# Patient Record
Sex: Male | Born: 1950 | Race: White | Hispanic: No | Marital: Married | Smoking: Never smoker
Health system: Southern US, Community
[De-identification: ages and names within clinical notes are randomized; demographics above are authoritative.]

## PROBLEM LIST (undated history)

## (undated) DIAGNOSIS — H269 Unspecified cataract: Secondary | ICD-10-CM

## (undated) DIAGNOSIS — J302 Other seasonal allergic rhinitis: Secondary | ICD-10-CM

## (undated) DIAGNOSIS — T7840XA Allergy, unspecified, initial encounter: Secondary | ICD-10-CM

## (undated) DIAGNOSIS — Z85528 Personal history of other malignant neoplasm of kidney: Secondary | ICD-10-CM

## (undated) DIAGNOSIS — G4730 Sleep apnea, unspecified: Secondary | ICD-10-CM

## (undated) DIAGNOSIS — M1990 Unspecified osteoarthritis, unspecified site: Secondary | ICD-10-CM

## (undated) DIAGNOSIS — C801 Malignant (primary) neoplasm, unspecified: Secondary | ICD-10-CM

## (undated) DIAGNOSIS — N400 Enlarged prostate without lower urinary tract symptoms: Secondary | ICD-10-CM

## (undated) DIAGNOSIS — N189 Chronic kidney disease, unspecified: Secondary | ICD-10-CM

## (undated) DIAGNOSIS — I10 Essential (primary) hypertension: Secondary | ICD-10-CM

## (undated) DIAGNOSIS — F32A Depression, unspecified: Secondary | ICD-10-CM

## (undated) DIAGNOSIS — K219 Gastro-esophageal reflux disease without esophagitis: Secondary | ICD-10-CM

## (undated) DIAGNOSIS — F419 Anxiety disorder, unspecified: Secondary | ICD-10-CM

## (undated) HISTORY — DX: Anxiety disorder, unspecified: F41.9

## (undated) HISTORY — DX: Personal history of other malignant neoplasm of kidney: Z85.528

## (undated) HISTORY — PX: RENAL CRYOABLATION: SHX2322

## (undated) HISTORY — DX: Malignant (primary) neoplasm, unspecified: C80.1

## (undated) HISTORY — DX: Essential (primary) hypertension: I10

## (undated) HISTORY — DX: Gastro-esophageal reflux disease without esophagitis: K21.9

## (undated) HISTORY — DX: Unspecified osteoarthritis, unspecified site: M19.90

## (undated) HISTORY — PX: TONSILLECTOMY: SUR1361

## (undated) HISTORY — DX: Chronic kidney disease, unspecified: N18.9

## (undated) HISTORY — DX: Other seasonal allergic rhinitis: J30.2

## (undated) HISTORY — DX: Unspecified cataract: H26.9

## (undated) HISTORY — DX: Sleep apnea, unspecified: G47.30

## (undated) HISTORY — DX: Benign prostatic hyperplasia without lower urinary tract symptoms: N40.0

## (undated) HISTORY — DX: Depression, unspecified: F32.A

## (undated) HISTORY — DX: Allergy, unspecified, initial encounter: T78.40XA

---
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
## 2011-04-12 DIAGNOSIS — J4540 Moderate persistent asthma, uncomplicated: Secondary | ICD-10-CM | POA: Insufficient documentation

## 2011-04-12 DIAGNOSIS — R002 Palpitations: Secondary | ICD-10-CM | POA: Insufficient documentation

## 2011-04-12 DIAGNOSIS — I10 Essential (primary) hypertension: Secondary | ICD-10-CM | POA: Insufficient documentation

## 2011-04-27 ENCOUNTER — Encounter: Payer: Self-pay | Admitting: Critical Care Medicine

## 2011-04-28 ENCOUNTER — Ambulatory Visit (INDEPENDENT_AMBULATORY_CARE_PROVIDER_SITE_OTHER)
Admission: RE | Admit: 2011-04-28 | Discharge: 2011-04-28 | Disposition: A | Discharge status: Home / Self Care | Payer: BC Managed Care – PPO | Source: Ambulatory Visit | Attending: Critical Care Medicine | Admitting: Critical Care Medicine

## 2011-04-28 ENCOUNTER — Telehealth: Payer: Self-pay | Admitting: *Deleted

## 2011-04-28 ENCOUNTER — Ambulatory Visit (INDEPENDENT_AMBULATORY_CARE_PROVIDER_SITE_OTHER): Payer: BC Managed Care – PPO | Admitting: Critical Care Medicine

## 2011-04-28 ENCOUNTER — Encounter: Payer: Self-pay | Admitting: Critical Care Medicine

## 2011-04-28 DIAGNOSIS — I10 Essential (primary) hypertension: Secondary | ICD-10-CM

## 2011-04-28 DIAGNOSIS — N400 Enlarged prostate without lower urinary tract symptoms: Secondary | ICD-10-CM

## 2011-04-28 DIAGNOSIS — R911 Solitary pulmonary nodule: Secondary | ICD-10-CM

## 2011-04-28 DIAGNOSIS — J45909 Unspecified asthma, uncomplicated: Secondary | ICD-10-CM

## 2011-04-28 DIAGNOSIS — J302 Other seasonal allergic rhinitis: Secondary | ICD-10-CM | POA: Insufficient documentation

## 2011-04-28 DIAGNOSIS — J309 Allergic rhinitis, unspecified: Secondary | ICD-10-CM

## 2011-04-28 MED ORDER — BUDESONIDE-FORMOTEROL FUMARATE 160-4.5 MCG/ACT IN AERO: 2.0000 | INHALATION_SPRAY | Freq: Two times a day (BID) | RESPIRATORY_TRACT | Status: DC

## 2011-04-28 MED ORDER — LOSARTAN POTASSIUM 50 MG PO TABS: 50.0000 mg | ORAL_TABLET | Freq: Every day | ORAL | Status: DC

## 2011-04-28 NOTE — Telephone Encounter (Signed)
Call report: CXR: Vague nodular opacity in lung bases on lateral image likely left lower lob. Enlarged central pulmonary arteries particularly involving upper lobes bc of these findings; CT of chest w/ contrast is recommended. Please advise Dr. Delford Field, thanks

## 2011-04-28 NOTE — Patient Instructions (Signed)
Chest xray today Pulmonary function studies will be obtained Stop Qvar Start symbicort Stop lisinopril Start losartan Stop fish oil Get your outside records from [place] in [place] Return 6 weeks

## 2011-04-28 NOTE — Progress Notes (Signed)
Subjective:    Patient ID: Jay Fox, male    DOB: 07-Feb-1951, 61 y.o.   MRN: [id number]  HPI Comments: Dx asthma.  First time in early 1990s.  HAd sawdust exposure.  Left job after two years and did better. 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  Then in 2009 moved to [place] asthma was worse. Went to [facility] MD.  Starting 2011: giving albuterol nebs in office.  Then went to pulm in [place]. Cxr neg.  No desat on .  Cleda Daub was abn.  Dx with small airways dz.   Also prior chemical company acid exposure in 1980s.  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 Moved  May 2012 and is better since moved. Has had allergy testing and shots given:  Grasses/ weeds. Now referred d/t in 11/12: started with hard to get air into the chest area.  No relief with inhaler.   Asthma He complains of cough, difficulty breathing, frequent throat clearing, shortness of breath, sputum production and wheezing. There is no chest tightness, hemoptysis or hoarse voice. Primary symptoms comments: Mucus is white. This is a chronic problem. The current episode started more than 1 year ago. The problem occurs every several days. The problem has been gradually worsening. The cough is productive of sputum, dry and hoarse. Associated symptoms include dyspnea on exertion, ear pain, heartburn, postnasal drip and rhinorrhea. Pertinent negatives include no appetite change, chest pain, ear congestion, fever, headaches, myalgias, orthopnea, PND, sneezing, sore throat or trouble swallowing. His symptoms are aggravated by exposure to fumes, exposure to smoke, occupational exposure, pollen, exercise, any activity and change in weather. His symptoms are alleviated by beta-agonist and steroid inhaler. He reports moderate improvement on treatment. His past medical history is significant for asthma. There is no history of bronchiectasis, bronchitis, COPD, emphysema or pneumonia.   Past Medical History  Diagnosis Date  . Asthma   . Hypertension   . BPH (benign  prostatic hypertrophy)   . Seasonal allergies      Family History  Problem Relation Age of Onset  . Emphysema Mother   . Liver cancer Mother   . Pancreatic cancer Mother   . Multiple myeloma Father      History   Social History  . Marital Status: Married    Spouse Name: N/A    Number of Children: 3  . Years of Education: N/A   Occupational History  . Retired    Social History Main Topics  . Smoking status: Never Smoker   . Smokeless tobacco: Never Used  . Alcohol Use: Yes     2 drinks 4-5 times weekly  . Drug Use: No  . Sexually Active: Not on file   Other Topics Concern  . Not on file   Social History Narrative  . No narrative on file     No Known Allergies   Outpatient Prescriptions Prior to Visit  Medication Sig Dispense Refill  . albuterol (PROVENTIL HFA) 108 (90 BASE) MCG/ACT inhaler Inhale 2 puffs into the lungs every 6 (six) hours as needed.      Marland Kitchen amLODipine (NORVASC) 5 MG tablet Take 5 mg by mouth daily.      . clonazePAM (KLONOPIN) 0.5 MG tablet Take 0.5 mg by mouth at bedtime as needed.      . beclomethasone (QVAR) 80 MCG/ACT inhaler Inhale 1 puff into the lungs 2 (two) times daily.       Marland Kitchen lisinopril (PRINIVIL,ZESTRIL) 10 MG tablet Take 10 mg by mouth daily.  Review of Systems  Constitutional: Positive for diaphoresis and fatigue. Negative for fever, chills, activity change, appetite change and unexpected weight change.  HENT: Positive for ear pain, congestion, rhinorrhea, neck pain, neck stiffness, postnasal drip, sinus pressure and tinnitus. Negative for hearing loss, nosebleeds, sore throat, hoarse voice, facial swelling, sneezing, mouth sores, trouble swallowing, dental problem, voice change and ear discharge.   Eyes: Positive for itching. Negative for photophobia, discharge and visual disturbance.  Respiratory: Positive for cough, sputum production, chest tightness, shortness of breath and wheezing. Negative for apnea, hemoptysis,  choking and stridor.   Cardiovascular: Positive for dyspnea on exertion and palpitations. Negative for chest pain, leg swelling and PND.  Gastrointestinal: Positive for heartburn. Negative for nausea, vomiting, abdominal pain, constipation, blood in stool and abdominal distention.  Genitourinary: Positive for urgency, frequency and decreased urine volume. Negative for dysuria, hematuria, flank pain and difficulty urinating.  Musculoskeletal: Positive for back pain and arthralgias. Negative for myalgias and gait problem.  Skin: Negative for color change, pallor and rash.  Neurological: Positive for dizziness and light-headedness. Negative for tremors, seizures, syncope, speech difficulty, weakness, numbness and headaches.  Hematological: Negative for adenopathy. Does not bruise/bleed easily.  Psychiatric/Behavioral: Positive for sleep disturbance and agitation. Negative for confusion. The patient is nervous/anxious.        Objective:   Physical Exam  Filed Vitals:   04/28/11 1148  BP: 144/88  Pulse: 66  Temp: 98.1 F (36.7 C)  TempSrc: Oral  Height: 6\' 10"  (2.083 m)  Weight: 168 lb 12.8 oz (76.567 kg)  SpO2: 99%    Gen: Pleasant, well-nourished, in no distress,  normal affect  ENT: No lesions,  mouth clear,  oropharynx clear, no postnasal drip  Neck: No JVD, no TMG, no carotid bruits  Lungs: No use of accessory muscles, no dullness to percussion, distant BS  Cardiovascular: RRR, heart sounds normal, no murmur or gallops, no peripheral edema  Abdomen: soft and NT, no HSM,  BS normal  Musculoskeletal: No deformities, no cyanosis or clubbing  Neuro: alert, non focal  Skin: Warm, no lesions or rashes  Dg Chest 2 View  04/28/2011  *RADIOLOGY REPORT*  Clinical Data: History of asthma and hypertension, presenting with cough.  CHEST - 2 VIEW 04/28/2011:  Comparison: None.  Findings: Cardiac silhouette normal in size.  Prominent central pulmonary arteries bilaterally, particularly  in the upper lobes. Linear scar or atelectasis in the left lower lobe.  Vague nodular opacity visualized at the base on the lateral image, probably within the left lower lobe on the frontal image.  Lungs otherwise clear.  No pleural effusions.  Degenerative changes involving the mid thoracic spine.  IMPRESSION:  1.  Vague nodular opacity in one of the lung bases on the lateral image, likely the left lower lobe. 2.  Enlarged central pulmonary arteries, particularly involving the upper lobes.  Because of the above findings, CT of the chest with contrast is suggested in further evaluation.  These results will be called to the ordering clinician or representative by the Radiologist Assistant, and communication documented in the PACS Dashboard.  Original Report Authenticated By: Arnell Sieving, M.D.         Assessment & Plan:   Asthma Severe persistent asthma, incomplete database d/t former pulmonary MD in [place] Cyclic cough component d/t to upper airway instability ppt by ACE inhibitor use , fish oil, GERD Plan Pulmonary function studies will be obtained Stop Qvar Start symbicort Stop lisinopril Start losartan Stop fish oil Get your  outside records from [place] in [place] Return 6 weeks    Note Abn CXR , prob confluence vascular markings  ? Why pulm artery dilated  ? pulm HTN  Due to prior living at altitude? Obtain CT chest  May need echo.  Updated Medication List Outpatient Encounter Prescriptions as of 04/28/2011  Medication Sig Dispense Refill  . albuterol (PROVENTIL HFA) 108 (90 BASE) MCG/ACT inhaler Inhale 2 puffs into the lungs every 6 (six) hours as needed.      Marland Kitchen amLODipine (NORVASC) 5 MG tablet Take 5 mg by mouth daily.      . Ascorbic Acid (VITAMIN C PO) Take 1-2 tablets by mouth 2 (two) times daily.      . budesonide-formoterol (SYMBICORT) 160-4.5 MCG/ACT inhaler Inhale 2 puffs into the lungs 2 (two) times daily.  1 Inhaler  12  . Cholecalciferol (VITAMIN D PO) Take 1  tablet by mouth daily.      . clonazePAM (KLONOPIN) 0.5 MG tablet Take 0.5 mg by mouth at bedtime as needed.      . Cyanocobalamin (VITAMIN B-12 PO) Take 1 tablet by mouth daily.      Marland Kitchen losartan (COZAAR) 50 MG tablet Take 1 tablet (50 mg total) by mouth daily.  30 tablet  6  . Melatonin 5 MG TABS Take 1 tablet by mouth at bedtime.      . Multiple Vitamin (MULTIVITAMIN) tablet Take 1 tablet by mouth daily.      . Saw Palmetto 450 MG CAPS Take 3 capsules by mouth 2 (two) times daily.      Marland Kitchen Spacer/Aero-Holding Chambers (AEROCHAMBER MV) inhaler by Other route. Use as instructed      . VALERIAN ROOT PO Take 2 capsules by mouth at bedtime.      Marland Kitchen DISCONTD: beclomethasone (QVAR) 80 MCG/ACT inhaler Inhale 1 puff into the lungs 2 (two) times daily.       Marland Kitchen DISCONTD: lisinopril (PRINIVIL,ZESTRIL) 10 MG tablet Take 10 mg by mouth daily.      Marland Kitchen DISCONTD: Omega-3 Fatty Acids (FISH OIL) 1000 MG CAPS Take 1 capsule by mouth 2 (two) times daily.

## 2011-04-29 ENCOUNTER — Other Ambulatory Visit (INDEPENDENT_AMBULATORY_CARE_PROVIDER_SITE_OTHER): Payer: BC Managed Care – PPO

## 2011-04-29 DIAGNOSIS — J45909 Unspecified asthma, uncomplicated: Secondary | ICD-10-CM

## 2011-04-29 LAB — BASIC METABOLIC PANEL
BUN: 17 mg/dL (ref 6–23)
Chloride: 106 mEq/L (ref 96–112)
Creatinine, Ser: 1.1 mg/dL (ref 0.4–1.5)
Glucose, Bld: 83 mg/dL (ref 70–99)

## 2011-04-29 NOTE — Telephone Encounter (Signed)
Called, spoke with pt.  I informed him of CXR results and recs per PW. He verbalized understanding of this and is aware he will receive another call regarding the date/time of the CT Chest.    He asked I speak with wife regarding this as they have recently changed insurance companies and would like further information so they could call the insurance co to see how much they are going to have to pay for this. She would like the diagnosis code we are going to use - I showed the cxr report to Dr. Maple Hudson to see which code we should use as asthma is the only pulmonary dx in chart  Per CDY, use lung nodule.  I informed wife we will order this under the 793.11 dx code.    BMET and CT Chest orders have been placed.

## 2011-04-29 NOTE — Telephone Encounter (Signed)
Call pt and tell him there is a density in his Left lung, may just be blood vessels and we need to do a CT Chest Needs CT Chest with contrast  Check BMET

## 2011-04-30 NOTE — Assessment & Plan Note (Addendum)
Severe persistent asthma, incomplete database d/t former pulmonary MD in [place] Cyclic cough component d/t to upper airway instability ppt by ACE inhibitor use , fish oil, GERD Plan Pulmonary function studies will be obtained Stop Qvar Start symbicort Stop lisinopril Start losartan Stop fish oil Get your outside records from [place] in [place] Return 6 weeks

## 2011-05-04 ENCOUNTER — Ambulatory Visit (INDEPENDENT_AMBULATORY_CARE_PROVIDER_SITE_OTHER)
Admission: RE | Admit: 2011-05-04 | Discharge: 2011-05-04 | Disposition: A | Discharge status: Home / Self Care | Payer: BC Managed Care – PPO | Source: Ambulatory Visit | Attending: Critical Care Medicine | Admitting: Critical Care Medicine

## 2011-05-04 DIAGNOSIS — R911 Solitary pulmonary nodule: Secondary | ICD-10-CM

## 2011-05-04 MED ORDER — IOHEXOL 300 MG/ML  SOLN
80.0000 mL | Freq: Once | INTRAMUSCULAR | Status: AC | PRN
  Administered 2011-05-04: 80 mL via INTRAVENOUS

## 2011-05-06 ENCOUNTER — Telehealth: Payer: Self-pay | Admitting: *Deleted

## 2011-05-06 NOTE — Telephone Encounter (Signed)
Called, spoke with pt.  PFT was rescheduled for May 11, 2011 at 1 pm -- pt aware and will call back if this does not work.  Advised to please make sure, if he does have to reschedule it, that it will be within the next few weeks.  He verbalized understanding of this.

## 2011-05-06 NOTE — Telephone Encounter (Signed)
Message copied by Valentino Hue on Thu May 06, 2011 11:14 AM ------      Message from: Storm Frisk      Created: Tue May 04, 2011  2:41 PM       Leisel Pinette            This pts pfts not until April            This needs to be moved up sooner .   In next few weeks            pw

## 2011-05-11 ENCOUNTER — Ambulatory Visit (INDEPENDENT_AMBULATORY_CARE_PROVIDER_SITE_OTHER): Payer: BC Managed Care – PPO | Admitting: Critical Care Medicine

## 2011-05-11 ENCOUNTER — Telehealth: Payer: Self-pay | Admitting: Critical Care Medicine

## 2011-05-11 DIAGNOSIS — J45909 Unspecified asthma, uncomplicated: Secondary | ICD-10-CM

## 2011-05-11 LAB — PULMONARY FUNCTION TEST

## 2011-05-11 NOTE — Telephone Encounter (Signed)
I will have to call him sometime tomorrow I have not yet reviewed the studies

## 2011-05-11 NOTE — Telephone Encounter (Signed)
Spoke with pt and notified that results not yet reviewed yet, and that PW will call him sometime 05/12/11.

## 2011-05-11 NOTE — Telephone Encounter (Signed)
PFT results discussed with the patient

## 2011-05-11 NOTE — Telephone Encounter (Signed)
I spoke with pt and he is requesting his PFT results from today. I advised him will forward to Dr. Delford Field advising him of this. Please advise Dr. Delford Field, thanks

## 2011-05-11 NOTE — Progress Notes (Signed)
PFT done today. 

## 2011-05-12 ENCOUNTER — Encounter: Payer: Self-pay | Admitting: Critical Care Medicine

## 2011-05-21 ENCOUNTER — Encounter: Payer: Self-pay | Admitting: Critical Care Medicine

## 2011-06-15 ENCOUNTER — Ambulatory Visit: Payer: BC Managed Care – PPO | Admitting: Critical Care Medicine

## 2011-09-23 ENCOUNTER — Other Ambulatory Visit: Payer: Self-pay | Admitting: Critical Care Medicine

## 2011-09-24 NOTE — Telephone Encounter (Signed)
At last OV with Dr. Delford Field on 04/28/11, lisinopril was d/c'd.

## 2012-05-15 DIAGNOSIS — R32 Unspecified urinary incontinence: Secondary | ICD-10-CM | POA: Insufficient documentation

## 2012-05-15 DIAGNOSIS — L989 Disorder of the skin and subcutaneous tissue, unspecified: Secondary | ICD-10-CM | POA: Insufficient documentation

## 2012-09-08 DIAGNOSIS — G4700 Insomnia, unspecified: Secondary | ICD-10-CM | POA: Insufficient documentation

## 2017-03-15 HISTORY — PX: NASAL SINUS SURGERY: SHX719

## 2017-11-21 DIAGNOSIS — C61 Malignant neoplasm of prostate: Secondary | ICD-10-CM | POA: Insufficient documentation

## 2019-02-22 DIAGNOSIS — Z85528 Personal history of other malignant neoplasm of kidney: Secondary | ICD-10-CM | POA: Insufficient documentation

## 2019-12-17 ENCOUNTER — Other Ambulatory Visit: Payer: Self-pay

## 2019-12-17 ENCOUNTER — Ambulatory Visit
Admission: RE | Admit: 2019-12-17 | Discharge: 2019-12-17 | Disposition: A | Discharge status: Home / Self Care | Payer: Medicare Other | Source: Ambulatory Visit | Attending: Nurse Practitioner | Admitting: Nurse Practitioner

## 2019-12-17 ENCOUNTER — Other Ambulatory Visit: Payer: Self-pay | Admitting: Nurse Practitioner

## 2019-12-17 DIAGNOSIS — M25561 Pain in right knee: Secondary | ICD-10-CM

## 2020-04-12 DIAGNOSIS — G2581 Restless legs syndrome: Secondary | ICD-10-CM | POA: Diagnosis not present

## 2020-04-12 DIAGNOSIS — I10 Essential (primary) hypertension: Secondary | ICD-10-CM | POA: Diagnosis not present

## 2020-04-29 DIAGNOSIS — H2513 Age-related nuclear cataract, bilateral: Secondary | ICD-10-CM | POA: Diagnosis not present

## 2020-04-29 DIAGNOSIS — H33002 Unspecified retinal detachment with retinal break, left eye: Secondary | ICD-10-CM | POA: Diagnosis not present

## 2020-04-30 DIAGNOSIS — N5231 Erectile dysfunction following radical prostatectomy: Secondary | ICD-10-CM | POA: Diagnosis not present

## 2020-04-30 DIAGNOSIS — C61 Malignant neoplasm of prostate: Secondary | ICD-10-CM | POA: Diagnosis not present

## 2020-04-30 DIAGNOSIS — C642 Malignant neoplasm of left kidney, except renal pelvis: Secondary | ICD-10-CM | POA: Diagnosis not present

## 2020-04-30 DIAGNOSIS — N393 Stress incontinence (female) (male): Secondary | ICD-10-CM | POA: Diagnosis not present

## 2020-05-14 DIAGNOSIS — Z01812 Encounter for preprocedural laboratory examination: Secondary | ICD-10-CM | POA: Diagnosis not present

## 2020-05-15 DIAGNOSIS — K5730 Diverticulosis of large intestine without perforation or abscess without bleeding: Secondary | ICD-10-CM | POA: Diagnosis not present

## 2020-05-15 DIAGNOSIS — C642 Malignant neoplasm of left kidney, except renal pelvis: Secondary | ICD-10-CM | POA: Diagnosis not present

## 2020-05-19 DIAGNOSIS — K625 Hemorrhage of anus and rectum: Secondary | ICD-10-CM | POA: Diagnosis not present

## 2020-05-19 DIAGNOSIS — K635 Polyp of colon: Secondary | ICD-10-CM | POA: Diagnosis not present

## 2020-05-19 DIAGNOSIS — K5730 Diverticulosis of large intestine without perforation or abscess without bleeding: Secondary | ICD-10-CM | POA: Diagnosis not present

## 2020-05-19 DIAGNOSIS — K640 First degree hemorrhoids: Secondary | ICD-10-CM | POA: Diagnosis not present

## 2020-05-22 DIAGNOSIS — C61 Malignant neoplasm of prostate: Secondary | ICD-10-CM | POA: Diagnosis not present

## 2020-05-22 DIAGNOSIS — G2581 Restless legs syndrome: Secondary | ICD-10-CM | POA: Diagnosis not present

## 2020-05-22 DIAGNOSIS — N281 Cyst of kidney, acquired: Secondary | ICD-10-CM | POA: Diagnosis not present

## 2020-05-22 DIAGNOSIS — I10 Essential (primary) hypertension: Secondary | ICD-10-CM | POA: Diagnosis not present

## 2020-05-22 DIAGNOSIS — F411 Generalized anxiety disorder: Secondary | ICD-10-CM | POA: Diagnosis not present

## 2020-05-23 DIAGNOSIS — K635 Polyp of colon: Secondary | ICD-10-CM | POA: Diagnosis not present

## 2020-06-02 DIAGNOSIS — M25561 Pain in right knee: Secondary | ICD-10-CM | POA: Diagnosis not present

## 2020-06-12 DIAGNOSIS — G2581 Restless legs syndrome: Secondary | ICD-10-CM | POA: Diagnosis not present

## 2020-06-12 DIAGNOSIS — I10 Essential (primary) hypertension: Secondary | ICD-10-CM | POA: Diagnosis not present

## 2020-06-24 DIAGNOSIS — M6281 Muscle weakness (generalized): Secondary | ICD-10-CM | POA: Diagnosis not present

## 2020-06-24 DIAGNOSIS — R262 Difficulty in walking, not elsewhere classified: Secondary | ICD-10-CM | POA: Diagnosis not present

## 2020-06-24 DIAGNOSIS — M1711 Unilateral primary osteoarthritis, right knee: Secondary | ICD-10-CM | POA: Diagnosis not present

## 2020-06-24 DIAGNOSIS — M25561 Pain in right knee: Secondary | ICD-10-CM | POA: Diagnosis not present

## 2020-07-01 DIAGNOSIS — M1711 Unilateral primary osteoarthritis, right knee: Secondary | ICD-10-CM | POA: Diagnosis not present

## 2020-07-01 DIAGNOSIS — M25561 Pain in right knee: Secondary | ICD-10-CM | POA: Diagnosis not present

## 2020-07-01 DIAGNOSIS — R262 Difficulty in walking, not elsewhere classified: Secondary | ICD-10-CM | POA: Diagnosis not present

## 2020-07-01 DIAGNOSIS — M6281 Muscle weakness (generalized): Secondary | ICD-10-CM | POA: Diagnosis not present

## 2020-07-02 DIAGNOSIS — G4730 Sleep apnea, unspecified: Secondary | ICD-10-CM | POA: Diagnosis not present

## 2020-07-03 DIAGNOSIS — G4730 Sleep apnea, unspecified: Secondary | ICD-10-CM | POA: Diagnosis not present

## 2020-07-08 DIAGNOSIS — R262 Difficulty in walking, not elsewhere classified: Secondary | ICD-10-CM | POA: Diagnosis not present

## 2020-07-08 DIAGNOSIS — M1711 Unilateral primary osteoarthritis, right knee: Secondary | ICD-10-CM | POA: Diagnosis not present

## 2020-07-08 DIAGNOSIS — M6281 Muscle weakness (generalized): Secondary | ICD-10-CM | POA: Diagnosis not present

## 2020-07-16 DIAGNOSIS — M1711 Unilateral primary osteoarthritis, right knee: Secondary | ICD-10-CM | POA: Diagnosis not present

## 2020-07-16 DIAGNOSIS — R262 Difficulty in walking, not elsewhere classified: Secondary | ICD-10-CM | POA: Diagnosis not present

## 2020-07-16 DIAGNOSIS — M25561 Pain in right knee: Secondary | ICD-10-CM | POA: Diagnosis not present

## 2020-07-16 DIAGNOSIS — M6281 Muscle weakness (generalized): Secondary | ICD-10-CM | POA: Diagnosis not present

## 2020-09-11 DIAGNOSIS — G2581 Restless legs syndrome: Secondary | ICD-10-CM | POA: Diagnosis not present

## 2020-09-11 DIAGNOSIS — I10 Essential (primary) hypertension: Secondary | ICD-10-CM | POA: Diagnosis not present

## 2020-09-22 DIAGNOSIS — F518 Other sleep disorders not due to a substance or known physiological condition: Secondary | ICD-10-CM | POA: Diagnosis not present

## 2020-09-22 DIAGNOSIS — F411 Generalized anxiety disorder: Secondary | ICD-10-CM | POA: Diagnosis not present

## 2020-09-22 DIAGNOSIS — Z13220 Encounter for screening for lipoid disorders: Secondary | ICD-10-CM | POA: Diagnosis not present

## 2020-09-22 DIAGNOSIS — G2581 Restless legs syndrome: Secondary | ICD-10-CM | POA: Diagnosis not present

## 2020-09-22 DIAGNOSIS — I10 Essential (primary) hypertension: Secondary | ICD-10-CM | POA: Diagnosis not present

## 2020-10-12 DIAGNOSIS — G2581 Restless legs syndrome: Secondary | ICD-10-CM | POA: Diagnosis not present

## 2020-10-12 DIAGNOSIS — I10 Essential (primary) hypertension: Secondary | ICD-10-CM | POA: Diagnosis not present

## 2020-10-13 DIAGNOSIS — F518 Other sleep disorders not due to a substance or known physiological condition: Secondary | ICD-10-CM | POA: Diagnosis not present

## 2020-10-13 DIAGNOSIS — I10 Essential (primary) hypertension: Secondary | ICD-10-CM | POA: Diagnosis not present

## 2020-10-13 DIAGNOSIS — F411 Generalized anxiety disorder: Secondary | ICD-10-CM | POA: Diagnosis not present

## 2020-10-13 DIAGNOSIS — G2581 Restless legs syndrome: Secondary | ICD-10-CM | POA: Diagnosis not present

## 2020-10-20 DIAGNOSIS — D1801 Hemangioma of skin and subcutaneous tissue: Secondary | ICD-10-CM | POA: Diagnosis not present

## 2020-10-20 DIAGNOSIS — L905 Scar conditions and fibrosis of skin: Secondary | ICD-10-CM | POA: Diagnosis not present

## 2020-10-20 DIAGNOSIS — L578 Other skin changes due to chronic exposure to nonionizing radiation: Secondary | ICD-10-CM | POA: Diagnosis not present

## 2020-10-20 DIAGNOSIS — L821 Other seborrheic keratosis: Secondary | ICD-10-CM | POA: Diagnosis not present

## 2020-10-28 DIAGNOSIS — C61 Malignant neoplasm of prostate: Secondary | ICD-10-CM | POA: Diagnosis not present

## 2020-11-03 DIAGNOSIS — N281 Cyst of kidney, acquired: Secondary | ICD-10-CM | POA: Diagnosis not present

## 2020-11-03 DIAGNOSIS — N200 Calculus of kidney: Secondary | ICD-10-CM | POA: Diagnosis not present

## 2020-11-03 DIAGNOSIS — C642 Malignant neoplasm of left kidney, except renal pelvis: Secondary | ICD-10-CM | POA: Diagnosis not present

## 2020-11-03 DIAGNOSIS — K5730 Diverticulosis of large intestine without perforation or abscess without bleeding: Secondary | ICD-10-CM | POA: Diagnosis not present

## 2020-11-12 DIAGNOSIS — C642 Malignant neoplasm of left kidney, except renal pelvis: Secondary | ICD-10-CM | POA: Diagnosis not present

## 2020-11-12 DIAGNOSIS — N393 Stress incontinence (female) (male): Secondary | ICD-10-CM | POA: Diagnosis not present

## 2020-11-12 DIAGNOSIS — C61 Malignant neoplasm of prostate: Secondary | ICD-10-CM | POA: Diagnosis not present

## 2020-11-14 DIAGNOSIS — I10 Essential (primary) hypertension: Secondary | ICD-10-CM | POA: Diagnosis not present

## 2020-11-14 DIAGNOSIS — F411 Generalized anxiety disorder: Secondary | ICD-10-CM | POA: Diagnosis not present

## 2020-11-14 DIAGNOSIS — G2581 Restless legs syndrome: Secondary | ICD-10-CM | POA: Diagnosis not present

## 2020-11-14 DIAGNOSIS — G4733 Obstructive sleep apnea (adult) (pediatric): Secondary | ICD-10-CM | POA: Diagnosis not present

## 2020-11-21 ENCOUNTER — Telehealth (HOSPITAL_COMMUNITY): Payer: Self-pay

## 2020-11-21 NOTE — Telephone Encounter (Signed)
Called patient to see if he is interested in the Pulmonary Rehab Program. Patient expressed interest. Explained scheduling process, patient verbalized understanding. Also adv pt where we are with scheduling for PR and that we have a backlog 1-3 months.

## 2020-11-24 ENCOUNTER — Encounter (HOSPITAL_COMMUNITY): Payer: Self-pay | Admitting: *Deleted

## 2020-11-24 NOTE — Progress Notes (Signed)
Received referral from Dr. Criss Rosales for this pt to participate in pulmonary rehab with the the diagnosis of Sleep Apnea unspecified. Pt completed sleep study outside of [facility].  Asked  for copy of this sleep study. Clinical review of pt follow up appt on 9/2. PCP office note. Pt with Covid Risk Score - 4. Pt appropriate for scheduling for Pulmonary rehab.  Will forward to support staff for scheduling and verification of insurance eligibility/benefits with pt consent. Cherre Huger, BSN Cardiac and Training and development officer

## 2020-12-01 ENCOUNTER — Telehealth (HOSPITAL_COMMUNITY): Payer: Self-pay | Admitting: Family Medicine

## 2020-12-02 ENCOUNTER — Telehealth (HOSPITAL_COMMUNITY): Payer: Self-pay

## 2020-12-02 NOTE — Telephone Encounter (Signed)
Returned pt phone call in regards to pulmonary rehab. I advised pt that right now we have a backlog with pulmonary rehab between 1-3 months and that we would give him a call at a later time to schedule.

## 2020-12-19 NOTE — Telephone Encounter (Signed)
Pt insurance is active and benefits verified through [facility]. Co-pay $30.00, DED $0.00/$0.00 met, out of pocket $4,200.00/$696.46 met, co-insurance 0%. No pre-authorization required. Mark/BCBS Medicare, 12/18/20 @ 332PM, REF#[id number]   Will contact patient to see if he is interested in the Pulmonary Rehab Program.

## 2020-12-19 NOTE — Telephone Encounter (Signed)
Called patient to see if he is interested in the Pulmonary Rehab Program. Patient expressed interest. Explained scheduling process and went over insurance, patient verbalized understanding. Also adv pt where we are with scheduling for PR and that we have a back log. (1-4 months) 

## 2020-12-31 DIAGNOSIS — C642 Malignant neoplasm of left kidney, except renal pelvis: Secondary | ICD-10-CM | POA: Diagnosis not present

## 2020-12-31 DIAGNOSIS — M545 Low back pain, unspecified: Secondary | ICD-10-CM | POA: Diagnosis not present

## 2020-12-31 DIAGNOSIS — F411 Generalized anxiety disorder: Secondary | ICD-10-CM | POA: Diagnosis not present

## 2020-12-31 DIAGNOSIS — I10 Essential (primary) hypertension: Secondary | ICD-10-CM | POA: Diagnosis not present

## 2021-01-01 ENCOUNTER — Other Ambulatory Visit: Payer: Self-pay | Admitting: Family Medicine

## 2021-01-01 DIAGNOSIS — N289 Disorder of kidney and ureter, unspecified: Secondary | ICD-10-CM

## 2021-01-01 DIAGNOSIS — M545 Low back pain, unspecified: Secondary | ICD-10-CM

## 2021-01-01 DIAGNOSIS — C642 Malignant neoplasm of left kidney, except renal pelvis: Secondary | ICD-10-CM

## 2021-01-12 DIAGNOSIS — G2581 Restless legs syndrome: Secondary | ICD-10-CM | POA: Diagnosis not present

## 2021-01-12 DIAGNOSIS — I10 Essential (primary) hypertension: Secondary | ICD-10-CM | POA: Diagnosis not present

## 2021-01-14 ENCOUNTER — Ambulatory Visit
Admission: RE | Admit: 2021-01-14 | Discharge: 2021-01-14 | Disposition: A | Discharge status: Home / Self Care | Payer: Medicare Other | Source: Ambulatory Visit | Attending: Family Medicine | Admitting: Family Medicine

## 2021-01-14 DIAGNOSIS — M545 Low back pain, unspecified: Secondary | ICD-10-CM

## 2021-01-14 DIAGNOSIS — N289 Disorder of kidney and ureter, unspecified: Secondary | ICD-10-CM

## 2021-01-14 DIAGNOSIS — K7689 Other specified diseases of liver: Secondary | ICD-10-CM | POA: Diagnosis not present

## 2021-01-14 DIAGNOSIS — C642 Malignant neoplasm of left kidney, except renal pelvis: Secondary | ICD-10-CM

## 2021-02-13 DIAGNOSIS — I10 Essential (primary) hypertension: Secondary | ICD-10-CM | POA: Diagnosis not present

## 2021-02-13 DIAGNOSIS — F411 Generalized anxiety disorder: Secondary | ICD-10-CM | POA: Diagnosis not present

## 2021-02-13 DIAGNOSIS — E518 Other manifestations of thiamine deficiency: Secondary | ICD-10-CM | POA: Diagnosis not present

## 2021-04-02 ENCOUNTER — Telehealth (HOSPITAL_COMMUNITY): Payer: Self-pay

## 2021-04-02 NOTE — Telephone Encounter (Signed)
Reached out to Dr. Fransico Setters office and spoke to a receptionist to see about getting pt sleep study results faxed over before pt is scheduled for pulmonary rehab. She gathered information and stated that she will pass the information over to a nurse and the nurse will get back with me in regards to faxing the sleep study results over

## 2021-05-06 DIAGNOSIS — C61 Malignant neoplasm of prostate: Secondary | ICD-10-CM | POA: Diagnosis not present

## 2021-05-13 DIAGNOSIS — C642 Malignant neoplasm of left kidney, except renal pelvis: Secondary | ICD-10-CM | POA: Diagnosis not present

## 2021-05-13 DIAGNOSIS — C61 Malignant neoplasm of prostate: Secondary | ICD-10-CM | POA: Diagnosis not present

## 2021-05-14 ENCOUNTER — Ambulatory Visit (INDEPENDENT_AMBULATORY_CARE_PROVIDER_SITE_OTHER): Payer: Medicare Other | Admitting: Registered Nurse

## 2021-05-14 ENCOUNTER — Other Ambulatory Visit: Payer: Self-pay

## 2021-05-14 ENCOUNTER — Encounter: Payer: Self-pay | Admitting: Registered Nurse

## 2021-05-14 VITALS — BP 134/68 | HR 62 | Temp 97.9°F | Resp 18 | Ht 70.0 in | Wt 182.8 lb

## 2021-05-14 DIAGNOSIS — Z1159 Encounter for screening for other viral diseases: Secondary | ICD-10-CM | POA: Diagnosis not present

## 2021-05-14 DIAGNOSIS — Z13220 Encounter for screening for lipoid disorders: Secondary | ICD-10-CM

## 2021-05-14 DIAGNOSIS — Z130 Encounter for screening for diseases of the blood and blood-forming organs and certain disorders involving the immune mechanism: Secondary | ICD-10-CM

## 2021-05-14 DIAGNOSIS — J4540 Moderate persistent asthma, uncomplicated: Secondary | ICD-10-CM | POA: Diagnosis not present

## 2021-05-14 DIAGNOSIS — G8929 Other chronic pain: Secondary | ICD-10-CM | POA: Diagnosis not present

## 2021-05-14 DIAGNOSIS — Z1329 Encounter for screening for other suspected endocrine disorder: Secondary | ICD-10-CM

## 2021-05-14 DIAGNOSIS — J45909 Unspecified asthma, uncomplicated: Secondary | ICD-10-CM

## 2021-05-14 DIAGNOSIS — M25561 Pain in right knee: Secondary | ICD-10-CM | POA: Diagnosis not present

## 2021-05-14 DIAGNOSIS — J4541 Moderate persistent asthma with (acute) exacerbation: Secondary | ICD-10-CM

## 2021-05-14 DIAGNOSIS — J302 Other seasonal allergic rhinitis: Secondary | ICD-10-CM | POA: Diagnosis not present

## 2021-05-14 DIAGNOSIS — Z13228 Encounter for screening for other metabolic disorders: Secondary | ICD-10-CM

## 2021-05-14 LAB — COMPREHENSIVE METABOLIC PANEL
ALT: 23 U/L (ref 0–53)
AST: 18 U/L (ref 0–37)
Albumin: 4.6 g/dL (ref 3.5–5.2)
Alkaline Phosphatase: 73 U/L (ref 39–117)
BUN: 21 mg/dL (ref 6–23)
CO2: 25 mEq/L (ref 19–32)
Calcium: 9.6 mg/dL (ref 8.4–10.5)
Chloride: 103 mEq/L (ref 96–112)
Creatinine, Ser: 1.03 mg/dL (ref 0.40–1.50)
GFR: 73.94 mL/min (ref >60.00)
Glucose, Bld: 103 mg/dL — ABNORMAL HIGH (ref 70–99)
Potassium: 4.4 mEq/L (ref 3.5–5.1)
Sodium: 137 mEq/L (ref 135–145)
Total Bilirubin: 0.6 mg/dL (ref 0.2–1.2)
Total Protein: 6.9 g/dL (ref 6.0–8.3)

## 2021-05-14 LAB — CBC WITH DIFFERENTIAL/PLATELET
Basophils Absolute: 0.1 10*3/uL (ref 0.0–0.1)
Basophils Relative: 1.3 % (ref 0.0–3.0)
Eosinophils Absolute: 0.3 10*3/uL (ref 0.0–0.7)
Eosinophils Relative: 6.2 % — ABNORMAL HIGH (ref 0.0–5.0)
HCT: 41.7 % (ref 39.0–52.0)
Hemoglobin: 14.2 g/dL (ref 13.0–17.0)
Lymphocytes Relative: 23.5 % (ref 12.0–46.0)
Lymphs Abs: 1.2 10*3/uL (ref 0.7–4.0)
MCHC: 34 g/dL (ref 30.0–36.0)
MCV: 88.3 fl (ref 78.0–100.0)
Monocytes Absolute: 0.5 10*3/uL (ref 0.1–1.0)
Monocytes Relative: 10.2 % (ref 3.0–12.0)
Neutro Abs: 3.1 10*3/uL (ref 1.4–7.7)
Neutrophils Relative %: 58.8 % (ref 43.0–77.0)
Platelets: 227 10*3/uL (ref 150.0–400.0)
RBC: 4.72 Mil/uL (ref 4.22–5.81)
RDW: 13.9 % (ref 11.5–15.5)
WBC: 5.3 10*3/uL (ref 4.0–10.5)

## 2021-05-14 LAB — LIPID PANEL
Cholesterol: 169 mg/dL (ref 0–200)
HDL: 59.4 mg/dL (ref >39.00)
LDL Cholesterol: 73 mg/dL (ref 0–99)
NonHDL: 109.23
Total CHOL/HDL Ratio: 3
Triglycerides: 182 mg/dL — ABNORMAL HIGH (ref 0.0–149.0)
VLDL: 36.4 mg/dL (ref 0.0–40.0)

## 2021-05-14 LAB — HEMOGLOBIN A1C: Hgb A1c MFr Bld: 5.9 % (ref 4.6–6.5)

## 2021-05-14 MED ORDER — PREDNISONE 10 MG (21) PO TBPK: ORAL_TABLET | ORAL | 0 refills | Status: DC

## 2021-05-14 MED ORDER — ALBUTEROL SULFATE HFA 108 (90 BASE) MCG/ACT IN AERS: 1.0000 | INHALATION_SPRAY | Freq: Four times a day (QID) | RESPIRATORY_TRACT | 11 refills | Status: DC | PRN

## 2021-05-14 MED ORDER — DOXYCYCLINE HYCLATE 100 MG PO TABS: 100.0000 mg | ORAL_TABLET | Freq: Two times a day (BID) | ORAL | 0 refills | Status: DC

## 2021-05-14 MED ORDER — AEROCHAMBER MV MISC: 0 refills | Status: AC

## 2021-05-14 MED ORDER — ALBUTEROL SULFATE (2.5 MG/3ML) 0.083% IN NEBU: 2.5000 mg | INHALATION_SOLUTION | Freq: Four times a day (QID) | RESPIRATORY_TRACT | 1 refills | Status: DC | PRN

## 2021-05-14 NOTE — Patient Instructions (Addendum)
Mr. Foiles -  ? ?Great to meet you ? ?Doxycycline and prednisone for breathing. Finish entire course even if feeling better. ?Have refilled albuterol nebulizer and inhalers  ? ?I'll let you know how today's labs look ? ?Call if you need anything ? ?Thanks, ? ?Rich  ? ? ? ? ?If you have lab work done today you will be contacted with your lab results within the next 2 weeks.  If you have not heard from [place] then please contact us. The fastest way to get your results is to register for My Chart. ? ? ?IF you received an x-ray today, you will receive an invoice from [facility]. Please contact [facility] at [phone] with questions or concerns regarding your invoice.  ? ?IF you received labwork today, you will receive an invoice from [facility]. Please contact [facility] at [phone] with questions or concerns regarding your invoice.  ? ?Our billing staff will not be able to assist you with questions regarding bills from these companies. ? ?You will be contacted with the lab results as soon as they are available. The fastest way to get your results is to activate your My Chart account. Instructions are located on the last page of this paperwork. If you have not heard from [place] regarding the results in 2 weeks, please contact this office. ?  ? ? ?

## 2021-05-14 NOTE — Progress Notes (Signed)
? ?New Patient Office Visit ? ?Subjective:  ?Patient ID: Jay Fox, male    DOB: 1950/04/16  Age: 71 y.o. MRN: [id number] ? ?CC:  ?Chief Complaint  ?Patient presents with  ? New Patient (Initial Visit)  ?  Patient states he is here to establish care. Patient is also having some coughing for his asthma  ? ? ?HPI ?Jay Fox presents for visit to est care ? ?Histories reviewed and updated with patient.  ? ?Notes concern for asthma ?Longstanding hx. Has used symbicort with good effect.  ?Needs refill on albuterol inhaler and nebulizer. Uses this intermittently.  ?Did have apparent viral infection in January, mostly resolved, but still coughing with productive green mucus. ?Notes symbicort does not seem as effective since ?No other symptoms ? ?Knee pain ?Right knee ?Aching ?Has rec'd cortisone shot about 1 year ago from Dr. Percell Miller ?Has done PT ? ?Past Medical History:  ?Diagnosis Date  ? Allergy 1954  ? Anxiety   ? Arthritis   ? Asthma   ? BPH (benign prostatic hypertrophy)   ? Cancer [facility])   ? Cataract   ? Chronic kidney disease   ? Depression   ? GERD (gastroesophageal reflux disease)   ? History of kidney cancer   ? Hypertension   ? Seasonal allergies   ? Sleep apnea   ? ? ?Past Surgical History:  ?Procedure Laterality Date  ? NASAL SINUS SURGERY  2019  ? RENAL CRYOABLATION    ? TONSILLECTOMY    ? ? ?Family History  ?Problem Relation Age of Onset  ? Emphysema Mother   ? Liver cancer Mother   ? Pancreatic cancer Mother   ? Anxiety disorder Mother   ? Cancer Mother   ? Hypertension Mother   ? Multiple myeloma Father   ? Cancer Father   ? Hypertension Father   ? Colon cancer Paternal Grandmother   ? Colon cancer Paternal Grandfather   ? ? ?Social History  ? ?Socioeconomic History  ? Marital status: Married  ?  Spouse name: Not on file  ? Number of children: 3  ? Years of education: Not on file  ? Highest education level: Not on file  ?Occupational History  ? Occupation: Retired  ?Tobacco Use  ? Smoking status:  Never  ? Smokeless tobacco: Never  ?Vaping Use  ? Vaping Use: Never used  ?Substance and Sexual Activity  ? Alcohol use: Not Currently  ?  Alcohol/week: 10.0 standard drinks  ?  Types: 10 Standard drinks or equivalent per week  ?  Comment: Varies sometimes not at all  ? Drug use: No  ? Sexual activity: Not Currently  ?Other Topics Concern  ? Not on file  ?Social History Narrative  ? Not on file  ? ?Social Determinants of Health  ? ?Financial Resource Strain: Not on file  ?Food Insecurity: Not on file  ?Transportation Needs: Not on file  ?Physical Activity: Not on file  ?Stress: Not on file  ?Social Connections: Not on file  ?Intimate Partner Violence: Not on file  ? ? ?ROS ?Review of Systems  ?Constitutional: Negative.   ?HENT: Negative.    ?Eyes: Negative.   ?Respiratory:  Positive for cough and shortness of breath.   ?Cardiovascular: Negative.   ?Gastrointestinal: Negative.   ?Genitourinary: Negative.   ?Musculoskeletal:  Positive for arthralgias.  ?Skin: Negative.   ?Neurological: Negative.   ?Psychiatric/Behavioral: Negative.    ? ?Objective:  ? ?Today's Vitals: BP 134/68   Pulse 62   Temp 97.9 ?  F (36.6 ?C) (Temporal)   Resp 18   Ht _0  (1.778 m)   Wt 182 lb 12.8 oz (82.9 kg)   SpO2 96%   BMI 26.23 kg/m?  ? ?Physical Exam ?Vitals and nursing note reviewed.  ?Constitutional:   ?   Appearance: Normal appearance.  ?Cardiovascular:  ?   Rate and Rhythm: Normal rate and regular rhythm.  ?   Pulses: Normal pulses.  ?   Heart sounds: Normal heart sounds. No murmur heard. ?  No friction rub. No gallop.  ?Pulmonary:  ?   Effort: Pulmonary effort is normal. No respiratory distress.  ?   Breath sounds: No stridor. Wheezing present. No rhonchi or rales.  ?Neurological:  ?   General: No focal deficit present.  ?   Mental Status: He is alert. Mental status is at baseline.  ?Psychiatric:     ?   Mood and Affect: Mood normal.     ?   Behavior: Behavior normal.     ?   Thought Content: Thought content normal.     ?    Judgment: Judgment normal.  ? ? ?Assessment & Plan:  ? ?Problem List Items Addressed This Visit   ? ?  ? Other  ? Seasonal allergies  ? Relevant Medications  ? albuterol (PROVENTIL) (2.5 MG/3ML) 0.083% nebulizer solution  ? doxycycline (VIBRA-TABS) 100 MG tablet  ? ?Other Visit Diagnoses   ? ? Moderate persistent asthma without complication    -  Primary  ? Relevant Medications  ? albuterol (VENTOLIN HFA) 108 (90 Base) MCG/ACT inhaler  ? Spacer/Aero-Holding Chambers (AEROCHAMBER MV) inhaler  ? albuterol (PROVENTIL) (2.5 MG/3ML) 0.083% nebulizer solution  ? predniSONE (STERAPRED UNI-PAK 21 TAB) 10 MG (21) TBPK tablet  ? doxycycline (VIBRA-TABS) 100 MG tablet  ? Acute asthma      ? Relevant Medications  ? albuterol (VENTOLIN HFA) 108 (90 Base) MCG/ACT inhaler  ? albuterol (PROVENTIL) (2.5 MG/3ML) 0.083% nebulizer solution  ? predniSONE (STERAPRED UNI-PAK 21 TAB) 10 MG (21) TBPK tablet  ? doxycycline (VIBRA-TABS) 100 MG tablet  ? Screening for endocrine, metabolic and immunity disorder      ? Relevant Orders  ? CBC with Differential/Platelet  ? Comprehensive metabolic panel  ? Hemoglobin A1c  ? Lipid screening      ? Relevant Orders  ? Lipid panel  ? Encounter for screening for other viral diseases      ? Relevant Orders  ? Hepatitis C antibody  ? Chronic pain of right knee      ? ?  ? ? ?Outpatient Encounter Medications as of 05/14/2021  ?Medication Sig  ? albuterol (PROVENTIL) (2.5 MG/3ML) 0.083% nebulizer solution Take 3 mLs (2.5 mg total) by nebulization every 6 (six) hours as needed for wheezing or shortness of breath.  ? amLODipine (NORVASC) 5 MG tablet Take 5 mg by mouth daily.  ? Ascorbic Acid (VITAMIN C PO) Take 1-2 tablets by mouth 2 (two) times daily.  ? Cholecalciferol (VITAMIN D PO) Take 1 tablet by mouth daily.  ? Cyanocobalamin (VITAMIN B-12 PO) Take 1 tablet by mouth daily.  ? doxycycline (VIBRA-TABS) 100 MG tablet Take 1 tablet (100 mg total) by mouth 2 (two) times daily.  ? Multiple Vitamin  (MULTIVITAMIN) tablet Take 1 tablet by mouth daily.  ? predniSONE (STERAPRED UNI-PAK 21 TAB) 10 MG (21) TBPK tablet Take per package instructions. Do not skip doses. Finish entire supply.  ? VALERIAN ROOT PO Take 2 capsules by mouth at bedtime.  ? [  DISCONTINUED] albuterol (VENTOLIN HFA) 108 (90 Base) MCG/ACT inhaler Inhale 2 puffs into the lungs every 6 (six) hours as needed.  ? [DISCONTINUED] Spacer/Aero-Holding Chambers (AEROCHAMBER MV) inhaler by Other route. Use as instructed  ? albuterol (VENTOLIN HFA) 108 (90 Base) MCG/ACT inhaler Inhale 1-2 puffs into the lungs every 6 (six) hours as needed.  ? budesonide-formoterol (SYMBICORT) 160-4.5 MCG/ACT inhaler Inhale 2 puffs into the lungs 2 (two) times daily.  ? losartan (COZAAR) 50 MG tablet Take 1 tablet (50 mg total) by mouth daily.  ? Spacer/Aero-Holding Chambers (AEROCHAMBER MV) inhaler Use as directed with albuterol inhaler.  ? [DISCONTINUED] clonazePAM (KLONOPIN) 0.5 MG tablet Take 0.5 mg by mouth at bedtime as needed.  ? [DISCONTINUED] Melatonin 5 MG TABS Take 1 tablet by mouth at bedtime.  ? [DISCONTINUED] Saw Palmetto 450 MG CAPS Take 3 capsules by mouth 2 (two) times daily.  ? ?No facility-administered encounter medications on file as of 05/14/2021.  ? ? ?Follow-up: Return in about 6 months (around 11/14/2021) for AWV w LPN.  ? ?PLAN ?Refill albuterol and albuterol neb. Will give doxycycline and prednisone for concern for lower respiratory bacterial infection.  ?Suggest follow up with Dr. Percell Miller for knee pain. Encouraged continued home PT exercises ?Labs collected. Will follow up with the patient as warranted. ?Return for AWV with LPN ?Patient encouraged to call clinic with any questions, comments, or concerns. ? ?Maximiano Coss, NP ? ?

## 2021-05-15 LAB — HEPATITIS C ANTIBODY
Hepatitis C Ab: NONREACTIVE
SIGNAL TO CUT-OFF: <0.02 (ref <1.00)

## 2021-05-21 ENCOUNTER — Telehealth (HOSPITAL_COMMUNITY): Payer: Self-pay

## 2021-05-21 NOTE — Telephone Encounter (Signed)
Pt insurance is active and benefits verified through [facility] $20, DED 0/0 met, out of pocket $3,950/$29.75 met, co-insurance 0%. no pre-authorization required. Passport, Michelle/BCBS 05/21/2021_0 :10pm, REF# [id number] ?

## 2021-05-29 ENCOUNTER — Telehealth: Payer: Self-pay | Admitting: Registered Nurse

## 2021-05-29 ENCOUNTER — Other Ambulatory Visit: Payer: Self-pay | Admitting: Registered Nurse

## 2021-05-29 DIAGNOSIS — G4700 Insomnia, unspecified: Secondary | ICD-10-CM

## 2021-05-29 MED ORDER — ZALEPLON 10 MG PO CAPS: 10.0000 mg | ORAL_CAPSULE | Freq: Every evening | ORAL | 0 refills | Status: DC | PRN

## 2021-05-29 NOTE — Telephone Encounter (Signed)
Pt was informed expressed understanding  ?

## 2021-05-29 NOTE — Progress Notes (Signed)
Pt out of zaleplon early. Next fill due 06/11/21. Will queue up.  ?Have discussed risks, benefits, and alternatives of medication with patient ? ?Kathrin Ruddy, NP ?

## 2021-05-29 NOTE — Telephone Encounter (Signed)
We had discussed this at last visit. Will refill when due - 06/11/21 - he had 90 day supply filled on 03/13/21 ? ?Thanks, ? ?Rich

## 2021-05-29 NOTE — Telephone Encounter (Signed)
Pt called in stating that he is going to run out of the Zqaleplon 10 mg tablets while he is traveling. He wanted to know if these could be called into the [facility] on [address].  ? ?Pt just established care with Delfino Lovett last month  ?

## 2021-05-29 NOTE — Telephone Encounter (Signed)
Pt is asking for a medication not listed on medication list, he has newly established care, please advise ?

## 2021-06-25 ENCOUNTER — Telehealth (HOSPITAL_COMMUNITY): Payer: Self-pay | Admitting: *Deleted

## 2021-06-29 ENCOUNTER — Encounter (HOSPITAL_COMMUNITY)
Admission: RE | Admit: 2021-06-29 | Discharge: 2021-06-29 | Disposition: A | Discharge status: Home / Self Care | Payer: Medicare Other | Source: Ambulatory Visit | Attending: Cardiology | Admitting: Cardiology

## 2021-06-29 ENCOUNTER — Encounter (HOSPITAL_COMMUNITY): Payer: Self-pay

## 2021-06-29 VITALS — BP 116/58 | HR 68 | Ht 69.5 in | Wt 185.6 lb

## 2021-06-29 DIAGNOSIS — G4730 Sleep apnea, unspecified: Secondary | ICD-10-CM | POA: Diagnosis present

## 2021-06-29 NOTE — Progress Notes (Signed)
Jay Fox 71 y.o. male ?Pulmonary Rehab Orientation Note ?This patient who was referred to Pulmonary Rehab by Dr. Criss Rosales ([facility] coverage) with the diagnosis of Sleep Apnea arrived today in Cardiac and Pulmonary Rehab. He arrived with ambulatory normal gait. He does not carry portable oxygen. Per pt, Jay Fox uses oxygen never. Color good, skin warm and dry. Patient is oriented to time and place. Patient's medical history, psychosocial health, and medications reviewed. Psychosocial assessment reveals pt lives with spouse. Jay Fox is currently retired. Pt hobbies include biking and building model trains. Pt reports his stress level is moderate. Areas of stress/anxiety include health. Pt does not exhibit signs of depression. PHQ2/9 score 0/2. Jay Fox shows good  coping skills with positive outlook on life. Offered emotional support and reassurance. Will continue to monitor. Physical assessment performed by Jay Small, RN. Please see their orientation physical assessment note. Jay Fox reports he  does take medications as prescribed. Patient states he  follows a regular  diet. The patient reports no specific efforts to gain or lose weight.. Pt's weight will be monitored closely. Demonstration and practice of PLB using pulse oximeter. Jay Fox able to return demonstration satisfactorily. Safety and hand hygiene in the exercise area reviewed with patient. Jay Fox voices understanding of the information reviewed. Department expectations discussed with patient and achievable goals were set. The patient shows enthusiasm about attending the program and we look forward to working with Jay Fox. Jay Fox completed a 6 min walk test today and is scheduled to begin exercise on 07/07/21 at 10:15 am.  ? ?1030-1200 ?Sheppard Plumber, MS, ACSM-CEP ?  ?

## 2021-06-29 NOTE — Progress Notes (Signed)
Pulmonary Individual Treatment Plan ? ?Patient Details  ?Name: Jay Fox ?MRN: [id number] ?Date of Birth: 05/03/1950 ?Referring Provider:   ?Flowsheet Row Pulmonary Rehab Walk Test from 06/29/2021 in [facility]  ?Referring Provider Daneil Dan coverage]  ? ?  ? ? ?Initial Encounter Date:  ?Flowsheet Row Pulmonary Rehab Walk Test from 06/29/2021 in [facility]  ?Date 06/29/21  ? ?  ? ? ?Visit Diagnosis: Sleep apnea, unspecified type ? ?Patient's Home Medications on Admission:  ? ?Current Outpatient Medications:  ?  albuterol (PROVENTIL) (2.5 MG/3ML) 0.083% nebulizer solution, Take 3 mLs (2.5 mg total) by nebulization every 6 (six) hours as needed for wheezing or shortness of breath., Disp: 150 mL, Rfl: 1 ?  albuterol (VENTOLIN HFA) 108 (90 Base) MCG/ACT inhaler, Inhale 1-2 puffs into the lungs every 6 (six) hours as needed., Disp: 18 g, Rfl: 11 ?  amLODipine (NORVASC) 5 MG tablet, Take 10 mg by mouth daily., Disp: , Rfl:  ?  Ascorbic Acid (VITAMIN C PO), Take 1-2 tablets by mouth 2 (two) times daily., Disp: , Rfl:  ?  Cholecalciferol (VITAMIN D PO), Take 1 tablet by mouth daily., Disp: , Rfl:  ?  citalopram (CELEXA) 10 MG tablet, Take 10 mg by mouth daily., Disp: , Rfl:  ?  Cyanocobalamin (VITAMIN B-12 PO), Take 1 tablet by mouth daily., Disp: , Rfl:  ?  irbesartan (AVAPRO) 300 MG tablet, Take 300 mg by mouth daily., Disp: , Rfl:  ?  Multiple Vitamin (MULTIVITAMIN) tablet, Take 1 tablet by mouth daily., Disp: , Rfl:  ?  ropinirole (REQUIP) 5 MG tablet, Take 5 mg by mouth at bedtime., Disp: , Rfl:  ?  Spacer/Aero-Holding Chambers (AEROCHAMBER MV) inhaler, Use as directed with albuterol inhaler., Disp: 1 each, Rfl: 0 ?  VALERIAN ROOT PO, Take 2 capsules by mouth at bedtime., Disp: , Rfl:  ?  zaleplon (SONATA) 10 MG capsule, Take 1 capsule (10 mg total) by mouth at bedtime as needed for sleep., Disp: 90 capsule, Rfl: 0 ?  budesonide-formoterol  (SYMBICORT) 160-4.5 MCG/ACT inhaler, Inhale 2 puffs into the lungs 2 (two) times daily., Disp: 1 Inhaler, Rfl: 12 ?  doxycycline (VIBRA-TABS) 100 MG tablet, Take 1 tablet (100 mg total) by mouth 2 (two) times daily. (Patient not taking: Reported on 06/29/2021), Disp: 20 tablet, Rfl: 0 ?  losartan (COZAAR) 50 MG tablet, Take 1 tablet (50 mg total) by mouth daily., Disp: 30 tablet, Rfl: 6 ?  predniSONE (STERAPRED UNI-PAK 21 TAB) 10 MG (21) TBPK tablet, Take per package instructions. Do not skip doses. Finish entire supply. (Patient not taking: Reported on 06/29/2021), Disp: 1 each, Rfl: 0 ? ?Past Medical History: ?Past Medical History:  ?Diagnosis Date  ? Allergy 1954  ? Anxiety   ? Arthritis   ? Asthma   ? BPH (benign prostatic hypertrophy)   ? Cancer [facility])   ? Cataract   ? Chronic kidney disease   ? Depression   ? GERD (gastroesophageal reflux disease)   ? History of kidney cancer   ? Hypertension   ? Seasonal allergies   ? Sleep apnea   ? ? ?Tobacco Use: ?Social History  ? ?Tobacco Use  ?Smoking Status Never  ?Smokeless Tobacco Never  ? ? ?Labs: ?Review Flowsheet   ? ?  ?  Latest Ref Rng & Units 05/14/2021  ?Labs for ITP Cardiac and Pulmonary Rehab  ?Cholestrol 0 - 200 mg/dL 169    ?LDL (calc) 0 -  99 mg/dL 73    ?HDL-C >39.00 mg/dL 59.40    ?Trlycerides 0.0 - 149.0 mg/dL 182.0    ?Hemoglobin A1c 4.6 - 6.5 % 5.9    ?  ? ? Multiple values from one day are sorted in reverse-chronological order  ?  ?  ? ? ?Capillary Blood Glucose: ?No results found for: GLUCAP ? ? ?Pulmonary Assessment Scores: ? Pulmonary Assessment Scores   ? ? [place] Name 06/29/21 1129  ?  ?  ?  ? ADL UCSD  ? ADL Phase Entry    ? SOB Score total 74    ?  ? CAT Score  ? CAT Score 28    ?  ? mMRC Score  ? mMRC Score 1    ? ?  ?  ? ?  ? ?UCSD: ?Self-administered rating of dyspnea associated with activities of daily living (ADLs) ?6-point scale (0 = "not at all" to 5 = "maximal or unable to do because of breathlessness")  ?Scoring Scores range from 0 to 120.   Minimally important difference is 5 units ? ?CAT: ?CAT can identify the health impairment of COPD patients and is better correlated with disease progression.  ?CAT has a scoring range of zero to 40. The CAT score is classified into four groups of low (less than 10), medium (10 - 20), high (21-30) and very high (31-40) based on the impact level of disease on health status. A CAT score over 10 suggests significant symptoms.  A worsening CAT score could be explained by an exacerbation, poor medication adherence, poor inhaler technique, or progression of COPD or comorbid conditions.  ?CAT MCID is 2 points ? ?mMRC: ?mMRC (Modified Medical Research Council) Dyspnea Scale is used to assess the degree of baseline functional disability in patients of respiratory disease due to dyspnea. ?No minimal important difference is established. A decrease in score of 1 point or greater is considered a positive change.  ? ?Pulmonary Function Assessment: ? Pulmonary Function Assessment - 06/29/21 1139   ? ?  ? Breath  ? Bilateral Breath Sounds Clear   ? Shortness of Breath Yes;Limiting activity   ? ?  ?  ? ?  ? ? ?Exercise Target Goals: ?Exercise Program Goal: ?Individual exercise prescription set using results from initial 6 min walk test and THRR while considering  patient?s activity barriers and safety.  ? ?Exercise Prescription Goal: ?Initial exercise prescription builds to 30-45 minutes a day of aerobic activity, 2-3 days per week.  Home exercise guidelines will be given to patient during program as part of exercise prescription that the participant will acknowledge. ? ?Activity Barriers & Risk Stratification: ? Activity Barriers & Cardiac Risk Stratification - 06/29/21 1117   ? ?  ? Activity Barriers & Cardiac Risk Stratification  ? Activity Barriers Arthritis;Joint Problems;Deconditioning;Muscular Weakness;Shortness of Breath;History of Falls   ? Cardiac Risk Stratification Low   ? ?  ?  ? ?  ? ? ?6 Minute Walk: ? 6 Minute Walk    ? ? [place] Name 06/29/21 1243  ?  ?  ?  ? 6 Minute Walk  ? Phase Initial    ? Distance 1176 feet    ? Walk Time 6 minutes    ? # of Rest Breaks 0    ? MPH 2.23    ? METS 2.54    ? RPE 12    ? Perceived Dyspnea  0    ? VO2 Peak 8.89    ? Symptoms No    ?  Resting HR 68 bpm    ? Resting BP 116/58    ? Resting Oxygen Saturation  97 %    ? Exercise Oxygen Saturation  during 6 min walk 95 %    ? Max Ex. HR 80 bpm    ? Max Ex. BP 118/60    ? 2 Minute Post BP 118/60  4 min: 120/62    ?  ? Interval HR  ? 1 Minute HR 68    ? 2 Minute HR 76    ? 3 Minute HR 72    ? 4 Minute HR 76    ? 5 Minute HR 80    ? 6 Minute HR 79    ? 2 Minute Post HR 62    ? Interval Heart Rate? Yes    ?  ? Interval Oxygen  ? Interval Oxygen? Yes    ? Baseline Oxygen Saturation % 97 %    ? 1 Minute Oxygen Saturation % 97 %    ? 1 Minute Liters of Oxygen 0 L    ? 2 Minute Oxygen Saturation % 95 %    ? 2 Minute Liters of Oxygen 0 L    ? 3 Minute Oxygen Saturation % 97 %    ? 3 Minute Liters of Oxygen 0 L    ? 4 Minute Oxygen Saturation % 98 %    ? 4 Minute Liters of Oxygen 0 L    ? 5 Minute Oxygen Saturation % 96 %    ? 5 Minute Liters of Oxygen 0 L    ? 6 Minute Oxygen Saturation % 97 %    ? 6 Minute Liters of Oxygen 0 L    ? 2 Minute Post Oxygen Saturation % 97 %    ? 2 Minute Post Liters of Oxygen 0 L    ? ?  ?  ? ?  ? ? ?Oxygen Initial Assessment: ? Oxygen Initial Assessment - 06/29/21 1108   ? ?  ? Home Oxygen  ? Home Oxygen Device None   ? Sleep Oxygen Prescription CPAP   ? Home Exercise Oxygen Prescription None   ? Home Resting Oxygen Prescription None   ? Compliance with Home Oxygen Use Yes   ?  ? Initial 6 min Walk  ? Oxygen Used None   ?  ? Program Oxygen Prescription  ? Program Oxygen Prescription None   ?  ? Intervention  ? Short Term Goals To learn and exhibit compliance with exercise, home and travel O2 prescription;To learn and understand importance of maintaining oxygen saturations>88%;To learn and understand importance of monitoring SPO2  with pulse oximeter and demonstrate accurate use of the pulse oximeter.;To learn and demonstrate proper pursed lip breathing techniques or other breathing techniques. ;To learn and demonstrate proper use of respira

## 2021-06-29 NOTE — Progress Notes (Signed)
Pulmonary Rehab Orientation Physical Assessment Note ? ?Physical assessment reveals  Pt is alert and oriented x 3.  Heart rate is normal, breath sounds clear to auscultation, no wheezes, rales, or rhonchi. Reports non-productive cough along with wheezing.  Pt used his rescue inhaler prior to arriving for his orientation appt.. Bowel sounds present.  Pt denies abdominal discomfort, nausea, vomiting or diarrhea. Grip strength equal, strong. Distal pulses palpable with no swelling to lower extremities however he does report swelling to the right knee.  Pt has "bone on bone" sees Dr. Percell Miller.  Last injection was last year.  Still has some difficulty with his knee which limits his mobility.  Hopeful that Pulmonary rehab will help. Cherre Huger, BSN ?Cardiac and Pulmonary Rehab Nurse Navigator  ? ? ?

## 2021-07-07 ENCOUNTER — Encounter (HOSPITAL_COMMUNITY)
Admission: RE | Admit: 2021-07-07 | Discharge: 2021-07-07 | Disposition: A | Discharge status: Home / Self Care | Payer: Medicare Other | Source: Ambulatory Visit | Attending: Cardiology | Admitting: Cardiology

## 2021-07-07 DIAGNOSIS — G4730 Sleep apnea, unspecified: Secondary | ICD-10-CM

## 2021-07-07 NOTE — Progress Notes (Signed)
Daily Session Note ? ?Patient Details  ?Name: Jay Fox ?MRN: [id number] ?Date of Birth: 28-Dec-1950 ?Referring Provider:   ?Flowsheet Row Pulmonary Rehab Walk Test from 06/29/2021 in [facility]  ?Referring Provider Daneil Dan coverage]  ? ?  ? ? ?Encounter Date: 07/07/2021 ? ?Check In: ? Session Check In - 07/07/21 1205   ? ?  ? Check-In  ? Supervising physician immediately available to respond to emergencies [facility] immediately available   ? Physician(s) [place]   ? Location [facility]   ? Staff Present Maurice Small, RN, Quentin Ore, MS, ACSM-CEP, Exercise Physiologist;Joan Leonia Reeves, RN, Roque Cash, RN   ? Virtual Visit No   ? Medication changes reported     No   ? Fall or balance concerns reported    No   ? Tobacco Cessation No Change   ? Warm-up and Cool-down Performed as group-led instruction   ? Resistance Training Performed Yes   ? VAD Patient? No   ? PAD/SET Patient? No   ?  ? Pain Assessment  ? Currently in Pain? No/denies   ? Pain Score 0-No pain   ? Multiple Pain Sites No   ? ?  ?  ? ?  ? ? ?Capillary Blood Glucose: ?No results found for this or any previous visit (from the past 24 hour(s)). ? ? ? ?Social History  ? ?Tobacco Use  ?Smoking Status Never  ?Smokeless Tobacco Never  ? ? ?Goals Met:  ?Proper associated with RPD/PD & O2 Sat ?Exercise tolerated well ?No report of concerns or symptoms today ?Strength training completed today ? ?Goals Unmet:  ?Not Applicable ? ?Comments: Service time is from 1026 to 1142.  ? ? ?Dr. Rodman Pickle is Medical Director for Pulmonary Rehab at [facility].  ?

## 2021-07-08 NOTE — Progress Notes (Signed)
Pulmonary Individual Treatment Plan ? ?Patient Details  ?Name: Jay Fox ?MRN: [id number] ?Date of Birth: 08/05/1950 ?Referring Provider:   ?Flowsheet Row Pulmonary Rehab Walk Test from 06/29/2021 in [facility]  ?Referring Provider Daneil Dan coverage]  ? ?  ? ? ?Initial Encounter Date:  ?Flowsheet Row Pulmonary Rehab Walk Test from 06/29/2021 in [facility]  ?Date 06/29/21  ? ?  ? ? ?Visit Diagnosis: Sleep apnea, unspecified type ? ?Patient's Home Medications on Admission:  ? ?Current Outpatient Medications:  ?  albuterol (PROVENTIL) (2.5 MG/3ML) 0.083% nebulizer solution, Take 3 mLs (2.5 mg total) by nebulization every 6 (six) hours as needed for wheezing or shortness of breath., Disp: 150 mL, Rfl: 1 ?  albuterol (VENTOLIN HFA) 108 (90 Base) MCG/ACT inhaler, Inhale 1-2 puffs into the lungs every 6 (six) hours as needed., Disp: 18 g, Rfl: 11 ?  amLODipine (NORVASC) 5 MG tablet, Take 10 mg by mouth daily., Disp: , Rfl:  ?  Ascorbic Acid (VITAMIN C PO), Take 1-2 tablets by mouth 2 (two) times daily., Disp: , Rfl:  ?  budesonide-formoterol (SYMBICORT) 160-4.5 MCG/ACT inhaler, Inhale 2 puffs into the lungs 2 (two) times daily., Disp: 1 Inhaler, Rfl: 12 ?  Cholecalciferol (VITAMIN D PO), Take 1 tablet by mouth daily., Disp: , Rfl:  ?  citalopram (CELEXA) 10 MG tablet, Take 10 mg by mouth daily., Disp: , Rfl:  ?  Cyanocobalamin (VITAMIN B-12 PO), Take 1 tablet by mouth daily., Disp: , Rfl:  ?  doxycycline (VIBRA-TABS) 100 MG tablet, Take 1 tablet (100 mg total) by mouth 2 (two) times daily. (Patient not taking: Reported on 06/29/2021), Disp: 20 tablet, Rfl: 0 ?  irbesartan (AVAPRO) 300 MG tablet, Take 300 mg by mouth daily., Disp: , Rfl:  ?  losartan (COZAAR) 50 MG tablet, Take 1 tablet (50 mg total) by mouth daily., Disp: 30 tablet, Rfl: 6 ?  Multiple Vitamin (MULTIVITAMIN) tablet, Take 1 tablet by mouth daily., Disp: , Rfl:  ?  predniSONE (STERAPRED  UNI-PAK 21 TAB) 10 MG (21) TBPK tablet, Take per package instructions. Do not skip doses. Finish entire supply. (Patient not taking: Reported on 06/29/2021), Disp: 1 each, Rfl: 0 ?  ropinirole (REQUIP) 5 MG tablet, Take 5 mg by mouth at bedtime., Disp: , Rfl:  ?  Spacer/Aero-Holding Chambers (AEROCHAMBER MV) inhaler, Use as directed with albuterol inhaler., Disp: 1 each, Rfl: 0 ?  VALERIAN ROOT PO, Take 2 capsules by mouth at bedtime., Disp: , Rfl:  ?  zaleplon (SONATA) 10 MG capsule, Take 1 capsule (10 mg total) by mouth at bedtime as needed for sleep., Disp: 90 capsule, Rfl: 0 ? ?Past Medical History: ?Past Medical History:  ?Diagnosis Date  ? Allergy 1954  ? Anxiety   ? Arthritis   ? Asthma   ? BPH (benign prostatic hypertrophy)   ? Cancer [facility])   ? Cataract   ? Chronic kidney disease   ? Depression   ? GERD (gastroesophageal reflux disease)   ? History of kidney cancer   ? Hypertension   ? Seasonal allergies   ? Sleep apnea   ? ? ?Tobacco Use: ?Social History  ? ?Tobacco Use  ?Smoking Status Never  ?Smokeless Tobacco Never  ? ? ?Labs: ?Review Flowsheet   ? ?  ?  Latest Ref Rng & Units 05/14/2021  ?Labs for ITP Cardiac and Pulmonary Rehab  ?Cholestrol 0 - 200 mg/dL 169    ?LDL (calc) 0 -  99 mg/dL 73    ?HDL-C >39.00 mg/dL 59.40    ?Trlycerides 0.0 - 149.0 mg/dL 182.0    ?Hemoglobin A1c 4.6 - 6.5 % 5.9    ?  ? ? Multiple values from one day are sorted in reverse-chronological order  ?  ?  ? ? ?Capillary Blood Glucose: ?No results found for: GLUCAP ? ? ?Pulmonary Assessment Scores: ? Pulmonary Assessment Scores   ? ? [place] Name 06/29/21 1129  ?  ?  ?  ? ADL UCSD  ? ADL Phase Entry    ? SOB Score total 74    ?  ? CAT Score  ? CAT Score 28    ?  ? mMRC Score  ? mMRC Score 1    ? ?  ?  ? ?  ? ?UCSD: ?Self-administered rating of dyspnea associated with activities of daily living (ADLs) ?6-point scale (0 = "not at all" to 5 = "maximal or unable to do because of breathlessness")  ?Scoring Scores range from 0 to 120.   Minimally important difference is 5 units ? ?CAT: ?CAT can identify the health impairment of COPD patients and is better correlated with disease progression.  ?CAT has a scoring range of zero to 40. The CAT score is classified into four groups of low (less than 10), medium (10 - 20), high (21-30) and very high (31-40) based on the impact level of disease on health status. A CAT score over 10 suggests significant symptoms.  A worsening CAT score could be explained by an exacerbation, poor medication adherence, poor inhaler technique, or progression of COPD or comorbid conditions.  ?CAT MCID is 2 points ? ?mMRC: ?mMRC (Modified Medical Research Council) Dyspnea Scale is used to assess the degree of baseline functional disability in patients of respiratory disease due to dyspnea. ?No minimal important difference is established. A decrease in score of 1 point or greater is considered a positive change.  ? ?Pulmonary Function Assessment: ? Pulmonary Function Assessment - 06/29/21 1139   ? ?  ? Breath  ? Bilateral Breath Sounds Clear   ? Shortness of Breath Yes;Limiting activity   ? ?  ?  ? ?  ? ? ?Exercise Target Goals: ?Exercise Program Goal: ?Individual exercise prescription set using results from initial 6 min walk test and THRR while considering  patient?s activity barriers and safety.  ? ?Exercise Prescription Goal: ?Initial exercise prescription builds to 30-45 minutes a day of aerobic activity, 2-3 days per week.  Home exercise guidelines will be given to patient during program as part of exercise prescription that the participant will acknowledge. ? ?Activity Barriers & Risk Stratification: ? Activity Barriers & Cardiac Risk Stratification - 06/29/21 1117   ? ?  ? Activity Barriers & Cardiac Risk Stratification  ? Activity Barriers Arthritis;Joint Problems;Deconditioning;Muscular Weakness;Shortness of Breath;History of Falls   ? Cardiac Risk Stratification Low   ? ?  ?  ? ?  ? ? ?6 Minute Walk: ? 6 Minute Walk    ? ? [place] Name 06/29/21 1243  ?  ?  ?  ? 6 Minute Walk  ? Phase Initial    ? Distance 1176 feet    ? Walk Time 6 minutes    ? # of Rest Breaks 0    ? MPH 2.23    ? METS 2.54    ? RPE 12    ? Perceived Dyspnea  0    ? VO2 Peak 8.89    ? Symptoms No    ?  Resting HR 68 bpm    ? Resting BP 116/58    ? Resting Oxygen Saturation  97 %    ? Exercise Oxygen Saturation  during 6 min walk 95 %    ? Max Ex. HR 80 bpm    ? Max Ex. BP 118/60    ? 2 Minute Post BP 118/60  4 min: 120/62    ?  ? Interval HR  ? 1 Minute HR 68    ? 2 Minute HR 76    ? 3 Minute HR 72    ? 4 Minute HR 76    ? 5 Minute HR 80    ? 6 Minute HR 79    ? 2 Minute Post HR 62    ? Interval Heart Rate? Yes    ?  ? Interval Oxygen  ? Interval Oxygen? Yes    ? Baseline Oxygen Saturation % 97 %    ? 1 Minute Oxygen Saturation % 97 %    ? 1 Minute Liters of Oxygen 0 L    ? 2 Minute Oxygen Saturation % 95 %    ? 2 Minute Liters of Oxygen 0 L    ? 3 Minute Oxygen Saturation % 97 %    ? 3 Minute Liters of Oxygen 0 L    ? 4 Minute Oxygen Saturation % 98 %    ? 4 Minute Liters of Oxygen 0 L    ? 5 Minute Oxygen Saturation % 96 %    ? 5 Minute Liters of Oxygen 0 L    ? 6 Minute Oxygen Saturation % 97 %    ? 6 Minute Liters of Oxygen 0 L    ? 2 Minute Post Oxygen Saturation % 97 %    ? 2 Minute Post Liters of Oxygen 0 L    ? ?  ?  ? ?  ? ? ?Oxygen Initial Assessment: ? Oxygen Initial Assessment - 06/29/21 1108   ? ?  ? Home Oxygen  ? Home Oxygen Device None   ? Sleep Oxygen Prescription CPAP   ? Home Exercise Oxygen Prescription None   ? Home Resting Oxygen Prescription None   ? Compliance with Home Oxygen Use Yes   ?  ? Initial 6 min Walk  ? Oxygen Used None   ?  ? Program Oxygen Prescription  ? Program Oxygen Prescription None   ?  ? Intervention  ? Short Term Goals To learn and exhibit compliance with exercise, home and travel O2 prescription;To learn and understand importance of maintaining oxygen saturations>88%;To learn and understand importance of monitoring SPO2  with pulse oximeter and demonstrate accurate use of the pulse oximeter.;To learn and demonstrate proper pursed lip breathing techniques or other breathing techniques. ;To learn and demonstrate proper use of respira

## 2021-07-09 ENCOUNTER — Encounter (HOSPITAL_COMMUNITY)
Admission: RE | Admit: 2021-07-09 | Discharge: 2021-07-09 | Disposition: A | Discharge status: Home / Self Care | Payer: Medicare Other | Source: Ambulatory Visit | Attending: Cardiology | Admitting: Cardiology

## 2021-07-09 ENCOUNTER — Other Ambulatory Visit: Payer: Self-pay

## 2021-07-09 DIAGNOSIS — G4730 Sleep apnea, unspecified: Secondary | ICD-10-CM | POA: Diagnosis not present

## 2021-07-09 NOTE — Telephone Encounter (Signed)
MEDICATION:irbesartan (AVAPRO) 300 MG tablet // amLODipine (NORVASC) 5 MG tablet // ropinirole (REQUIP) 5 MG tablet // citalopram (CELEXA) 10 MG tablet ? ?PHARMACY: [facility] [address] [place] - [address] ? ?Comments: Patient states [facility] has faxed [place] several times with no response. Also wanted to know if Delfino Lovett wanted him to continue Celexa  ? ?**Let patient know to contact pharmacy at the end of the day to make sure medication is ready. ** ? ?** Please notify patient to allow 48-72 hours to process** ? ?**Encourage patient to contact the pharmacy for refills or they can request refills through [facility]** ?  ?

## 2021-07-09 NOTE — Progress Notes (Signed)
Daily Session Note ? ?Patient Details  ?Name: Jay Fox ?MRN: [id number] ?Date of Birth: [birth year] ?Referring Provider:   ?Flowsheet Row Pulmonary Rehab Walk Test from 06/29/2021 in [place]  ?Referring Provider Daneil Dan coverage]  ? ?  ? ? ?Encounter Date: 07/09/2021 ? ?Check In: ? Session Check In - 07/09/21 1138   ? ?  ? Check-In  ? Supervising physician immediately available to respond to emergencies [facility] immediately available   ? Physician(s) Dr. Bonner Puna   ? Location [facility]   ? Staff Present Elmon Else, MS, ACSM-CEP, Exercise Physiologist;Jetta Gilford Rile BS, ACSM EP-C, Exercise Physiologist;Lisa Ysidro Evert, RN   ? Virtual Visit No   ? Medication changes reported     No   ? Fall or balance concerns reported    No   ? Tobacco Cessation No Change   ? Warm-up and Cool-down Performed as group-led instruction   ? Resistance Training Performed Yes   ? VAD Patient? No   ? PAD/SET Patient? No   ?  ? Pain Assessment  ? Currently in Pain? No/denies   ? Multiple Pain Sites No   ? ?  ?  ? ?  ? ? ?Capillary Blood Glucose: ?No results found for this or any previous visit (from the past 24 hour(s)). ? ? ? ?Social History  ? ?Tobacco Use  ?Smoking Status Never  ?Smokeless Tobacco Never  ? ? ?Goals Met:  ?Proper associated with RPD/PD & O2 Sat ?Exercise tolerated well ?No report of concerns or symptoms today ?Strength training completed today ? ?Goals Unmet:  ?Not Applicable ? ?Comments: Service time is from 1019 to 1144.  ? ? ?Dr. Rodman Pickle is Medical Director for Pulmonary Rehab at [facility].  ?

## 2021-07-10 MED ORDER — CITALOPRAM HYDROBROMIDE 10 MG PO TABS: 10.0000 mg | ORAL_TABLET | Freq: Every day | ORAL | 1 refills | Status: DC

## 2021-07-10 MED ORDER — ROPINIROLE HCL 5 MG PO TABS: 5.0000 mg | ORAL_TABLET | Freq: Every day | ORAL | 1 refills | Status: DC

## 2021-07-10 MED ORDER — IRBESARTAN 300 MG PO TABS: 300.0000 mg | ORAL_TABLET | Freq: Every day | ORAL | 1 refills | Status: DC

## 2021-07-10 MED ORDER — AMLODIPINE BESYLATE 5 MG PO TABS: 10.0000 mg | ORAL_TABLET | Freq: Every day | ORAL | 1 refills | Status: DC

## 2021-07-10 NOTE — Telephone Encounter (Signed)
Patient is requesting a refill of the following medications: ?Requested Prescriptions  ? ?Pending Prescriptions Disp Refills  ? amLODipine (NORVASC) 5 MG tablet 90 tablet 1  ?  Sig: Take 2 tablets (10 mg total) by mouth daily.  ? irbesartan (AVAPRO) 300 MG tablet 90 tablet 1  ?  Sig: Take 1 tablet (300 mg total) by mouth daily.  ? ropinirole (REQUIP) 5 MG tablet 90 tablet 1  ?  Sig: Take 1 tablet (5 mg total) by mouth at bedtime.  ? citalopram (CELEXA) 10 MG tablet 90 tablet 1  ?  Sig: Take 1 tablet (10 mg total) by mouth daily.  ? ? ?Date of patient request: 07/10/21 ?Last office visit: 05/14/21 ?Date of last refill: unknown ?Last refill amount: unknown  ? Pt established with you and is asking for refills  ? ?

## 2021-07-14 ENCOUNTER — Encounter (HOSPITAL_COMMUNITY)
Admission: RE | Admit: 2021-07-14 | Discharge: 2021-07-14 | Disposition: A | Discharge status: Home / Self Care | Payer: Medicare Other | Source: Ambulatory Visit | Attending: Cardiology | Admitting: Cardiology

## 2021-07-14 VITALS — Wt 183.9 lb

## 2021-07-14 DIAGNOSIS — G4730 Sleep apnea, unspecified: Secondary | ICD-10-CM | POA: Insufficient documentation

## 2021-07-14 NOTE — Progress Notes (Signed)
Daily Session Note ? ?Patient Details  ?Name: Jay Fox ?MRN: [id number] ?Date of Birth: 06-19-1950 ?Referring Provider:   ?Flowsheet Row Pulmonary Rehab Walk Test from 06/29/2021 in [place]  ?Referring Provider Daneil Dan coverage]  ? ?  ? ? ?Encounter Date: 07/14/2021 ? ?Check In: ? Session Check In - 07/14/21 1151   ? ?  ? Check-In  ? Supervising physician immediately available to respond to emergencies [facility] immediately available   ? Physician(s) Dr. Bonner Puna   ? Location [facility]   ? Staff Present Elmon Else, MS, ACSM-CEP, Exercise Physiologist;Joan Leonia Reeves, RN, BSN;Carlette Wilber Oliphant, RN, Deland Pretty, MS, ACSM CEP, Exercise Physiologist;Annedrea Rosezella [place], RN, MHA   ? Virtual Visit No   ? Medication changes reported     No   ? Fall or balance concerns reported    No   ? Tobacco Cessation No Change   ? Warm-up and Cool-down Performed as group-led instruction   ? Resistance Training Performed Yes   ? VAD Patient? No   ? PAD/SET Patient? No   ?  ? Pain Assessment  ? Currently in Pain? No/denies   ? Pain Score 0-No pain   ? Multiple Pain Sites No   ? ?  ?  ? ?  ? ? ?Capillary Blood Glucose: ?No results found for this or any previous visit (from the past 24 hour(s)). ? ? Exercise Prescription Changes - 07/14/21 1200   ? ?  ? Response to Exercise  ? Blood Pressure (Admit) 108/60   ? Blood Pressure (Exercise) 128/62   ? Blood Pressure (Exit) 130/62   ? Heart Rate (Admit) 64 bpm   ? Heart Rate (Exercise) 74 bpm   ? Heart Rate (Exit) 70 bpm   ? Oxygen Saturation (Admit) 96 %   ? Oxygen Saturation (Exercise) 96 %   ? Oxygen Saturation (Exit) 97 %   ? Rating of Perceived Exertion (Exercise) 12   ? Perceived Dyspnea (Exercise) 3   ? Duration Continue with 30 min of aerobic exercise without signs/symptoms of physical distress.   ? Intensity THRR unchanged   ?  ? Progression  ? Progression Continue to progress workloads to maintain intensity  without signs/symptoms of physical distress.   ?  ? Resistance Training  ? Training Prescription Yes   ? Weight blue bands   ? Reps 10-15   ? Time 10 Minutes   ?  ? Bike  ? Level 3   ? Minutes 15   ? METs 4   ?  ? NuStep  ? Level 5   ? SPM 80   ? Minutes 15   ? METs 2.7   ? ?  ?  ? ?  ? ? ?Social History  ? ?Tobacco Use  ?Smoking Status Never  ?Smokeless Tobacco Never  ? ? ?Goals Met:  ?Proper associated with RPD/PD & O2 Sat ?Exercise tolerated well ?No report of concerns or symptoms today ?Strength training completed today ? ?Goals Unmet:  ?Not Applicable ? ?Comments: Service time is from 1014 to 1140.  ? ? ?Dr. Rodman Pickle is Medical Director for Pulmonary Rehab at [facility].  ?

## 2021-07-16 ENCOUNTER — Encounter (HOSPITAL_COMMUNITY)
Admission: RE | Admit: 2021-07-16 | Discharge: 2021-07-16 | Disposition: A | Discharge status: Home / Self Care | Payer: Medicare Other | Source: Ambulatory Visit | Attending: Cardiology | Admitting: Cardiology

## 2021-07-16 DIAGNOSIS — G4730 Sleep apnea, unspecified: Secondary | ICD-10-CM | POA: Diagnosis not present

## 2021-07-16 NOTE — Progress Notes (Signed)
Daily Session Note ? ?Patient Details  ?Name: Jay Fox ?MRN: [id number] ?Date of Birth: [birth year] ?Referring Provider:   ?Flowsheet Row Pulmonary Rehab Walk Test from 06/29/2021 in [place]  ?Referring Provider Daneil Dan coverage]  ? ?  ? ? ?Encounter Date: 07/16/2021 ? ?Check In: ? Session Check In - 07/16/21 1132   ? ?  ? Check-In  ? Supervising physician immediately available to respond to emergencies [facility] immediately available   ? Physician(s) Dr. Karleen Hampshire   ? Location [facility]   ? Staff Present Rosebud Poles, RN, BSN;Carlette Wilber Oliphant, RN, Quentin Ore, MS, ACSM-CEP, Exercise Physiologist;David Makemson, MS, ACSM-CEP, CCRP, Exercise Physiologist   ? Virtual Visit No   ? Medication changes reported     No   ? Fall or balance concerns reported    No   ? Tobacco Cessation No Change   ? Warm-up and Cool-down Performed as group-led instruction   ? Resistance Training Performed Yes   ? VAD Patient? No   ? PAD/SET Patient? No   ?  ? Pain Assessment  ? Currently in Pain? No/denies   ? ?  ?  ? ?  ? ? ?Capillary Blood Glucose: ?No results found for this or any previous visit (from the past 24 hour(s)). ? ? ? ?Social History  ? ?Tobacco Use  ?Smoking Status Never  ?Smokeless Tobacco Never  ? ? ?Goals Met:  ?Proper associated with RPD/PD & O2 Sat ?Exercise tolerated well ?No report of concerns or symptoms today ?Strength training completed today ? ?Goals Unmet:  ?Not Applicable ? ?Comments: Service time is from 1017 to 1138.  ? ? ?Dr. Rodman Pickle is Medical Director for Pulmonary Rehab at [facility].  ?

## 2021-07-21 ENCOUNTER — Encounter (HOSPITAL_COMMUNITY)
Admission: RE | Admit: 2021-07-21 | Discharge: 2021-07-21 | Disposition: A | Discharge status: Home / Self Care | Payer: Medicare Other | Source: Ambulatory Visit | Attending: Cardiology | Admitting: Cardiology

## 2021-07-21 DIAGNOSIS — G4730 Sleep apnea, unspecified: Secondary | ICD-10-CM

## 2021-07-21 NOTE — Progress Notes (Signed)
Daily Session Note ? ?Patient Details  ?Name: Nehemias Sauceda ?MRN: [id number] ?Date of Birth: August 17, 1950 ?Referring Provider:   ?Flowsheet Row Pulmonary Rehab Walk Test from 06/29/2021 in [place]  ?Referring Provider Daneil Dan coverage]  ? ?  ? ? ?Encounter Date: 07/21/2021 ? ?Check In: ? Session Check In - 07/21/21 1200   ? ?  ? Check-In  ? Supervising physician immediately available to respond to emergencies [facility] immediately available   ? Physician(s) Dr. Karleen Hampshire   ? Location [facility]   ? Staff Present Rosebud Poles, RN, BSN;Carlette Wilber Oliphant, RN, Quentin Ore, MS, ACSM-CEP, Exercise Physiologist;David Makemson, MS, ACSM-CEP, CCRP, Exercise Physiologist   ? Virtual Visit No   ? Medication changes reported     No   ? Fall or balance concerns reported    No   ? Tobacco Cessation No Change   ? Warm-up and Cool-down Performed as group-led instruction   ? Resistance Training Performed Yes   ? VAD Patient? No   ? PAD/SET Patient? No   ?  ? Pain Assessment  ? Currently in Pain? No/denies   ? Pain Score 0-No pain   ? Multiple Pain Sites No   ? ?  ?  ? ?  ? ? ?Capillary Blood Glucose: ?No results found for this or any previous visit (from the past 24 hour(s)). ? ? ? ?Social History  ? ?Tobacco Use  ?Smoking Status Never  ?Smokeless Tobacco Never  ? ? ?Goals Met:  ?Proper associated with RPD/PD & O2 Sat ?Independence with exercise equipment ?Exercise tolerated well ?No report of concerns or symptoms today ?Strength training completed today ? ?Goals Unmet:  ?Not Applicable ? ?Comments: Service time is from 1015 to 1128. ? ? ? ?Dr. Rodman Pickle is Medical Director for Pulmonary Rehab at [facility].  ?

## 2021-07-23 ENCOUNTER — Encounter (HOSPITAL_COMMUNITY)
Admission: RE | Admit: 2021-07-23 | Discharge: 2021-07-23 | Disposition: A | Discharge status: Home / Self Care | Payer: Medicare Other | Source: Ambulatory Visit | Attending: Cardiology | Admitting: Cardiology

## 2021-07-23 DIAGNOSIS — G4730 Sleep apnea, unspecified: Secondary | ICD-10-CM

## 2021-07-23 NOTE — Progress Notes (Signed)
[name] 71 y.o. male ?Nutrition Note ?Clair Gulling is motivated to make lifestyle changes to aid with pulmonary rehab. Patient has medical history of HTN, asthma, BPH, seasonal allergies. He report history of prostate and renal cancer. He lives at home with his wife; she does the majority of the grocery shopping and cooking. He reports that she is diabetic and has made many dietary changes for their household. He reports being very active and enjoys being outside prior to cancer treatment in 2020 and difficulty breathing. He does report recent changes in taste and loose stool/diarrhea multiple times per week.  ? ?Labs: triglycerides 182, A1c 5.9 ? ?24 hour recall:  ?Breakfast: raisin bran with soy milk, banana ?Lunch: [place] sandwich on whole wheat bread OR salad ?Dinner: shredded pork, cole slaw OR pasta with vegetables ?Snack: occasional light beer or liquor, ice cream <1x/week ? ?Nutrition Diagnosis ?Excessive carbohydrate intake related to consumption of convenience foods and sugary beverages as evidenced by A1C 5.9 and fasting CBG 103 mg/dl and triglycerides 182 ? ?Nutrition Intervention ?Pt?s individual nutrition plan reviewed with pt. ?Benefits of adopting Heart Healthy diet discussed.  ?Continue client-centered nutrition education by RD, as part of interdisciplinary care. ? ?Monitor/Evaluation: ?Patient reports motivation to make lifestyle changes for adherence to heart healthy diet recommendation, blood sugar control. We discussed the plate method as a guide for meal planning, high fiber food choices, lean protein sources. Encouraged follow-up with PCP or GI regarding new onset diarrhea/loose stool with regular occurrence. Patient amicable to RD suggestions and verbalizes understanding. Will follow-up as needed.  ? ?15 minutes spent in review of topics related to a heart healthy diet including sodium intake, blood sugar control, weight management, and fiber intake. ? ?Goal(s) ?Pt to identify and limit food  sources of saturated fat, trans fat, refined carbohydrates and sodium ?Pt able to name foods that affect blood glucose. Continue to limit simple sugars, refined carbohydrates, sugary beverages, etc.  ?Pt to describe the benefit of including lean protein/plant proteins, fruits, vegetables, whole grains, nuts/seeds, and low-fat dairy products in a heart healthy meal plan. ? ? ?Plan:  ?Pt to attend nutrition classes  ?Will provide client-centered nutrition education as part of interdisciplinary care ?Monitor and evaluate progress toward nutrition goal with team. ? ? ?Whitney Bingaman [place], MS, RDN, LDN  ?

## 2021-07-23 NOTE — Progress Notes (Signed)
Daily Session Note ? ?Patient Details  ?Name: Jay Fox ?MRN: [id number] ?Date of Birth: 07-26-1950 ?Referring Provider:   ?Flowsheet Row Pulmonary Rehab Walk Test from 06/29/2021 in [place]  ?Referring Provider Daneil Dan coverage]  ? ?  ? ? ?Encounter Date: 07/23/2021 ? ?Check In: ? Session Check In - 07/23/21 1132   ? ?  ? Check-In  ? Supervising physician immediately available to respond to emergencies [facility] immediately available   ? Physician(s) Dr. Sloan Leiter   ? Location [facility]   ? Staff Present Maurice Small, RN, Quentin Ore, MS, ACSM-CEP, Exercise Physiologist;Annedrea Rosezella [place], RN, [place]   ? Virtual Visit No   ? Medication changes reported     No   ? Fall or balance concerns reported    No   ? Tobacco Cessation No Change   ? Warm-up and Cool-down Performed as group-led instruction   ? Resistance Training Performed Yes   ? VAD Patient? No   ? PAD/SET Patient? No   ?  ? Pain Assessment  ? Currently in Pain? No/denies   ? ?  ?  ? ?  ? ? ?Capillary Blood Glucose: ?No results found for this or any previous visit (from the past 24 hour(s)). ? ? ? ?Social History  ? ?Tobacco Use  ?Smoking Status Never  ?Smokeless Tobacco Never  ? ? ?Goals Met:  ?Proper associated with RPD/PD & O2 Sat ?Exercise tolerated well ?No report of concerns or symptoms today ?Strength training completed today ? ?Goals Unmet:  ?Not Applicable ? ?Comments: Service time is from 1015 to 1137.  ? ? ?Dr. Rodman Pickle is Medical Director for Pulmonary Rehab at [facility].  ?

## 2021-07-28 ENCOUNTER — Encounter (HOSPITAL_COMMUNITY): Payer: Medicare Other

## 2021-07-30 ENCOUNTER — Encounter (HOSPITAL_COMMUNITY): Payer: Medicare Other

## 2021-08-03 DIAGNOSIS — H2513 Age-related nuclear cataract, bilateral: Secondary | ICD-10-CM | POA: Diagnosis not present

## 2021-08-03 DIAGNOSIS — H33002 Unspecified retinal detachment with retinal break, left eye: Secondary | ICD-10-CM | POA: Diagnosis not present

## 2021-08-03 DIAGNOSIS — H43813 Vitreous degeneration, bilateral: Secondary | ICD-10-CM | POA: Diagnosis not present

## 2021-08-04 ENCOUNTER — Telehealth (HOSPITAL_COMMUNITY): Payer: Self-pay | Admitting: *Deleted

## 2021-08-04 ENCOUNTER — Encounter (HOSPITAL_COMMUNITY): Payer: Medicare Other

## 2021-08-04 ENCOUNTER — Telehealth (HOSPITAL_COMMUNITY): Payer: Self-pay | Admitting: Registered Nurse

## 2021-08-04 NOTE — Telephone Encounter (Signed)
Called Clair Gulling to discuss why he was dropping from the PR program. He states that they had denied his claims for PR and something about a 100.00 co pay. Our scheduler was able to check on his account and found that he has some hospital bills but, none of those are related to the PR program. He has not had any bills filed due to his PR visits and his co pay is 20.00 per visit. I discussed this with him and he voices understanding.He states that he does feel that he has gotten information from the program and feels that he is ready to exercise on his own. I encouraged him to let us know if we can be of any help to him in the future.

## 2021-08-04 NOTE — Progress Notes (Signed)
Discharge Progress Report  Patient Details  Name: Jay Fox MRN: [id number] Date of Birth: 02/06/1951 Referring Provider:   April Manson [facility] Walk Test from 06/29/2021 in [place]  Referring Provider Daneil Dan coverage]        Number of Visits: 6  Reason for Discharge:  Early Exit:  Pt decided that he felt as though he could exercise on his own. We discussed this and I let him know that if he needs [place] for anything in the future to contact us.  Smoking History:  Social History   Tobacco Use  Smoking Status Never  Smokeless Tobacco Never    Diagnosis:  Sleep apnea, unspecified type  ADL UCSD:  Pulmonary Assessment Scores     Row Name 06/29/21 1129         ADL UCSD   ADL Phase Entry     SOB Score total 74       CAT Score   CAT Score 28       mMRC Score   mMRC Score 1              Initial Exercise Prescription:  Initial Exercise Prescription - 06/29/21 1200       Date of Initial Exercise RX and Referring Provider   Date 06/29/21    Referring Provider Daneil Dan coverage   Expected Discharge Date 09/03/21      Bike   Level 1    Watts 30    Minutes 15      NuStep   Level 2    SPM 70    Minutes 15      Prescription Details   Frequency (times per week) 2    Duration Progress to 30 minutes of continuous aerobic without signs/symptoms of physical distress      Intensity   THRR 40-80% of Max Heartrate 60-121    Ratings of Perceived Exertion 11-13    Perceived Dyspnea 0-4      Progression   Progression Continue to progress workloads to maintain intensity without signs/symptoms of physical distress.      Resistance Training   Training Prescription Yes    Weight blue bands    Reps 10-15             Discharge Exercise Prescription (Final Exercise Prescription Changes):  Exercise Prescription Changes - 07/28/21 1100       Response to Exercise   Blood Pressure (Admit) 110/58     Blood Pressure (Exit) 122/60    Heart Rate (Admit) 69 bpm    Heart Rate (Exercise) 82 bpm    Heart Rate (Exit) 64 bpm    Oxygen Saturation (Admit) 97 %    Oxygen Saturation (Exercise) 96 %    Oxygen Saturation (Exit) 98 %    Rating of Perceived Exertion (Exercise) 10    Perceived Dyspnea (Exercise) 1    Duration Continue with 30 min of aerobic exercise without signs/symptoms of physical distress.    Intensity THRR unchanged      Progression   Progression Continue to progress workloads to maintain intensity without signs/symptoms of physical distress.      Resistance Training   Training Prescription Yes    Weight blue bands    Reps 10-15    Time 10 Minutes      Bike   Level 3.5    Minutes 15    METs 4.8      NuStep   Level 6  SPM 90    Minutes 15    METs 2.8             Functional Capacity:  6 Minute Walk     Row Name 06/29/21 1243         6 Minute Walk   Phase Initial     Distance 1176 feet     Walk Time 6 minutes     # of Rest Breaks 0     MPH 2.23     METS 2.54     RPE 12     Perceived Dyspnea  0     VO2 Peak 8.89     Symptoms No     Resting HR 68 bpm     Resting BP 116/58     Resting Oxygen Saturation  97 %     Exercise Oxygen Saturation  during 6 min walk 95 %     Max Ex. HR 80 bpm     Max Ex. BP 118/60     2 Minute Post BP 118/60  4 min: 120/62       Interval HR   1 Minute HR 68     2 Minute HR 76     3 Minute HR 72     4 Minute HR 76     5 Minute HR 80     6 Minute HR 79     2 Minute Post HR 62     Interval Heart Rate? Yes       Interval Oxygen   Interval Oxygen? Yes     Baseline Oxygen Saturation % 97 %     1 Minute Oxygen Saturation % 97 %     1 Minute Liters of Oxygen 0 L     2 Minute Oxygen Saturation % 95 %     2 Minute Liters of Oxygen 0 L     3 Minute Oxygen Saturation % 97 %     3 Minute Liters of Oxygen 0 L     4 Minute Oxygen Saturation % 98 %     4 Minute Liters of Oxygen 0 L     5 Minute Oxygen Saturation % 96 %      5 Minute Liters of Oxygen 0 L     6 Minute Oxygen Saturation % 97 %     6 Minute Liters of Oxygen 0 L     2 Minute Post Oxygen Saturation % 97 %     2 Minute Post Liters of Oxygen 0 L              Psychological, QOL, Others - Outcomes: PHQ 2/9:    06/29/2021   11:09 AM 05/14/2021    9:52 AM  Depression screen PHQ 2/9  Decreased Interest 0 0  Down, Depressed, Hopeless 0 0  PHQ - 2 Score 0 0  Altered sleeping 1 0  Tired, decreased energy 1 0  Change in appetite 0 0  Feeling bad or failure about yourself  0 0  Trouble concentrating 0 0  Moving slowly or fidgety/restless 0 0  Suicidal thoughts 0 0  PHQ-9 Score 2 0  Difficult doing work/chores Very difficult Not difficult at all    Quality of Life:   Personal Goals: Goals established at orientation with interventions provided to work toward goal.  Personal Goals and Risk Factors at Admission - 06/29/21 1116       Core Components/Risk Factors/Patient Goals on Admission    Weight  Management Yes    Improve shortness of breath with ADL's Yes    Intervention Provide education, individualized exercise plan and daily activity instruction to help decrease symptoms of SOB with activities of daily living.    Expected Outcomes Short Term: Improve cardiorespiratory fitness to achieve a reduction of symptoms when performing ADLs;Long Term: Be able to perform more ADLs without symptoms or delay the onset of symptoms              Personal Goals Discharge:  Goals and Risk Factor Review     Row Name 07/07/21 1629             Core Components/Risk Factors/Patient Goals Review   Personal Goals Review Weight Management/Obesity;Improve shortness of breath with ADL's;Develop more efficient breathing techniques such as purse lipped breathing and diaphragmatic breathing and practicing self-pacing with activity.;Increase knowledge of respiratory medications and ability to use respiratory devices properly.       Review He only  attended one session will continue to monitor.       Expected Outcomes See admission goals.                Exercise Goals and Review:  Exercise Goals     Row Name 07/02/21 0757 07/27/21 0938           Exercise Goals   Increase Physical Activity Yes Yes      Intervention Provide advice, education, support and counseling about physical activity/exercise needs.;Develop an individualized exercise prescription for aerobic and resistive training based on initial evaluation findings, risk stratification, comorbidities and participant's personal goals. Provide advice, education, support and counseling about physical activity/exercise needs.;Develop an individualized exercise prescription for aerobic and resistive training based on initial evaluation findings, risk stratification, comorbidities and participant's personal goals.      Expected Outcomes Short Term: Attend rehab on a regular basis to increase amount of physical activity.;Long Term: Add in home exercise to make exercise part of routine and to increase amount of physical activity.;Long Term: Exercising regularly at least 3-5 days a week. Short Term: Attend rehab on a regular basis to increase amount of physical activity.;Long Term: Add in home exercise to make exercise part of routine and to increase amount of physical activity.;Long Term: Exercising regularly at least 3-5 days a week.      Increase Strength and Stamina Yes Yes      Intervention Provide advice, education, support and counseling about physical activity/exercise needs.;Develop an individualized exercise prescription for aerobic and resistive training based on initial evaluation findings, risk stratification, comorbidities and participant's personal goals. Provide advice, education, support and counseling about physical activity/exercise needs.;Develop an individualized exercise prescription for aerobic and resistive training based on initial evaluation findings, risk  stratification, comorbidities and participant's personal goals.      Expected Outcomes Short Term: Increase workloads from initial exercise prescription for resistance, speed, and METs.;Short Term: Perform resistance training exercises routinely during rehab and add in resistance training at home;Long Term: Improve cardiorespiratory fitness, muscular endurance and strength as measured by increased METs and functional capacity (6MWT) Short Term: Increase workloads from initial exercise prescription for resistance, speed, and METs.;Short Term: Perform resistance training exercises routinely during rehab and add in resistance training at home;Long Term: Improve cardiorespiratory fitness, muscular endurance and strength as measured by increased METs and functional capacity (6MWT)      Able to understand and use rate of perceived exertion (RPE) scale Yes Yes      Intervention Provide education and explanation on how  to use RPE scale Provide education and explanation on how to use RPE scale      Expected Outcomes Short Term: Able to use RPE daily in rehab to express subjective intensity level;Long Term:  Able to use RPE to guide intensity level when exercising independently Short Term: Able to use RPE daily in rehab to express subjective intensity level;Long Term:  Able to use RPE to guide intensity level when exercising independently      Able to understand and use Dyspnea scale Yes Yes      Intervention Provide education and explanation on how to use Dyspnea scale Provide education and explanation on how to use Dyspnea scale      Expected Outcomes Short Term: Able to use Dyspnea scale daily in rehab to express subjective sense of shortness of breath during exertion;Long Term: Able to use Dyspnea scale to guide intensity level when exercising independently Short Term: Able to use Dyspnea scale daily in rehab to express subjective sense of shortness of breath during exertion;Long Term: Able to use Dyspnea scale to  guide intensity level when exercising independently      Knowledge and understanding of Target Heart Rate Range (THRR) Yes Yes      Intervention Provide education and explanation of THRR including how the numbers were predicted and where they are located for reference Provide education and explanation of THRR including how the numbers were predicted and where they are located for reference      Expected Outcomes Short Term: Able to state/look up THRR;Long Term: Able to use THRR to govern intensity when exercising independently;Short Term: Able to use daily as guideline for intensity in rehab Short Term: Able to state/look up THRR;Long Term: Able to use THRR to govern intensity when exercising independently;Short Term: Able to use daily as guideline for intensity in rehab      Understanding of Exercise Prescription Yes Yes      Intervention Provide education, explanation, and written materials on patient's individual exercise prescription Provide education, explanation, and written materials on patient's individual exercise prescription      Expected Outcomes Short Term: Able to explain program exercise prescription;Long Term: Able to explain home exercise prescription to exercise independently Short Term: Able to explain program exercise prescription;Long Term: Able to explain home exercise prescription to exercise independently               Exercise Goals Re-Evaluation:  Exercise Goals Re-Evaluation     Row Name 07/02/21 0757 07/27/21 0938           Exercise Goal Re-Evaluation   Exercise Goals Review Increase Physical Activity;Increase Strength and Stamina;Able to understand and use rate of perceived exertion (RPE) scale;Able to understand and use Dyspnea scale;Knowledge and understanding of Target Heart Rate Range (THRR);Understanding of Exercise Prescription Increase Physical Activity;Increase Strength and Stamina;Able to understand and use rate of perceived exertion (RPE) scale;Able to  understand and use Dyspnea scale;Knowledge and understanding of Target Heart Rate Range (THRR);Understanding of Exercise Prescription      Comments Clair Gulling is scheduled to begin exercise next week. Will continue to monitor and progress as able. Clair Gulling has completed 6 exercise sessions. He has missed a few sessions due to vacation and illness. Jim exercises for 15 min on the [employer] bike and Nustep. He averages 4.8 METs at level 3.5 on the bike and 2.8 METs at level 6 on the Nustep. Clair Gulling has increased his workload for both exercise modes and has tolerated progressions well. He is motivated to exercise  and increase his functional capacity. Clair Gulling performs the warmup and cooldown standing without limitations. Will continue to monitor and progress as able.      Expected Outcomes Through exercise at rehab and home, the patient will decrease shortness of breath with daily activities and feel confident in carrying out an exercise regimen at home. Through exercise at rehab and home, the patient will decrease shortness of breath with daily activities and feel confident in carrying out an exercise regimen at home.               Nutrition & Weight - Outcomes:   Post Biometrics - 06/29/21 1043        Post  Biometrics   Grip Strength 28 kg             Nutrition:  Nutrition Therapy & Goals - 07/23/21 1618       Nutrition Therapy   Diet Heart Healthy Diet      Personal Nutrition Goals   Nutrition Goal Patient to describe the benefit of including fruits, vegetables, whole grains, low fat dairy, lean protein/plant protein as part of a heart healthy diet    Comments Reviewed diet intake, labs, and diet recommendations/changes. Encouraged follow-up with PCP regarding new ongoing diarrhea/loose stools.      Intervention Plan   Intervention Prescribe, educate and counsel regarding individualized specific dietary modifications aiming towards targeted core components such as weight, hypertension, lipid management,  diabetes, heart failure and other comorbidities.    Expected Outcomes Short Term Goal: Understand basic principles of dietary content, such as calories, fat, sodium, cholesterol and nutrients.;Long Term Goal: Adherence to prescribed nutrition plan.             Nutrition Discharge:  Nutrition Assessments - 07/09/21 1253       Rate Your Plate Scores   Pre Score 73             Education Questionnaire Score:  Knowledge Questionnaire Score - 06/29/21 1126       Knowledge Questionnaire Score   Pre Score 16/18             Goals reviewed with patient; copy given to patient.

## 2021-08-06 ENCOUNTER — Encounter (HOSPITAL_COMMUNITY): Payer: Medicare Other

## 2021-08-11 ENCOUNTER — Encounter (HOSPITAL_COMMUNITY): Payer: Medicare Other

## 2021-08-13 ENCOUNTER — Encounter (HOSPITAL_COMMUNITY): Payer: Medicare Other

## 2021-08-18 ENCOUNTER — Encounter (HOSPITAL_COMMUNITY): Payer: Medicare Other

## 2021-08-19 DIAGNOSIS — M1711 Unilateral primary osteoarthritis, right knee: Secondary | ICD-10-CM | POA: Diagnosis not present

## 2021-08-20 ENCOUNTER — Encounter (HOSPITAL_COMMUNITY): Payer: Medicare Other

## 2021-08-25 ENCOUNTER — Encounter (HOSPITAL_COMMUNITY): Payer: Medicare Other

## 2021-08-26 DIAGNOSIS — M1711 Unilateral primary osteoarthritis, right knee: Secondary | ICD-10-CM | POA: Diagnosis not present

## 2021-08-27 ENCOUNTER — Encounter (HOSPITAL_COMMUNITY): Payer: Medicare Other

## 2021-09-01 ENCOUNTER — Encounter (HOSPITAL_COMMUNITY): Payer: Medicare Other

## 2021-09-03 ENCOUNTER — Encounter (HOSPITAL_COMMUNITY): Payer: Medicare Other

## 2021-09-05 ENCOUNTER — Other Ambulatory Visit: Payer: Self-pay | Admitting: Registered Nurse

## 2021-09-05 DIAGNOSIS — G4700 Insomnia, unspecified: Secondary | ICD-10-CM

## 2021-09-07 DIAGNOSIS — M1711 Unilateral primary osteoarthritis, right knee: Secondary | ICD-10-CM | POA: Diagnosis not present

## 2021-10-13 ENCOUNTER — Other Ambulatory Visit: Payer: Self-pay | Admitting: Registered Nurse

## 2021-10-16 ENCOUNTER — Other Ambulatory Visit: Payer: Self-pay

## 2021-10-16 DIAGNOSIS — J45909 Unspecified asthma, uncomplicated: Secondary | ICD-10-CM

## 2021-10-16 DIAGNOSIS — J4540 Moderate persistent asthma, uncomplicated: Secondary | ICD-10-CM

## 2021-10-16 MED ORDER — BUDESONIDE-FORMOTEROL FUMARATE 160-4.5 MCG/ACT IN AERO: 2.0000 | INHALATION_SPRAY | Freq: Two times a day (BID) | RESPIRATORY_TRACT | 12 refills | Status: DC

## 2021-11-03 ENCOUNTER — Telehealth: Payer: Self-pay | Admitting: Registered Nurse

## 2021-11-03 NOTE — Telephone Encounter (Signed)
Caller name: Clair Gulling   On DPR? :yes/no: Yes  Call back number:  [phone]  Provider they see: Maximiano Coss  Reason for call: Pt states that still having problems getting getting C-PAP supplies. Please send into [facility] [phone].

## 2021-11-11 DIAGNOSIS — C61 Malignant neoplasm of prostate: Secondary | ICD-10-CM | POA: Diagnosis not present

## 2021-11-13 DIAGNOSIS — C642 Malignant neoplasm of left kidney, except renal pelvis: Secondary | ICD-10-CM | POA: Diagnosis not present

## 2021-11-13 DIAGNOSIS — C649 Malignant neoplasm of unspecified kidney, except renal pelvis: Secondary | ICD-10-CM | POA: Diagnosis not present

## 2021-11-13 DIAGNOSIS — N281 Cyst of kidney, acquired: Secondary | ICD-10-CM | POA: Diagnosis not present

## 2021-11-13 DIAGNOSIS — C61 Malignant neoplasm of prostate: Secondary | ICD-10-CM | POA: Diagnosis not present

## 2021-11-18 DIAGNOSIS — C61 Malignant neoplasm of prostate: Secondary | ICD-10-CM | POA: Diagnosis not present

## 2021-11-18 DIAGNOSIS — N393 Stress incontinence (female) (male): Secondary | ICD-10-CM | POA: Diagnosis not present

## 2021-11-18 DIAGNOSIS — C642 Malignant neoplasm of left kidney, except renal pelvis: Secondary | ICD-10-CM | POA: Diagnosis not present

## 2021-11-23 ENCOUNTER — Encounter: Payer: Medicare Other | Admitting: Internal Medicine

## 2021-11-30 ENCOUNTER — Ambulatory Visit (INDEPENDENT_AMBULATORY_CARE_PROVIDER_SITE_OTHER): Payer: Medicare Other | Admitting: Internal Medicine

## 2021-11-30 ENCOUNTER — Encounter: Payer: Self-pay | Admitting: Internal Medicine

## 2021-11-30 VITALS — BP 120/62 | HR 66 | Temp 98.0°F | Resp 14 | Ht 69.5 in | Wt 179.0 lb

## 2021-11-30 DIAGNOSIS — Z9079 Acquired absence of other genital organ(s): Secondary | ICD-10-CM

## 2021-11-30 DIAGNOSIS — Z8546 Personal history of malignant neoplasm of prostate: Secondary | ICD-10-CM | POA: Diagnosis not present

## 2021-11-30 DIAGNOSIS — N39498 Other specified urinary incontinence: Secondary | ICD-10-CM

## 2021-11-30 DIAGNOSIS — Z85528 Personal history of other malignant neoplasm of kidney: Secondary | ICD-10-CM | POA: Diagnosis not present

## 2021-11-30 DIAGNOSIS — G2581 Restless legs syndrome: Secondary | ICD-10-CM

## 2021-11-30 DIAGNOSIS — R7303 Prediabetes: Secondary | ICD-10-CM

## 2021-11-30 DIAGNOSIS — M7989 Other specified soft tissue disorders: Secondary | ICD-10-CM

## 2021-11-30 DIAGNOSIS — M545 Low back pain, unspecified: Secondary | ICD-10-CM | POA: Diagnosis not present

## 2021-11-30 DIAGNOSIS — G4733 Obstructive sleep apnea (adult) (pediatric): Secondary | ICD-10-CM

## 2021-11-30 DIAGNOSIS — G8929 Other chronic pain: Secondary | ICD-10-CM

## 2021-11-30 HISTORY — DX: Other specified soft tissue disorders: M79.89

## 2021-11-30 HISTORY — DX: Restless legs syndrome: G25.81

## 2021-11-30 HISTORY — DX: Personal history of malignant neoplasm of prostate: Z85.46

## 2021-11-30 HISTORY — DX: Obstructive sleep apnea (adult) (pediatric): G47.33

## 2021-11-30 MED ORDER — PRAMIPEXOLE DIHYDROCHLORIDE 0.5 MG PO TABS: 0.5000 mg | ORAL_TABLET | Freq: Three times a day (TID) | ORAL | 3 refills | Status: DC

## 2021-11-30 MED ORDER — ROTIGOTINE 3 MG/24HR TD PT24: 1.0000 | MEDICATED_PATCH | Freq: Every day | TRANSDERMAL | 5 refills | Status: DC

## 2021-11-30 MED ORDER — RESPIRATORY THERAPY SUPPLIES MISC: 0 refills | Status: AC

## 2021-11-30 NOTE — Progress Notes (Addendum)
Today's healthcare provider: Loralee Pacas, MD  Phone: [phone]  New patient visit  Visit Date: 11/30/2021 Patient: Jay Fox   DOB: [birth year]   71 y.o. Male  MRN: [id number]  Assessment and Plan:   Jay Fox was seen today for transfer of care, 6 month follow-up, restless legs and skin issue.  History of prostate cancer  History of radical prostatectomy Overview: Surgery Jan 2020 And radiation to prostate bed oct-dec 2020   History of renal cell carcinoma Overview: Cryo ablated April-may 2021 Ct surveillance neg Nov 12, 2021   Other urinary incontinence Overview: Ever since prostatectomy and had lupron for like 18 mo   Orders: -     Methylmalonic acid, serum  Leg swelling Overview: Noticed early 2023 with ferritin deposition.   Orders: -     Ambulatory referral to Vascular Surgery -     Methylmalonic acid, serum  Restless legs Overview: Worsening, requiring increased requip now at 10 mg daily and still not fully controlled.  Comprehensive Review of the Case: The patient is a 71 year old male with a history of restless legs syndrome (RLS) for 10-12 years, which has been progressively worsening despite increasing doses of ropinirole, now at 10 mg daily. He also has a history of insomnia for which he takes valerian root, sonata, and ambien. He has a past medical history of prostate cancer, renal cancer, and hypertension. His medication list includes albuterol (both nebulizer solution and inhaler), amitriptyline, amlodipine, ascorbic acid, budesonide-formoterol, cholecalciferol, citalopram, cyanocobalamin, diclofenac sodium, doxycycline, fluticasone, irbesartan, losartan, and a multivitamin. He reports that when his restless legs flare up and his feet won't stop moving, it sometimes makes his heart race.   Most Likely Dx:  Augmentation due to long-term use of ropinirole for Restless Legs Syndrome (RLS). Augmentation is a common side effect of long-term  dopaminergic treatment for RLS, characterized by an earlier onset of symptoms, increased intensity, and spread to other body parts. The presence of these symptoms despite increasing doses of ropinirole, along with the patient's long history of RLS and ropinirole use, could suggest this diagnosis.  Expanded DDx:  Iron Deficiency Anemia: Iron deficiency anemia could explain the patient's worsening RLS, as low iron levels are known to exacerbate RLS symptoms. The presence of symptoms such as fatigue, pallor, or shortness of breath, along with low serum ferritin or hemoglobin levels, could further suggest this diagnosis.  Peripheral Neuropathy: Peripheral neuropathy could explain the patient's restless legs and worsening symptoms, as it can cause uncomfortable sensations in the legs that are sometimes mistaken for RLS. The presence of other symptoms such as numbness, tingling, or pain in the feet or hands, along with abnormal nerve conduction studies or electromyography, could further suggest this diagnosis.  Alternative DDx:  Renal Failure: Renal failure could explain the patient's worsening RLS, as it is known to exacerbate RLS symptoms. The presence of symptoms such as fatigue, decreased urine output, or swelling in the legs, ankles, or feet, along with abnormal kidney function tests, could further suggest this diagnosis.  Diabetes Mellitus: Diabetes could explain the patient's restless legs and worsening symptoms, as peripheral neuropathy is a common complication of diabetes and can cause symptoms similar to RLS. The presence of symptoms such as increased thirst or urination, unexplained weight loss, or fatigue, along with elevated blood glucose levels, could further suggest this diagnosis.  Parkinson's Disease: Parkinson's disease could explain the patient's restless legs and worsening symptoms, as RLS is more common in people with Parkinson's disease. The presence of symptoms such  as tremors, slow  movement, or rigid muscles, along with abnormal findings on a neurological examination, could further suggest this diagnosis.  Spinal Cord Lesions: Spinal cord lesions could explain the patient's restless legs and worsening symptoms, as they can cause uncomfortable sensations in the legs that are sometimes mistaken for RLS. The presence of other neurological symptoms, along with abnormal findings on a spinal MRI, could further suggest this diagnosis.  Medication-Induced RLS: The patient's RLS could be exacerbated by certain medications he is taking, such as amitriptyline or citalopram. The presence of a temporal relationship between the initiation of these medications and the worsening of RLS symptoms could further suggest this diagnosis.  Sleep Apnea: Sleep apnea could explain the patient's restless legs and worsening symptoms, as it is known to exacerbate RLS. The presence of symptoms such as loud snoring, episodes of stopped breathing during sleep, or excessive daytime sleepiness, along with abnormal findings on a sleep study, could further suggest this diagnosis.  Assessment & Plan: Progressive Worsening of Restless Legs Syndrome Despite Increasing Doses of Ropinirole  The patient is a 71 year old male with a long-standing history of restless legs syndrome (RLS) that has been progressively worsening despite increasing doses of ropinirole. This could be indicative of augmentation, a common side effect of long-term dopaminergic treatment for RLS, characterized by an earlier onset of symptoms, increased intensity, and spread to other body parts. The patient's history of insomnia, prostate cancer, renal cancer, and hypertension, along with his extensive medication list, could also be contributing to his symptoms. The patient's report of his heart racing when his restless legs flare up is concerning and warrants further investigation. The differential diagnosis includes augmentation due to long-term use of  ropinirole, iron deficiency anemia, peripheral neuropathy, renal failure, diabetes mellitus, Parkinson's disease, spinal cord lesions, medication-induced RLS, and sleep apnea.  Dx:  Serum ferritin and hemoglobin levels to rule out iron deficiency anemia Nerve conduction studies and electromyography to evaluate for peripheral neuropathy (will hold off on for now) Kidney function tests to assess for renal failure Blood glucose levels and hemoglobin A1c to rule out diabetes mellitus Neurological examination to evaluate for Parkinson's disease and other sleep disorders Spinal MRI to assess for spinal cord lesions Review of medication list and timing of symptom onset to evaluate for medication-induced RLS (will stop citalopram and encouraged stopping valerian root) Sleep study to assess for sleep apnea Tx:  Will assume augmentation due to long-term use of ropinirole is occurring, and try switching to a different medication for RLS If iron deficiency anemia is confirmed, consider iron supplementation If peripheral neuropathy is confirmed, consider medications such as gabapentin or pregabalin for symptom management-but he already has gabapentin If renal failure is confirmed, consider dialysis or kidney transplant If diabetes mellitus is confirmed, consider insulin therapy or oral hypoglycemic agents If Parkinson's disease is confirmed, consider levodopa-carbidopa therapy If spinal cord lesions are confirmed, consider surgical intervention or radiation therapy If medication-induced RLS is confirmed, consider discontinuing the offending medication (citalopram or valerian are suspected- he is no longer taking amitriptyline) If sleep apnea is confirmed, consider continuous positive airway pressure (CPAP) therapy or lifestyle modifications such as weight loss and smoking cessation.  Orders: -     Ambulatory referral to Neurology -     Ferritin -     Methylmalonic acid, serum -     Pramipexole  Dihydrochloride; Take 1 tablet (0.5 mg total) by mouth 3 (three) times daily.  Dispense: 270 tablet; Refill: 3 -  Rotigotine; Place 1 patch (3 mg total) onto the skin daily.  Dispense: 30 patch; Refill: 5 - MRI L spine with and without for this plus back pain plus cancer history  Prediabetes -     Methylmalonic acid, serum -     CBC -     Comprehensive metabolic panel -     Hemoglobin A1c -     Lipid panel  OSA (obstructive sleep apnea) Assessment & Plan: Sent bipap supplies Sent to neuro  Orders: -     Respiratory Therapy Supplies; Filters Hoses tubing masks headgear cleaning supplies for [employer] and  [employer] bipap machine.  Dispense: 2 each; Refill: 0       Health Maintenance  Topic Date Due   Zoster Vaccines- Shingrix (1 of 2) 07/11/1970   COLONOSCOPY (Pts 45-59yr Insurance coverage will need to be confirmed)  Never done   Pneumonia Vaccine 70 Years old (2 - PCV) 07/05/2018   COVID-19 Vaccine (3 - Moderna risk series) 02/16/2021   INFLUENZA VACCINE  12/27/2021 (Originally 10/13/2021)   TETANUS/TDAP  05/13/2027   Hepatitis C Screening  Completed   HPV VACCINES  Aged Out     Recommended follow up: Return in about 6 months (around 05/31/2022).  Subjective:  Patient presents today to establish care.  Prior patient of Dr. MOrland Mustard  Chief Complaint  Patient presents with   Transfer of Care   6 month follow-up   Restless legs    For 10 years or more. Getting worse.   Skin issue    Ankles have areas that are discolored. For 4-6 months.    For history taking, I took a per problem history from the patient and chart review as follows: Problem  History of Prostate Cancer  History of Radical Prostatectomy   Surgery Jan 2020 And radiation to prostate bed oct-dec 2020   Leg Swelling   Noticed early 2023 with ferritin deposition.    Restless Legs   Worsening, requiring increased requip now at 10 mg daily and still not fully controlled.  Comprehensive  Review of the Case: The patient is a 71year old male with a history of restless legs syndrome (RLS) for 10-12 years, which has been progressively worsening despite increasing doses of ropinirole, now at 10 mg daily. He also has a history of insomnia for which he takes valerian root, sonata, and ambien. He has a past medical history of prostate cancer, renal cancer, and hypertension. His medication list includes albuterol (both nebulizer solution and inhaler), amitriptyline, amlodipine, ascorbic acid, budesonide-formoterol, cholecalciferol, citalopram, cyanocobalamin, diclofenac sodium, doxycycline, fluticasone, irbesartan, losartan, and a multivitamin. He reports that when his restless legs flare up and his feet won't stop moving, it sometimes makes his heart race. No laboratory or imaging data, physical examination findings, or details about the course of his illness are provided in the summary.  Most Likely Dx:  Augmentation due to long-term use of ropinirole for Restless Legs Syndrome (RLS). Augmentation is a common side effect of long-term dopaminergic treatment for RLS, characterized by an earlier onset of symptoms, increased intensity, and spread to other body parts. The presence of these symptoms despite increasing doses of ropinirole, along with the patient's long history of RLS and ropinirole use, could suggest this diagnosis.  Expanded DDx:  Iron Deficiency Anemia: Iron deficiency anemia could explain the patient's worsening RLS, as low iron levels are known to exacerbate RLS symptoms. The presence of symptoms such as fatigue, pallor, or shortness of breath, along with low serum ferritin  or hemoglobin levels, could further suggest this diagnosis.  Peripheral Neuropathy: Peripheral neuropathy could explain the patient's restless legs and worsening symptoms, as it can cause uncomfortable sensations in the legs that are sometimes mistaken for RLS. The presence of other symptoms such as numbness,  tingling, or pain in the feet or hands, along with abnormal nerve conduction studies or electromyography, could further suggest this diagnosis.  Alternative DDx:  Renal Failure: Renal failure could explain the patient's worsening RLS, as it is known to exacerbate RLS symptoms. The presence of symptoms such as fatigue, decreased urine output, or swelling in the legs, ankles, or feet, along with abnormal kidney function tests, could further suggest this diagnosis.  Diabetes Mellitus: Diabetes could explain the patient's restless legs and worsening symptoms, as peripheral neuropathy is a common complication of diabetes and can cause symptoms similar to RLS. The presence of symptoms such as increased thirst or urination, unexplained weight loss, or fatigue, along with elevated blood glucose levels, could further suggest this diagnosis.  Parkinson's Disease: Parkinson's disease could explain the patient's restless legs and worsening symptoms, as RLS is more common in people with Parkinson's disease. The presence of symptoms such as tremors, slow movement, or rigid muscles, along with abnormal findings on a neurological examination, could further suggest this diagnosis.  Spinal Cord Lesions: Spinal cord lesions could explain the patient's restless legs and worsening symptoms, as they can cause uncomfortable sensations in the legs that are sometimes mistaken for RLS. The presence of other neurological symptoms, along with abnormal findings on a spinal MRI, could further suggest this diagnosis.  Medication-Induced RLS: The patient's RLS could be exacerbated by certain medications he is taking, such as amitriptyline or citalopram. The presence of a temporal relationship between the initiation of these medications and the worsening of RLS symptoms could further suggest this diagnosis.  Sleep Apnea: Sleep apnea could explain the patient's restless legs and worsening symptoms, as it is known to exacerbate RLS. The  presence of symptoms such as loud snoring, episodes of stopped breathing during sleep, or excessive daytime sleepiness, along with abnormal findings on a sleep study, could further suggest this diagnosis.   Prediabetes  Osa (Obstructive Sleep Apnea)  History of Renal Cell Carcinoma   Cryo ablated April-may 2021 Ct surveillance neg Nov 12, 2021   Urinary Incontinence   Ever since prostatectomy and had lupron for like 18 mo    Bph (Benign Prostatic Hypertrophy) (Resolved)     Depression Screen    11/30/2021    3:07 PM 06/29/2021   11:09 AM 05/14/2021    9:52 AM  PHQ 2/9 Scores  PHQ - 2 Score 0 0 0  PHQ- 9 Score  2 0   No results found for any visits on 11/30/21.   The following were reviewed and entered/updated in epic: Past Medical History:  Diagnosis Date   Allergy 1953   Anxiety    Arthritis    Asthma    BPH (benign prostatic hypertrophy)    BPH (benign prostatic hypertrophy)    Cancer (HCC)    Cataract    Chronic kidney disease    Depression    GERD (gastroesophageal reflux disease)    History of kidney cancer    History of prostate cancer 11/30/2021   Hypertension    Leg swelling 11/30/2021   Noticed early 2023 with ferritin deposition.    OSA (obstructive sleep apnea) 11/30/2021   Restless legs 11/30/2021   Seasonal allergies    Sleep apnea  Past Surgical History:  Procedure Laterality Date   NASAL SINUS SURGERY  2019   RENAL CRYOABLATION     TONSILLECTOMY     Past Surgical History:  Procedure Laterality Date   NASAL SINUS SURGERY  2019   RENAL CRYOABLATION     TONSILLECTOMY     Family Status  Relation Name Status   Mother Mother Deceased   Father Father Deceased   PGM  (Not Specified)   PGF  (Not Specified)   Family History  Problem Relation Age of Onset   Emphysema Mother    Liver cancer Mother    Pancreatic cancer Mother    Anxiety disorder Mother    Cancer Mother    Hypertension Mother    Multiple myeloma Father    Cancer Father     Hypertension Father    Colon cancer Paternal Grandmother    Colon cancer Paternal Grandfather    Outpatient Medications Prior to Visit  Medication Sig Dispense Refill   albuterol (PROVENTIL) (2.5 MG/3ML) 0.083% nebulizer solution Take 3 mLs (2.5 mg total) by nebulization every 6 (six) hours as needed for wheezing or shortness of breath. 150 mL 1   albuterol (VENTOLIN HFA) 108 (90 Base) MCG/ACT inhaler Inhale 1-2 puffs into the lungs every 6 (six) hours as needed. 18 g 11   amLODipine (NORVASC) 5 MG tablet Take 2 tablets by mouth once daily 90 tablet 0   Ascorbic Acid (VITAMIN C PO) Take 1-2 tablets by mouth 2 (two) times daily.     budesonide-formoterol (SYMBICORT) 160-4.5 MCG/ACT inhaler Inhale 2 puffs into the lungs 2 (two) times daily. 1 each 12   Cholecalciferol (VITAMIN D PO) Take 1 tablet by mouth daily.     citalopram (CELEXA) 10 MG tablet Take 1 tablet (10 mg total) by mouth daily. 90 tablet 1   Cyanocobalamin (VITAMIN B-12 PO) Take 1 tablet by mouth daily.     diclofenac Sodium (VOLTAREN) 1 % GEL Place onto the skin.     doxycycline (VIBRA-TABS) 100 MG tablet Take 1 tablet (100 mg total) by mouth 2 (two) times daily. 20 tablet 0   fluticasone (FLONASE) 50 MCG/ACT nasal spray Place into the nose.     irbesartan (AVAPRO) 300 MG tablet Take 1 tablet (300 mg total) by mouth daily. 90 tablet 1   Multiple Vitamin (MULTIVITAMIN) tablet Take 1 tablet by mouth daily.     ropinirole (REQUIP) 5 MG tablet Take 1 tablet (5 mg total) by mouth at bedtime. 90 tablet 1   sildenafil (REVATIO) 20 MG tablet SMARTSIG:1-5 Tablet(s) By Mouth Daily PRN     Spacer/Aero-Holding Chambers (AEROCHAMBER MV) inhaler Use as directed with albuterol inhaler. 1 each 0   VALERIAN ROOT PO Take 2 capsules by mouth at bedtime.     zolpidem (AMBIEN) 10 MG tablet Take by mouth.     amitriptyline (ELAVIL) 150 MG tablet Take by mouth.     losartan (COZAAR) 50 MG tablet Take 1 tablet (50 mg total) by mouth daily. 30 tablet 6    predniSONE (STERAPRED UNI-PAK 21 TAB) 10 MG (21) TBPK tablet Take per package instructions. Do not skip doses. Finish entire supply. (Patient not taking: Reported on 06/29/2021) 1 each 0   zaleplon (SONATA) 10 MG capsule TAKE 1 CAPSULE BY MOUTH AT BEDTIME AS NEEDED FOR SLEEP (Patient not taking: Reported on 11/30/2021) 90 capsule 0   No facility-administered medications prior to visit.    Allergies  Allergen Reactions   Shellfish Allergy Rash, Shortness Of  Breath and Swelling   Social History   Tobacco Use   Smoking status: Never   Smokeless tobacco: Never  Vaping Use   Vaping Use: Never used  Substance Use Topics   Alcohol use: Not Currently    Alcohol/week: 10.0 standard drinks of alcohol    Types: 10 Standard drinks or equivalent per week    Comment: Varies sometimes not at all   Drug use: No    Immunization History  Administered Date(s) Administered   Fluad Quad(high Dose 65+) 11/21/2019   Influenza Whole 12/11/2010   Influenza,trivalent, recombinat, inj, PF 12/11/2010, 12/28/2011, 12/15/2012   Moderna Covid-19 Vaccine Bivalent Booster 53yrs & up 05/15/2019, 06/12/2019   Moderna SARS-COV2 Booster Vaccination 01/16/2020, 07/10/2020, 01/19/2021   Pneumococcal Polysaccharide-23 12/28/2011   Pneumococcal-Unspecified 07/04/2017   Tdap 07/05/2014, 05/12/2017   Zoster, Unspecified 05/15/2017, 08/13/2017      Objective:  BP 120/62 (BP Location: Left Arm, Patient Position: Sitting)   Pulse 66   Temp 98 F (36.7 C) (Temporal)   Resp 14   Ht 5' 9.5" (1.765 m)   Wt 179 lb (81.2 kg)   SpO2 93%   BMI 26.05 kg/m  Body mass index is 26.05 kg/m.  He  is a very cordial and polite person who was a pleasure to meet.  Gen: NAD, resting comfortably,  HEENT: Mucous membranes are moist. Sclera conjunctiva and lids grossly normal Neck: no thyromegaly, no cervical lymphadenopathy Ext: moderate edema with discoloration of ankles. Skin: warm, dry Neuro: grossly intact Weak unsteady  knee upon standing  Results for orders placed or performed in visit on 05/14/21  Hepatitis C antibody  Result Value Ref Range   Hepatitis C Ab NON-REACTIVE NON-REACTIVE   SIGNAL TO CUT-OFF <0.02 <1.00  CBC with Differential/Platelet  Result Value Ref Range   WBC 5.3 4.0 - 10.5 K/uL   RBC 4.72 4.22 - 5.81 Mil/uL   Hemoglobin 14.2 13.0 - 17.0 g/dL   HCT 41.7 39.0 - 52.0 %   MCV 88.3 78.0 - 100.0 fl   MCHC 34.0 30.0 - 36.0 g/dL   RDW 13.9 11.5 - 15.5 %   Platelets 227.0 150.0 - 400.0 K/uL   Neutrophils Relative % 58.8 43.0 - 77.0 %   Lymphocytes Relative 23.5 12.0 - 46.0 %   Monocytes Relative 10.2 3.0 - 12.0 %   Eosinophils Relative 6.2 (H) 0.0 - 5.0 %   Basophils Relative 1.3 0.0 - 3.0 %   Neutro Abs 3.1 1.4 - 7.7 K/uL   Lymphs Abs 1.2 0.7 - 4.0 K/uL   Monocytes Absolute 0.5 0.1 - 1.0 K/uL   Eosinophils Absolute 0.3 0.0 - 0.7 K/uL   Basophils Absolute 0.1 0.0 - 0.1 K/uL  Comprehensive metabolic panel  Result Value Ref Range   Sodium 137 135 - 145 mEq/L   Potassium 4.4 3.5 - 5.1 mEq/L   Chloride 103 96 - 112 mEq/L   CO2 25 19 - 32 mEq/L   Glucose, Bld 103 (H) 70 - 99 mg/dL   BUN 21 6 - 23 mg/dL   Creatinine, Ser 1.03 0.40 - 1.50 mg/dL   Total Bilirubin 0.6 0.2 - 1.2 mg/dL   Alkaline Phosphatase 73 39 - 117 U/L   AST 18 0 - 37 U/L   ALT 23 0 - 53 U/L   Total Protein 6.9 6.0 - 8.3 g/dL   Albumin 4.6 3.5 - 5.2 g/dL   GFR 73.94 >60.00 mL/min   Calcium 9.6 8.4 - 10.5 mg/dL  Hemoglobin A1c  Result Value Ref Range   Hgb A1c MFr Bld 5.9 4.6 - 6.5 %  Lipid panel  Result Value Ref Range   Cholesterol 169 0 - 200 mg/dL   Triglycerides 182.0 (H) 0.0 - 149.0 mg/dL   HDL 59.40 >39.00 mg/dL   VLDL 36.4 0.0 - 40.0 mg/dL   LDL Cholesterol 73 0 - 99 mg/dL   Total CHOL/HDL Ratio 3    NonHDL 109.23

## 2021-11-30 NOTE — Patient Instructions (Addendum)
It was a pleasure seeing you today!  Today the plan is...  Stop citalopram ok to take just half pill for a week  Sign release of information at the check out desk for Prior specialist records [facility] and surveillance imaging  Try alternative med to requip just 1 of the 2 (pramipexole and rotigitone)  Discontinue valerian root.   We will send to neurology  for the restless legs.   Loralee Pacas, MD   No follow-ups on file.    - Please bring all your medicines to your next appointment. This is the best way for me to know exactly what you're taking.  - If your condition begins to worsen or become severe:  go to the ER. - If your condition fails to resolve or you have other questions / concerns: please contact me via phone [phone] or MyChart messaging.     IF you received an x-ray today, you will receive an invoice from [facility]. Please contact [facility] at [phone] with questions or concerns regarding your invoice.    IF you received labwork today, you will receive an invoice from [place]. Please contact [facility] at [phone] with questions or concerns regarding your invoice.    Our billing staff will not be able to assist you with questions regarding bills from these companies.   You will be contacted with the lab results as soon as they are available. The fastest way to get your results is to activate your My Chart account. Instructions are located on the last page of this paperwork. If you have not heard from [place] regarding the results in 2 weeks, please contact this office. For any labs or imaging tests, we will call you if the results are significantly abnormal.  Most normal results will be posted to myChart as soon as they are available and I will comment on them there within 2-3 business days.

## 2021-11-30 NOTE — Assessment & Plan Note (Signed)
Sent bipap supplies Sent to neuro

## 2021-11-30 NOTE — Assessment & Plan Note (Signed)
Progressive Worsening of Restless Legs Syndrome Despite Increasing Doses of Ropinirole  The patient is a 71 year old male with a long-standing history of restless legs syndrome (RLS) that has been progressively worsening despite increasing doses of ropinirole. This could be indicative of augmentation, a common side effect of long-term dopaminergic treatment for RLS, characterized by an earlier onset of symptoms, increased intensity, and spread to other body parts. The patient's history of insomnia, prostate cancer, renal cancer, and hypertension, along with his extensive medication list, could also be contributing to his symptoms. The patient's report of his heart racing when his restless legs flare up is concerning and warrants further investigation. The differential diagnosis includes augmentation due to long-term use of ropinirole, iron deficiency anemia, peripheral neuropathy, renal failure, diabetes mellitus, Parkinson's disease, spinal cord lesions, medication-induced RLS, and sleep apnea.  Dx:  Serum ferritin and hemoglobin levels to rule out iron deficiency anemia Nerve conduction studies and electromyography to evaluate for peripheral neuropathy Kidney function tests to assess for renal failure Blood glucose levels and hemoglobin A1c to rule out diabetes mellitus Neurological examination to evaluate for Parkinson's disease Spinal MRI to assess for spinal cord lesions Review of medication list and timing of symptom onset to evaluate for medication-induced RLS Sleep study to assess for sleep apnea Tx:  If augmentation due to long-term use of ropinirole is confirmed, consider reducing the dose of ropinirole or switching to a different medication for RLS If iron deficiency anemia is confirmed, consider iron supplementation If peripheral neuropathy is confirmed, consider medications such as gabapentin or pregabalin for symptom management If renal failure is confirmed, consider dialysis or  kidney transplant If diabetes mellitus is confirmed, consider insulin therapy or oral hypoglycemic agents If Parkinson's disease is confirmed, consider levodopa-carbidopa therapy If spinal cord lesions are confirmed, consider surgical intervention or radiation therapy If medication-induced RLS is confirmed, consider discontinuing the offending medication If sleep apnea is confirmed, consider continuous positive airway pressure (CPAP) therapy or lifestyle modifications such as weight loss and smoking cessation.

## 2021-12-01 ENCOUNTER — Other Ambulatory Visit (INDEPENDENT_AMBULATORY_CARE_PROVIDER_SITE_OTHER): Payer: Medicare Other

## 2021-12-01 ENCOUNTER — Other Ambulatory Visit: Payer: Self-pay

## 2021-12-01 DIAGNOSIS — G2581 Restless legs syndrome: Secondary | ICD-10-CM | POA: Diagnosis not present

## 2021-12-01 DIAGNOSIS — N39498 Other specified urinary incontinence: Secondary | ICD-10-CM | POA: Diagnosis not present

## 2021-12-01 DIAGNOSIS — M7989 Other specified soft tissue disorders: Secondary | ICD-10-CM | POA: Diagnosis not present

## 2021-12-01 DIAGNOSIS — R7303 Prediabetes: Secondary | ICD-10-CM | POA: Diagnosis not present

## 2021-12-01 LAB — FERRITIN: Ferritin: 134.1 ng/mL (ref 22.0–322.0)

## 2021-12-01 LAB — LIPID PANEL
Cholesterol: 148 mg/dL (ref 0–200)
HDL: 55.1 mg/dL (ref >39.00)
LDL Cholesterol: 63 mg/dL (ref 0–99)
NonHDL: 92.88
Total CHOL/HDL Ratio: 3
Triglycerides: 149 mg/dL (ref 0.0–149.0)
VLDL: 29.8 mg/dL (ref 0.0–40.0)

## 2021-12-01 LAB — CBC WITH DIFFERENTIAL/PLATELET
Basophils Absolute: 0.1 10*3/uL (ref 0.0–0.1)
Basophils Relative: 1.4 % (ref 0.0–3.0)
Eosinophils Absolute: 0.3 10*3/uL (ref 0.0–0.7)
Eosinophils Relative: 6.4 % — ABNORMAL HIGH (ref 0.0–5.0)
HCT: 40.7 % (ref 39.0–52.0)
Hemoglobin: 13.6 g/dL (ref 13.0–17.0)
Lymphocytes Relative: 22.5 % (ref 12.0–46.0)
Lymphs Abs: 1.1 10*3/uL (ref 0.7–4.0)
MCHC: 33.5 g/dL (ref 30.0–36.0)
MCV: 89.4 fl (ref 78.0–100.0)
Monocytes Absolute: 0.5 10*3/uL (ref 0.1–1.0)
Monocytes Relative: 10.7 % (ref 3.0–12.0)
Neutro Abs: 2.9 10*3/uL (ref 1.4–7.7)
Neutrophils Relative %: 59 % (ref 43.0–77.0)
Platelets: 198 10*3/uL (ref 150.0–400.0)
RBC: 4.55 Mil/uL (ref 4.22–5.81)
RDW: 13.6 % (ref 11.5–15.5)
WBC: 5 10*3/uL (ref 4.0–10.5)

## 2021-12-01 LAB — COMPREHENSIVE METABOLIC PANEL
ALT: 21 U/L (ref 0–53)
AST: 16 U/L (ref 0–37)
Albumin: 4 g/dL (ref 3.5–5.2)
Alkaline Phosphatase: 73 U/L (ref 39–117)
BUN: 23 mg/dL (ref 6–23)
CO2: 27 mEq/L (ref 19–32)
Calcium: 9.2 mg/dL (ref 8.4–10.5)
Chloride: 103 mEq/L (ref 96–112)
Creatinine, Ser: 0.89 mg/dL (ref 0.40–1.50)
GFR: 86.9 mL/min (ref >60.00)
Glucose, Bld: 86 mg/dL (ref 70–99)
Potassium: 4.2 mEq/L (ref 3.5–5.1)
Sodium: 138 mEq/L (ref 135–145)
Total Bilirubin: 0.6 mg/dL (ref 0.2–1.2)
Total Protein: 6.6 g/dL (ref 6.0–8.3)

## 2021-12-01 LAB — HEMOGLOBIN A1C: Hgb A1c MFr Bld: 6 % (ref 4.6–6.5)

## 2021-12-01 NOTE — Progress Notes (Unsigned)
Initial neurology clinic note  Jay Fox MRN: [id number] DOB: May 14, 1950  Referring provider: Loralee Pacas, MD  Primary care provider: Loralee Pacas, MD  Reason for consult:  restless legs  Subjective:  This is [name], a 71 y.o. right-handed male with a medical history of HTN, prostate cancer, renal cell carcinoma, CKD, restless leg syndrome, chronic low back pain, OSA (on BiPAP), prediabetes, depression, anxiety who presents to neurology clinic with worsening restless leg syndrome. The patient is alone today.  Patient has had RLS for 10-12 years. He describes it as a creepy crawly feeling in both feet (on bottom). He feels the urge to move his feet. If he gets up and walks, the sensation goes away. Poor sleep makes his symptoms worse. He takes ropinirole 5 mg at 9:30 pm and if his symptoms return or does not work, he takes another 5 mg (usually will be 11pm to 12am). Most nights he now takes both 5 mg doses (10 mg total). He thinks he has been on the ropinirole for about 2 years. He was on gabapentin and an under the tongue medication then. Gabapentin did not help. He does not remember the dose. He was started on 2.5 mg at that time.  Patient was seen by his PCP, Dr. Randol Kern on 11/30/21 for his symptoms. Per the clinic note, patient has a 10-12 year history of RLS. His symptoms have been progressively worsening despite increasing doses of ropinirole. He was taking 10 mg daily at that clinic visit. It was recommended the patient wean off ropinirole at some point. Labs and MRI lumbar spine was ordered. He was prescribed on pramipexole 0.5 mg TID and rotigotine patch (3 mg) daily. He has not gotten either yet. One was covered by insurance and one was not (he does not remember which today). He was also told to stop citalopram. Patient also mentioned a vein issue that has popped up over the last 6 months. He will be seeing a vascular specialist for this. Patient was referred to  neurology for further work up and management of RLS.  Do RLS symptoms appear earlier than when ropinirole was first started? Sometimes yes, but not all the time. Over the last 6 months or so, he now gets the symptoms when taking a nap or sitting for long periods. Are higher doses now needed or do you need to take the medication earlier to control symptoms? Yes Has intensity of symptoms worsened since starting ropinirole? No change in intensity, but happening more. Have symptoms spread to other body parts since starting ropinirole? No  He denies numbness and tingling when not having RLS symptoms (during the day). He does have a history of plantar fasciitis (bilateral) and Morton's neuroma on right foot (early 2000s).  He describes poor sleep due to sleep apnea but also insomnia. He previously saw sleep medicine in [place], but has no one here. He is interested in establishing care.  Patient also mentions achy teeth on the left side of mouth since 2016. He had a burning mouth for a while. A neurologist in [place] put his on gabapentin, which did not help. This resolved, but he recently had caps put on and the tooth pain has returned. The burning mouth has not returned.  Patient is here to discuss better options for RLS.  MEDICATIONS:  Outpatient Encounter Medications as of 12/02/2021  Medication Sig   albuterol (PROVENTIL) (2.5 MG/3ML) 0.083% nebulizer solution Take 3 mLs (2.5 mg total) by nebulization every 6 (  six) hours as needed for wheezing or shortness of breath.   albuterol (VENTOLIN HFA) 108 (90 Base) MCG/ACT inhaler Inhale 1-2 puffs into the lungs every 6 (six) hours as needed.   amLODipine (NORVASC) 5 MG tablet Take 2 tablets by mouth once daily   Ascorbic Acid (VITAMIN C PO) Take 1-2 tablets by mouth 2 (two) times daily.   budesonide-formoterol (SYMBICORT) 160-4.5 MCG/ACT inhaler Inhale 2 puffs into the lungs 2 (two) times daily.   calcium carbonate (TUMS - DOSED IN MG ELEMENTAL  CALCIUM) 500 MG chewable tablet Chew 1 tablet by mouth daily.   Cholecalciferol (VITAMIN D PO) Take 1 tablet by mouth daily.   Cyanocobalamin (VITAMIN B-12 PO) Take 1 tablet by mouth daily.   diclofenac Sodium (VOLTAREN) 1 % GEL Place onto the skin.   fluticasone (FLONASE) 50 MCG/ACT nasal spray Place into the nose.   irbesartan (AVAPRO) 300 MG tablet Take 1 tablet (300 mg total) by mouth daily.   Multiple Vitamin (MULTIVITAMIN) tablet Take 1 tablet by mouth daily.   Omega-3 Fatty Acids (FISH OIL) 1000 MG CAPS Take 1,000 mg by mouth in the morning and at bedtime.   pramipexole (MIRAPEX) 0.5 MG tablet Take 1 tablet (0.5 mg total) by mouth 3 (three) times daily.   Respiratory Therapy Supplies MISC Filters Hoses tubing masks headgear cleaning supplies for dreamwear and  philips respironics bipap machine.   ropinirole (REQUIP) 5 MG tablet Take 1 tablet (5 mg total) by mouth at bedtime.   Spacer/Aero-Holding Chambers (AEROCHAMBER MV) inhaler Use as directed with albuterol inhaler.   TURMERIC PO Take 1,000 mg by mouth daily.   zolpidem (AMBIEN) 10 MG tablet Take by mouth.   doxycycline (VIBRA-TABS) 100 MG tablet Take 1 tablet (100 mg total) by mouth 2 (two) times daily. (Patient not taking: Reported on 12/02/2021)   Rotigotine 3 MG/24HR PT24 Place 1 patch (3 mg total) onto the skin daily. (Patient not taking: Reported on 12/02/2021)   sildenafil (REVATIO) 20 MG tablet SMARTSIG:1-5 Tablet(s) By Mouth Daily PRN (Patient not taking: Reported on 12/02/2021)   [DISCONTINUED] citalopram (CELEXA) 10 MG tablet Take 1 tablet (10 mg total) by mouth daily. (Patient not taking: Reported on 12/02/2021)   [DISCONTINUED] VALERIAN ROOT PO Take 2 capsules by mouth at bedtime. (Patient not taking: Reported on 12/02/2021)   No facility-administered encounter medications on file as of 12/02/2021.    PAST MEDICAL HISTORY: Past Medical History:  Diagnosis Date   Allergy 1953   Anxiety    Arthritis    Asthma    BPH  (benign prostatic hypertrophy)    BPH (benign prostatic hypertrophy)    Cancer (HCC)    Cataract    Chronic kidney disease    Depression    GERD (gastroesophageal reflux disease)    History of kidney cancer    History of prostate cancer 11/30/2021   Hypertension    Leg swelling 11/30/2021   Noticed early 2023 with ferritin deposition.    OSA (obstructive sleep apnea) 11/30/2021   Restless legs 11/30/2021   Seasonal allergies    Sleep apnea     PAST SURGICAL HISTORY: Past Surgical History:  Procedure Laterality Date   NASAL SINUS SURGERY  2019   RENAL CRYOABLATION     TONSILLECTOMY      ALLERGIES: Allergies  Allergen Reactions   Shellfish Allergy Rash, Shortness Of Breath and Swelling    FAMILY HISTORY: Family History  Problem Relation Age of Onset   Emphysema Mother    Liver  cancer Mother    Pancreatic cancer Mother    Anxiety disorder Mother    Cancer Mother    Hypertension Mother    Multiple myeloma Father    Cancer Father    Hypertension Father    Colon cancer Paternal Grandmother    Colon cancer Paternal Grandfather     SOCIAL HISTORY: Social History   Tobacco Use   Smoking status: Never   Smokeless tobacco: Never  Vaping Use   Vaping Use: Never used  Substance Use Topics   Alcohol use: Not Currently    Alcohol/week: 10.0 standard drinks of alcohol    Types: 10 Standard drinks or equivalent per week    Comment: Varies sometimes not at all   Drug use: No   Social History   Social History Narrative   Right handed   Caffeine none   Lives in one story home    Objective:  Vital Signs:  BP 135/69   Pulse 64   Ht 5' 10"  (1.778 m)   Wt 181 lb (82.1 kg)   SpO2 97%   BMI 25.97 kg/m   General: General appearance: Awake and alert. No distress. Cooperative with exam.  Skin: No obvious rash or jaundice. HEENT: Atraumatic. Anicteric. Lungs: Non-labored breathing on room air  Extremities: No edema. No obvious deformity.  Psych: Affect  appropriate.  Neurological: Mental Status: Alert. Speech fluent. No pseudobulbar affect Cranial Nerves: CNII: No RAPD. Visual fields intact. CNIII, IV, VI: PERRL. No nystagmus. EOMI. CN V: Facial sensation intact bilaterally to fine touch. CN VII: Facial muscles symmetric and strong. No ptosis at rest. CN VIII: Hears finger rub well bilaterally. CN IX: No hypophonia. CN X: Palate elevates symmetrically. CN XI: Full strength shoulder shrug bilaterally. CN XII: Tongue protrusion full and midline. No atrophy or fasciculations. No significant dysarthria Motor: Tone is normal. No fasciculations in extremities. No atrophy.  Individual muscle group testing (MRC grade out of 5):  Movement     Neck flexion 5    Neck extension 5     Right Left   Shoulder abduction 5 5   Elbow flexion 5 5   Elbow extension 5 5   Finger abduction - FDI 5 5   Finger abduction - ADM 5 5   Finger extension 5 5   Finger distal flexion - 2/3 5 5    Finger distal flexion - 4/5 5 5    Thumb flexion - FPL 5 5   Thumb abduction - APB 5 5    Hip flexion 5 5   Hip adduction 5 5   Hip abduction 5 5   Knee extension 5 5   Knee flexion 5 5   Dorsiflexion 5 5   Plantarflexion 5 5   Great toe extension 5 5   Great toe flexion 5 5     Reflexes:  Right Left   Bicep 2+ 2+   Tricep 2+ 2+   BrRad 2+ 2+   Knee 3+ 3+   Ankle 2+ 2+    Pathological Reflexes: Babinski: Flexor on right, mute to ?extensor on left Hoffman: absent bilaterally Troemner: absent bilaterally Sensation: Pinprick: Intact in all extremities Vibration: Intact in bilateral upper extremities. 13 seconds in right ankle, 5 seconds in right great toe, 11 seconds in left ankle, 3 seconds in left great toe Proprioception: Intact in bilateral great toes Coordination: Intact finger-to- nose-finger bilaterally. Romberg with mild sway. RAM normal in bilateral upper extremities and lower extremities. Gait: Able to rise from chair  with arms crossed  unassisted. Normal, narrow-based gait. Able to tandem walk. Able to walk on toes and heels.  Labs and Imaging review: Internal labs: Lab Results  Component Value Date   HGBA1C 6.0 12/01/2021   12/01/21: Normal or unremarkable: CBC, CMP, Ferritin 134.1   Assessment/Plan:  Girolamo Lortie is a 71 y.o. male who presents for evaluation of restless leg syndrome. He has a relevant medical history of HTN, prostate cancer, renal cell carcinoma, CKD, restless leg syndrome, chronic low back pain, OSA (on BiPAP), prediabetes, depression, anxiety. His neurological examination is pertinent for hyperreflexia in lower extremities and diminished sensation distally (mostly symmetric but left maybe more than right). Available diagnostic data is significant for HbA1c of 6.0 and ferritin of 134.1 (normal).   Overall, cause of patient's worsening RLS is not completely clear currently. Refractory RLS vs augmentation are both possibilities. He does not endorse all of normal symptoms associated with augmentation. Other contributing factors are also possible such as neuropathy (?small fiber from pre-diabetes) or radiculopathy (given chronic low back pain). I will look for treatable causes of neuropathy as well as iron studies (but ferritin was normal). Patient does currently take iron supplement. He does not know how much.  Patient's hyper-reflexia in his lower extremities are likely related to his prior cervical spine injury, but does not seem to be causing significant symptoms otherwise. We discussed warning signs and to monitor for new symptoms.   PLAN: -Blood work: iron, TIBC, sat %, B12, IFE, SPEP -Will precede with EMG of LLE as this can evaluate for lumbar radiculopathy and peripheral neuropathy. -Will hold off on MRI lumbar spine for now. May reconsider if EMG suggests radiculopathy -Hold off on starting new medications ordered by PCP (pramipexole 0.5 mg TID and rotigotine patch 3 mg daily). These could worsen  symptoms if augmentation is cause of worsening. -Agree with stopping citalopram as SSRI can worsen RLS -Continue iron supplementation for now. Patient will message me to let me know how much he is taking. -Continue Ropinirole 5 mg 1-3 hours before bedtime (will try not to do 2). May consider further tapering. -Start Lyrica 50 mg 1-3 hours before bedtime -Sleep medicine referral to establish care for OSA and insomnia  -Return to clinic 3 months  The impression above as well as the plan as outlined below were extensively discussed with the patient who voiced understanding. All questions were answered to their satisfaction.  When available, results of the above investigations and possible further recommendations will be communicated to the patient via telephone/MyChart. Patient to call office if not contacted after expected testing turnaround time.   Total time spent reviewing records, interview, history/exam, documentation, and coordination of care on day of encounter:  70 min   Thank you for allowing me to participate in patient's care.  If I can answer any additional questions, I would be pleased to do so.  Kai Levins, MD   CC: Loralee Pacas, [place] [id number]  CC: Referring provider: Loralee Pacas, [address],  [place] [id number]

## 2021-12-02 ENCOUNTER — Encounter: Payer: Self-pay | Admitting: Neurology

## 2021-12-02 ENCOUNTER — Ambulatory Visit: Payer: Medicare Other | Admitting: Neurology

## 2021-12-02 ENCOUNTER — Telehealth: Payer: Self-pay | Admitting: Internal Medicine

## 2021-12-02 ENCOUNTER — Other Ambulatory Visit (INDEPENDENT_AMBULATORY_CARE_PROVIDER_SITE_OTHER): Payer: Medicare Other

## 2021-12-02 VITALS — BP 135/69 | HR 64 | Ht 70.0 in | Wt 181.0 lb

## 2021-12-02 DIAGNOSIS — C61 Malignant neoplasm of prostate: Secondary | ICD-10-CM | POA: Diagnosis not present

## 2021-12-02 DIAGNOSIS — R292 Abnormal reflex: Secondary | ICD-10-CM | POA: Diagnosis not present

## 2021-12-02 DIAGNOSIS — G2581 Restless legs syndrome: Secondary | ICD-10-CM

## 2021-12-02 DIAGNOSIS — R209 Unspecified disturbances of skin sensation: Secondary | ICD-10-CM | POA: Diagnosis not present

## 2021-12-02 DIAGNOSIS — G4700 Insomnia, unspecified: Secondary | ICD-10-CM

## 2021-12-02 DIAGNOSIS — M545 Low back pain, unspecified: Secondary | ICD-10-CM

## 2021-12-02 DIAGNOSIS — G8929 Other chronic pain: Secondary | ICD-10-CM

## 2021-12-02 DIAGNOSIS — G4733 Obstructive sleep apnea (adult) (pediatric): Secondary | ICD-10-CM

## 2021-12-02 LAB — VITAMIN B12: Vitamin B-12: 470 pg/mL (ref 211–911)

## 2021-12-02 MED ORDER — PREGABALIN 50 MG PO CAPS: 50.0000 mg | ORAL_CAPSULE | Freq: Every day | ORAL | 2 refills | Status: DC

## 2021-12-02 NOTE — Patient Instructions (Signed)
I saw you today for restless leg syndrome. I want to do more investigations into why your symptoms are worsening. It could be your ropinirole being too much or another cause.  I will get blood work today.  We will hold off on the MRI for now. We will get a muscle and nerve test called an EMG instead. This can show a pinched nerve in the back or a problem of the nerves in your feet. I am giving you more information about this test below.  Hold off on starting the other medications prescribed by Dr. Randol Kern. I would like you to start Lyrica 50 mg 1-3 hours before bedtime instead.  Continue ropinirole for now. Try to take just 1 tablet (5 mg) if able.  Continue your iron supplement, but please send me a message about how much you are currently taking.  I would like to see you in clinic again in about 3 months after your testing is complete. I will be in touch when I have your results though. Please let me know if you have any questions or concerns in the meantime.   The physicians and staff at [facility] are committed to providing excellent care. You may receive a survey requesting feedback about your experience at our office. We strive to receive "very good" responses to the survey questions. If you feel that your experience would prevent you from giving the office a "very good " response, please contact our office to try to remedy the situation. We may be reached at [phone]. Thank you for taking the time out of your busy day to complete the survey.  Kai Levins, MD [facility]  ELECTROMYOGRAM AND NERVE CONDUCTION STUDIES (EMG/NCS) INSTRUCTIONS  How to Prepare The neurologist conducting the EMG will need to know if you have certain medical conditions. Tell the neurologist and other EMG lab personnel if you: Have a pacemaker or any other electrical medical device Take blood-thinning medications Have hemophilia, a blood-clotting disorder that causes prolonged  bleeding Bathing Take a shower or bath shortly before your exam in order to remove oils from your skin. Don't apply lotions or creams before the exam.  What to Expect You'll likely be asked to change into a hospital gown for the procedure and lie down on an examination table. The following explanations can help you understand what will happen during the exam.  Electrodes. The neurologist or a technician places surface electrodes at various locations on your skin depending on where you're experiencing symptoms. Or the neurologist may insert needle electrodes at different sites depending on your symptoms.  Sensations. The electrodes will at times transmit a tiny electrical current that you may feel as a twinge or spasm. The needle electrode may cause discomfort or pain that usually ends shortly after the needle is removed. If you are concerned about discomfort or pain, you may want to talk to the neurologist about taking a short break during the exam.  Instructions. During the needle EMG, the neurologist will assess whether there is any spontaneous electrical activity when the muscle is at rest - activity that isn't present in healthy muscle tissue - and the degree of activity when you slightly contract the muscle.  He or she will give you instructions on resting and contracting a muscle at appropriate times. Depending on what muscles and nerves the neurologist is examining, he or she may ask you to change positions during the exam.  After your EMG You may experience some temporary, minor bruising where  the needle electrode was inserted into your muscle. This bruising should fade within several days. If it persists, contact your primary care doctor.

## 2021-12-02 NOTE — Telephone Encounter (Signed)
FYI  Patient states: -During new patient visit with Dr. Berdine Addison, neurology, on 12/02/21 he was informed to hold off on MRI due to provider wanting to complete a different type of test first.  - He was told depending on result of that test, another MRI would be ordered  - He just wanted to let Dr. Randol Kern know

## 2021-12-03 LAB — IRON, TOTAL/TOTAL IRON BINDING CAP
%SAT: 26 % (calc) (ref 20–48)
Iron: 94 ug/dL (ref 50–180)
TIBC: 357 mcg/dL (calc) (ref 250–425)

## 2021-12-04 ENCOUNTER — Other Ambulatory Visit: Payer: Self-pay | Admitting: Internal Medicine

## 2021-12-04 LAB — METHYLMALONIC ACID, SERUM: Methylmalonic Acid, Quant: 151 nmol/L (ref 87–318)

## 2021-12-04 NOTE — Progress Notes (Signed)
Patient request rf on  z[place], I asked nurse to arrange appointment for [place] to transfer controlled substance mgmt from Dr. Orland Mustard to me and develop a long term plan for this.  Agreed to write 30 days at a time bridge until we can make this happen.

## 2021-12-06 ENCOUNTER — Telehealth: Payer: Self-pay | Admitting: Internal Medicine

## 2021-12-06 DIAGNOSIS — G4700 Insomnia, unspecified: Secondary | ICD-10-CM

## 2021-12-07 LAB — IMMUNOFIXATION ELECTROPHORESIS
IgG (Immunoglobin G), Serum: 788 mg/dL (ref 600–1540)
IgM, Serum: 87 mg/dL (ref 50–300)
Immunoglobulin A: 140 mg/dL (ref 70–320)

## 2021-12-07 LAB — PROTEIN ELECTROPHORESIS, SERUM
Albumin ELP: 4.4 g/dL (ref 3.8–4.8)
Alpha 1: 0.3 g/dL (ref 0.2–0.3)
Alpha 2: 0.7 g/dL (ref 0.5–0.9)
Beta 2: 0.4 g/dL (ref 0.2–0.5)
Beta Globulin: 0.4 g/dL (ref 0.4–0.6)
Gamma Globulin: 0.7 g/dL — ABNORMAL LOW (ref 0.8–1.7)
Total Protein: 6.8 g/dL (ref 6.1–8.1)

## 2021-12-08 MED ORDER — ZOLPIDEM TARTRATE 10 MG PO TABS
10.0000 mg | ORAL_TABLET | Freq: Every evening | ORAL | 0 refills | Status: DC | PRN
  Filled 2021-12-08: qty 30, 30d supply, fill #0

## 2021-12-08 NOTE — Telephone Encounter (Signed)
Patient called for status of Rx request.

## 2021-12-09 ENCOUNTER — Other Ambulatory Visit: Payer: Self-pay

## 2021-12-09 ENCOUNTER — Telehealth: Payer: Self-pay | Admitting: Internal Medicine

## 2021-12-09 MED ORDER — ZOLPIDEM TARTRATE 10 MG PO TABS: 10.0000 mg | ORAL_TABLET | Freq: Every evening | ORAL | 0 refills | Status: DC | PRN

## 2021-12-09 NOTE — Telephone Encounter (Signed)
Error

## 2021-12-09 NOTE — Telephone Encounter (Signed)
Patient states: - Ambien should have been sent to [facility] at instead of [facility] in [place]  - He doesn't know how he chose the wrong pharmacy  Patient requests:  -Ambien 10 mg be sent to [facility], [place] - [address]  [place] [place] [address]

## 2021-12-09 NOTE — Telephone Encounter (Signed)
Patient states: -Although he received Lorrin Mais, it is a 30 day supply versus his usual 90 day supply  - He got a phone call stating he needs to make an OV to sign some paperwork   Patient has been scheduled for 12/14/21 @ 1pm.

## 2021-12-10 ENCOUNTER — Other Ambulatory Visit: Payer: Self-pay

## 2021-12-10 MED ORDER — ZOLPIDEM TARTRATE 10 MG PO TABS: 10.0000 mg | ORAL_TABLET | Freq: Every evening | ORAL | 0 refills | Status: DC | PRN

## 2021-12-10 NOTE — Addendum Note (Signed)
Addended by: Loralee Pacas on: 12/10/2021 02:50 PM   Modules accepted: Orders

## 2021-12-10 NOTE — Telephone Encounter (Signed)
Spoke with patient and informed him that this prescription has been sent in to the [facility] on [address].

## 2021-12-11 ENCOUNTER — Encounter: Payer: Self-pay | Admitting: Adult Health

## 2021-12-11 ENCOUNTER — Ambulatory Visit (INDEPENDENT_AMBULATORY_CARE_PROVIDER_SITE_OTHER): Payer: Medicare Other | Admitting: Adult Health

## 2021-12-11 DIAGNOSIS — G4733 Obstructive sleep apnea (adult) (pediatric): Secondary | ICD-10-CM

## 2021-12-11 DIAGNOSIS — G2581 Restless legs syndrome: Secondary | ICD-10-CM | POA: Diagnosis not present

## 2021-12-11 DIAGNOSIS — G4700 Insomnia, unspecified: Secondary | ICD-10-CM | POA: Diagnosis not present

## 2021-12-11 NOTE — Assessment & Plan Note (Signed)
Difficult to control restless leg symptoms.  Continue to follow-up with neurology.  Medications are being adjusted currently.  We will get BiPAP download to make sure sleep apnea is optimally controlled which can also aggravate restless leg. Discussed use of alcohol with current medications as may be aggravating symptoms as well.

## 2021-12-11 NOTE — Progress Notes (Signed)
$'@Patient'L$  ID: Jay Fox, male    DOB: 1950/07/29, 71 y.o.   MRN: [id number]  Chief Complaint  Patient presents with   Consult    Referring provider: Shellia Carwin, MD  HPI: 71 year old male seen for sleep consult December 11, 2021 to establish for sleep apnea Medical history significant for restless leg syndrome  TEST/EVENTS :   12/11/2021 Sleep consult  Patient presents for a sleep consult.  Kindly referred by primary care provider Dr. Randol Kern.  Patient says he was diagnosed with sleep apnea in 2017 living in [place].  He had a sleep study by Karn Cassis MD in [place].  (Phone # [phone]) .  Patient says he wears his BiPAP every single night.  Patient says he did try CPAP initially but could not tolerate.  BiPAP helps him so much because he can actually breathe while he is sleeping.  Has chronic nasal stuffiness.  Patient says usually gets in about 6-7 sometimes 8 hours each night.  He uses [employer] [facility].  He is currently using the DreamWear nasal mask.  He says it is most comfortable for him.  He does use [employer] dream station.  We did look up his DreamStation and it is part of the [employer] recall.  I did notify him about this.  Patient says he recently moved back to the area from [place] about 2 years ago.  Patient has not been able to get any supplies.  His tubing and mask are about 70 years old.  Patient is wanting to establish so she can get new mask and supplies.  Patient says he cannot sleep without his BiPAP so will most likely need a new machine since his is under recall.  Patient says he also has chronic insomnia and restless leg syndrome.  He is currently being followed by neurology for his restless leg taking Requip and Lyrica which were recently changed.  He also is recently been changed from [facility] to Ambien by his primary care provider.  Patient says typically these medicines will help him to get to sleep and sleep all night with his BiPAP.  He  does have some daytime grogginess first thing in the morning. Typically goes to bed about 11 PM.  Gets up 1 or 2 times each night.  Does take him a while to go to sleep.  Gets up about 8 AM.  Patient is retired.  No significant caffeine intake.  No history of congestive heart failure or stroke. Epworth score is 2 out of 24.  Has some mild sleepiness if he sits down to rest or in the afternoon hours.   Medical history significant for hypertension, asthma, prostate cancer, kidney cancer, chronic allergies, chronic kidney disease, sleep apnea, restless leg syndrome, anxiety, depression  Family history positive for emphysema and cancer  Surgical history prostate surgery in 2020, left kidney ablation 2021, sinus surgery 2019, tonsillectomy  Social history patient is married.  He is retired Clinical biochemist.  Has 3 adult children.  Never smoker.  Drinks alcohol 5-10 drinks per week.  Allergies  Allergen Reactions   Shellfish Allergy Rash, Shortness Of Breath and Swelling    Immunization History  Administered Date(s) Administered   Fluad Quad(high Dose 65+) 11/21/2019   Influenza Whole 12/11/2010   Influenza,trivalent, recombinat, inj, PF 12/11/2010, 12/28/2011, 12/15/2012   Moderna Covid-19 Vaccine Bivalent Booster 21yr & up 05/15/2019, 06/12/2019   Moderna SARS-COV2 Booster Vaccination 01/16/2020, 07/10/2020, 01/19/2021   Pneumococcal Polysaccharide-23 12/28/2011   Pneumococcal-Unspecified 07/04/2017  Tdap 07/05/2014, 05/12/2017   Zoster, Unspecified 05/15/2017, 08/13/2017    Past Medical History:  Diagnosis Date   Allergy 1954   Anxiety    Arthritis    Asthma    BPH (benign prostatic hypertrophy)    BPH (benign prostatic hypertrophy)    Cancer (HCC)    Cataract    Chronic kidney disease    Depression    GERD (gastroesophageal reflux disease)    History of kidney cancer    History of prostate cancer 11/30/2021   Hypertension    Leg swelling 11/30/2021   Noticed early 2023 with  ferritin deposition.    OSA (obstructive sleep apnea) 11/30/2021   Restless legs 11/30/2021   Seasonal allergies    Sleep apnea     Tobacco History: Social History   Tobacco Use  Smoking Status Never  Smokeless Tobacco Never   Counseling given: Not Answered   Outpatient Medications Prior to Visit  Medication Sig Dispense Refill   albuterol (PROVENTIL) (2.5 MG/3ML) 0.083% nebulizer solution Take 3 mLs (2.5 mg total) by nebulization every 6 (six) hours as needed for wheezing or shortness of breath. 150 mL 1   albuterol (VENTOLIN HFA) 108 (90 Base) MCG/ACT inhaler Inhale 1-2 puffs into the lungs every 6 (six) hours as needed. 18 g 11   budesonide-formoterol (SYMBICORT) 160-4.5 MCG/ACT inhaler Inhale 2 puffs into the lungs 2 (two) times daily. 1 each 12   fluticasone (FLONASE) 50 MCG/ACT nasal spray Place into the Fox.     Spacer/Aero-Holding Chambers (AEROCHAMBER MV) inhaler Use as directed with albuterol inhaler. 1 each 0   amLODipine (NORVASC) 5 MG tablet Take 2 tablets by mouth once daily 90 tablet 0   Ascorbic Acid (VITAMIN C PO) Take 1-2 tablets by mouth 2 (two) times daily.     calcium carbonate (TUMS - DOSED IN MG ELEMENTAL CALCIUM) 500 MG chewable tablet Chew 1 tablet by mouth daily.     Cholecalciferol (VITAMIN D PO) Take 1 tablet by mouth daily.     Cyanocobalamin (VITAMIN B-12 PO) Take 1 tablet by mouth daily.     diclofenac Sodium (VOLTAREN) 1 % GEL Place onto the skin.     doxycycline (VIBRA-TABS) 100 MG tablet Take 1 tablet (100 mg total) by mouth 2 (two) times daily. (Patient not taking: Reported on 12/02/2021) 20 tablet 0   irbesartan (AVAPRO) 300 MG tablet Take 1 tablet (300 mg total) by mouth daily. 90 tablet 1   Multiple Vitamin (MULTIVITAMIN) tablet Take 1 tablet by mouth daily.     Omega-3 Fatty Acids (FISH OIL) 1000 MG CAPS Take 1,000 mg by mouth in the morning and at bedtime.     pregabalin (LYRICA) 50 MG capsule Take 1 capsule (50 mg total) by mouth at bedtime. 30  capsule 2   Respiratory Therapy Supplies MISC Filters Hoses tubing masks headgear cleaning supplies for dreamwear and  [employer]ilips respironics bipap machine. 2 each 0   ropinirole (REQUIP) 5 MG tablet Take 1 tablet (5 mg total) by mouth at bedtime. 90 tablet 1   sildenafil (REVATIO) 20 MG tablet SMARTSIG:1-5 Tablet(s) By Mouth Daily PRN (Patient not taking: Reported on 12/02/2021)     TURMERIC PO Take 1,000 mg by mouth daily.     zolpidem (AMBIEN) 10 MG tablet Take 1 tablet (10 mg total) by mouth at bedtime as needed for sleep. 30 tablet 0   zolpidem (AMBIEN) 10 MG tablet Take 1 tablet (10 mg total) by mouth at bedtime as needed for sleep.  30 tablet 0   No facility-administered medications prior to visit.     Review of Systems:   Constitutional:   No  weight loss, night sweats,  Fevers, chills,  +fatigue, or  lassitude.  HEENT:   No headaches,  Difficulty swallowing,  Tooth/dental problems, or  Sore throat,                No sneezing, itching, ear ache, nasal congestion, post nasal drip,   CV:  No chest pain,  Orthopnea, PND, swelling in lower extremities, anasarca, dizziness, palpitations, syncope.   GI  No heartburn, indigestion, abdominal pain, nausea, vomiting, diarrhea, change in bowel habits, loss of appetite, bloody stools.   Resp: No shortness of breath with exertion or at rest.  No excess mucus, no productive cough,  No non-productive cough,  No coughing up of blood.  No change in color of mucus.  No wheezing.  No chest wall deformity  Skin: no rash or lesions.  GU: no dysuria, change in color of urine, no urgency or frequency.  No flank pain, no hematuria   MS:  No joint pain or swelling.  No decreased range of motion.  No back pain.    Physical Exam  BP 120/60 (BP Location: Left Arm)   Pulse 66   Ht 5' 9.5" (1.765 m)   Wt 178 lb 9.6 oz (81 kg)   SpO2 97%   BMI 26.00 kg/m   GEN: A/Ox3; pleasant , NAD, well nourished    HEENT:  [place]/AT,   Fox-clear, THROAT-clear, no  lesions, no postnasal drip or exudate noted.  Class II-III MP airway  NECK:  Supple w/ fair ROM; no JVD; normal carotid impulses w/o bruits; no thyromegaly or nodules palpated; no lymphadenopathy.    RESP  Clear  P & A; w/o, wheezes/ rales/ or rhonchi. no accessory muscle use, no dullness to percussion  CARD:  RRR, no m/r/g, no peripheral edema, pulses intact, no cyanosis or clubbing.  GI:   Soft & nt; nml bowel sounds; no organomegaly or masses detected.   Musco: Warm bil, no deformities or joint swelling noted.   Neuro: alert, no focal deficits noted.    Skin: Warm, no lesions or rashes    Lab Results:  CBC    Component Value Date/Time   WBC 5.0 12/01/2021 1111   RBC 4.55 12/01/2021 1111   HGB 13.6 12/01/2021 1111   HCT 40.7 12/01/2021 1111   PLT 198.0 12/01/2021 1111   MCV 89.4 12/01/2021 1111   MCHC 33.5 12/01/2021 1111   RDW 13.6 12/01/2021 1111   LYMPHSABS 1.1 12/01/2021 1111   MONOABS 0.5 12/01/2021 1111   EOSABS 0.3 12/01/2021 1111   BASOSABS 0.1 12/01/2021 1111    BMET    Component Value Date/Time   NA 138 12/01/2021 1111   K 4.2 12/01/2021 1111   CL 103 12/01/2021 1111   CO2 27 12/01/2021 1111   GLUCOSE 86 12/01/2021 1111   BUN 23 12/01/2021 1111   CREATININE 0.89 12/01/2021 1111   CALCIUM 9.2 12/01/2021 1111    BNP No results found for: "BNP"  ProBNP No results found for: "PROBNP"  Imaging: No results found.        No data to display          No results found for: "NITRICOXIDE"      Assessment & Plan:   OSA (obstructive sleep apnea) History of reported sleep apnea.  Sleep study results have been requested.  We have contacted  [employer] for information.  Will need previous sleep study results.  And current BiPAP settings.  Patient is BiPAP [employer] DreamStation is under recall.  Have advised him to register this and advised of the recall information. Order for new [facility]PAP has been [facility] to [facility]. Patient education on sleep apnea, long  discussion regarding use of caution with sedating medications and alcohol.  - discussed how weight can impact sleep and risk for sleep disordered breathing - discussed options to assist with weight loss: combination of diet modification, cardiovascular and strength training exercises   - had an extensive discussion regarding the adverse health consequences related to untreated sleep disordered breathing - specifically discussed the risks for hypertension, coronary artery disease, cardiac dysrhythmias, cerebrovascular disease, and diabetes - lifestyle modification discussed   - discussed how sleep disruption can increase risk of accidents, particularly when driving - safe driving practices were discussed   Plan  Patient Instructions  Request sleep study results.  BIPAP download  Order for new BIPAP .  Order for new supplies.  Your dreamstation BIPAP is under recall, need to go online and start process.  Use caution with sedating medications  Follow up in 3 months and As needed        Restless leg syndrome Difficult to control restless leg symptoms.  Continue to follow-up with neurology.  Medications are being adjusted currently.  We will get BiPAP download to make sure sleep apnea is optimally controlled which can also aggravate restless leg. Discussed use of alcohol with current medications as may be aggravating symptoms as well.  Insomnia Chronic insomnia.  Continue med management with primary care. BiPAP download requested.  Sleep regimen discussed.  Plan  Patient Instructions  Request sleep study results.  BIPAP download  Order for new BIPAP .  Order for new supplies.  Your dreamstation BIPAP is under recall, need to go online and start process.  Use caution with sedating medications  Follow up in 3 months and As needed           Rexene Edison, NP 12/11/2021

## 2021-12-11 NOTE — Addendum Note (Signed)
Addended by: Alvin Critchley on: 12/11/2021 12:47 PM   Modules accepted: Orders

## 2021-12-11 NOTE — Patient Instructions (Signed)
Request sleep study results.  BIPAP download  Order for new BIPAP .  Order for new supplies.  Your dreamstation BIPAP is under recall, need to go online and start process.  Use caution with sedating medications  Follow up in 3 months and As needed

## 2021-12-11 NOTE — Addendum Note (Signed)
Addended by: Alvin Critchley on: 12/11/2021 12:36 PM   Modules accepted: Orders

## 2021-12-11 NOTE — Assessment & Plan Note (Signed)
Chronic insomnia.  Continue med management with primary care. BiPAP download requested.  Sleep regimen discussed.  Plan  Patient Instructions  Request sleep study results.  BIPAP download  Order for new BIPAP .  Order for new supplies.  Your dreamstation BIPAP is under recall, need to go online and start process.  Use caution with sedating medications  Follow up in 3 months and As needed

## 2021-12-11 NOTE — Assessment & Plan Note (Signed)
History of reported sleep apnea.  Sleep study results have been requested.  We have contacted [employer] for information.  Will need previous sleep study results.  And current BiPAP settings.  Patient is BiPAP [employer] DreamStation is under recall.  Have advised him to register this and advised of the recall information. Order for new BiPAP has been sent to [facility]. Patient education on sleep apnea, long discussion regarding use of caution with sedating medications and alcohol.  - discussed how weight can impact sleep and risk for sleep disordered breathing - discussed options to assist with weight loss: combination of diet modification, cardiovascular and strength training exercises   - had an extensive discussion regarding the adverse health consequences related to untreated sleep disordered breathing - specifically discussed the risks for hypertension, coronary artery disease, cardiac dysrhythmias, cerebrovascular disease, and diabetes - lifestyle modification discussed   - discussed how sleep disruption can increase risk of accidents, particularly when driving - safe driving practices were discussed   Plan  Patient Instructions  Request sleep study results.  BIPAP download  Order for new BIPAP .  Order for new supplies.  Your dreamstation BIPAP is under recall, need to go online and start process.  Use caution with sedating medications  Follow up in 3 months and As needed

## 2021-12-14 ENCOUNTER — Telehealth: Payer: Self-pay

## 2021-12-14 ENCOUNTER — Encounter: Payer: Self-pay | Admitting: Internal Medicine

## 2021-12-14 ENCOUNTER — Ambulatory Visit (INDEPENDENT_AMBULATORY_CARE_PROVIDER_SITE_OTHER): Payer: Medicare Other | Admitting: Internal Medicine

## 2021-12-14 DIAGNOSIS — Z79899 Other long term (current) drug therapy: Secondary | ICD-10-CM | POA: Diagnosis not present

## 2021-12-14 DIAGNOSIS — G4700 Insomnia, unspecified: Secondary | ICD-10-CM | POA: Diagnosis not present

## 2021-12-14 MED ORDER — ZOLPIDEM TARTRATE 10 MG PO TABS: 10.0000 mg | ORAL_TABLET | Freq: Every evening | ORAL | 1 refills | Status: DC | PRN

## 2021-12-14 NOTE — Progress Notes (Signed)
Reviewed and agree with assessment/plan.   Chesley Mires, MD [facility] 12/14/2021, 8:49 AM Pager:  [phone]

## 2021-12-14 NOTE — Progress Notes (Signed)
[facility]:  [phone]   Routine Medical Office Visit  Patient:  Jay Fox      Age: 71 y.o.       Sex:  male  Date:   12/14/2021  PCP:    Loralee Pacas, [place] Provider: Loralee Pacas, MD  Assessment/Plan:   Calven was seen today for discuss ambien.  Insomnia, unspecified type Overview: Historically has taken  - Klonopin 0.37m to Klonopin 162m- Ambien and ambien CR worked initially and more recently hasn't worked - No sleep studies - A/w ?restless legs; referred to neurology / sleep who is assisting/consulting on the problem  Assessment & Plan: Chronic poorly controlled, associated with movement disorder Did detailed insomnia assessment today Discussed risks/benefits of continued sedative hypnotics for mgmt ([employer])    Focused on dementia risk, and am car wrecks as an important concern PDMP reviewed during this encounter.  Drug screen requested and contract obtained for taking on / taking over continued controlled substance prescribing 12/14/21 He does have history of prostate cancer so risks of addiction  Orders: -     Zolpidem Tartrate; Take 1 tablet (10 mg total) by mouth at bedtime as needed for sleep.  Dispense: 90 tablet; Refill: 1 -     DRUG MONITORING, PANEL 8 WITH CONFIRMATION, URINE    Return in about 6 months (around 06/15/2022).   BH INSOMNIA ASSESSMENT:  Reported degree of insomnia: severe Insomnia episode began: more than 1 month ago Insomnia episode progress: unchanged Sleep patterns during the week:    Circadian rhythm: evening type    Routine before bed: bath/shower, teeth brushing, changing/removing clothes, computer, meditation/prayer, snack, takes medication and watch TV     Bedroom environment: cold, comfortable and dark     Time to fall asleep: 1 hour   Number of nighttime awakenings: one to two   Difficulty going back to sleep: Yes     Activity when awakened: uses the bathroom      Duration of awakening: 5 minutes   Typical morning wake-up time: 07:00 EDT   Typical time to rise from bed: 08:00 EDT   Premature morning awakening: Yes     Difficult to arouse in the morning: No     Working off-shift hours: No     Sleep aids used: alcohol, hypnotics, prescription drugs and sedatives   Frequency of use: nightly Napping:    Naps during the day: No   Evidence of other sleep disorders:    Obstructive sleep apnea: Yes     Restless leg syndrome: Yes     Disturbing dreams: No     Night terrors: No     Somnambulism: No   Substances that might interfere with sleep:    Caffeine: No     Nicotine: No     Alcohol: Yes     Other substances: No   Other factors that might interfere with sleep:    Irregular sleep schedule: No     Environmental sleep factors: pets    Nocturnal eating: no unhealthy nocturnal eating practices     Exercise timing: not close to bedtime       Common side effects, risks, benefits, and alternatives for medications and treatment plan prescribed today were discussed, and he expressed understanding of the given instructions. In particular I warned about car wrecks and falls and dementia risks  Subjective:   Jay Fox a [age].o. male  with PMH significant for: Past Medical History:  Diagnosis Date   Allergy 1954   Anxiety    Arthritis    Asthma    BPH (benign prostatic hypertrophy)    BPH (benign prostatic hypertrophy)    Cancer (HCC)    Cataract    Chronic kidney disease    Depression    GERD (gastroesophageal reflux disease)    History of kidney cancer    History of prostate cancer 11/30/2021   Hypertension    Leg swelling 11/30/2021   Noticed early 2023 with ferritin deposition.    OSA (obstructive sleep apnea) 11/30/2021   Restless legs 11/30/2021   Seasonal allergies    Sleep apnea      He main concern for today's visit is: Chief Complaint  Patient presents with   Discuss Lorrin Mais      We need to arrange contract to  continue long term [place] which he has been on since 2020 . Dr. Berdine Addison recently advised I continue it but I dont have contract yet. The interventions for his restless legs have yet to bear fruit.       Objective:  Physical Exam: BP (!) 129/54 (BP Location: Right Arm, Patient Position: Sitting)   Pulse 87   Temp 98.4 F (36.9 C) (Temporal)   Resp 14   Ht 5' 9.5" (1.765 m)   Wt 176 lb 3.2 oz (79.9 kg)   SpO2 95%   BMI 25.65 kg/m   He  is a polite, friendly, and genuine person Constitutional: NAD, AAO, not ill-appearing  Neuro: alert, no focal deficit obvious, articulate speech Psych: normal mood, behavior, thought content   Problem specific physical exam findings:  Very pleasant to talk to    Results:  No results found for any visits on 12/14/21.   Recent Results (from the past 2160 hour(s))  Lipid panel     Status: None   Collection Time: 12/01/21 11:11 AM  Result Value Ref Range   Cholesterol 148 0 - 200 mg/dL    Comment: ATP III Classification       Desirable:  < 200 mg/dL               Borderline High:  200 - 239 mg/dL          High:  > = 240 mg/dL   Triglycerides 149.0 0.0 - 149.0 mg/dL    Comment: Normal:  <150 mg/dLBorderline High:  150 - 199 mg/dL   HDL 55.10 >39.00 mg/dL   VLDL 29.8 0.0 - 40.0 mg/dL   LDL Cholesterol 63 0 - 99 mg/dL   Total CHOL/HDL Ratio 3     Comment:                Men          Women1/2 Average Risk     3.4          3.3Average Risk          5.0          4.42X Average Risk          9.6          7.13X Average Risk          15.0          11.0                       NonHDL 92.88     Comment: NOTE:  Non-HDL goal should be 30 mg/dL  higher than patient's LDL goal (i.e. LDL goal of < 70 mg/dL, would have non-HDL goal of < 100 mg/dL)  Comp Met (CMET)     Status: None   Collection Time: 12/01/21 11:11 AM  Result Value Ref Range   Sodium 138 135 - 145 mEq/L   Potassium 4.2 3.5 - 5.1 mEq/L   Chloride 103 96 - 112 mEq/L   CO2 27 19 - 32 mEq/L   Glucose,  Bld 86 70 - 99 mg/dL   BUN 23 6 - 23 mg/dL   Creatinine, Ser 0.89 0.40 - 1.50 mg/dL   Total Bilirubin 0.6 0.2 - 1.2 mg/dL   Alkaline Phosphatase 73 39 - 117 U/L   AST 16 0 - 37 U/L   ALT 21 0 - 53 U/L   Total Protein 6.6 6.0 - 8.3 g/dL   Albumin 4.0 3.5 - 5.2 g/dL   GFR 86.90 >60.00 mL/min    Comment: Calculated using the CKD-EPI Creatinine Equation (2021)   Calcium 9.2 8.4 - 10.5 mg/dL  CBC with Differential/Platelet     Status: Abnormal   Collection Time: 12/01/21 11:11 AM  Result Value Ref Range   WBC 5.0 4.0 - 10.5 K/uL   RBC 4.55 4.22 - 5.81 Mil/uL   Hemoglobin 13.6 13.0 - 17.0 g/dL   HCT 40.7 39.0 - 52.0 %   MCV 89.4 78.0 - 100.0 fl   MCHC 33.5 30.0 - 36.0 g/dL   RDW 13.6 11.5 - 15.5 %   Platelets 198.0 150.0 - 400.0 K/uL   Neutrophils Relative % 59.0 43.0 - 77.0 %   Lymphocytes Relative 22.5 12.0 - 46.0 %   Monocytes Relative 10.7 3.0 - 12.0 %   Eosinophils Relative 6.4 (H) 0.0 - 5.0 %   Basophils Relative 1.4 0.0 - 3.0 %   Neutro Abs 2.9 1.4 - 7.7 K/uL   Lymphs Abs 1.1 0.7 - 4.0 K/uL   Monocytes Absolute 0.5 0.1 - 1.0 K/uL   Eosinophils Absolute 0.3 0.0 - 0.7 K/uL   Basophils Absolute 0.1 0.0 - 0.1 K/uL  Ferritin     Status: None   Collection Time: 12/01/21 11:11 AM  Result Value Ref Range   Ferritin 134.1 22.0 - 322.0 ng/mL  Methylmalonic Acid     Status: None   Collection Time: 12/01/21 11:11 AM  Result Value Ref Range   Methylmalonic Acid, Quant 151 87 - 318 nmol/L    Comment: . This test was developed and its analytical performance characteristics have been determined by [place], [place]. It has not been cleared or approved by the U.S. Food and Drug Administration. This assay has been validated pursuant to the CLIA regulations and is used for clinical purposes. .   HgB A1c     Status: None   Collection Time: 12/01/21 11:11 AM  Result Value Ref Range   Hgb A1c MFr Bld 6.0 4.6 - 6.5 %    Comment: Glycemic Control  Guidelines for People with Diabetes:Non Diabetic:  <6%Goal of Therapy: <7%Additional Action Suggested:  >8%   Iron, Total/Total Iron Binding Cap     Status: None   Collection Time: 12/02/21 10:56 AM  Result Value Ref Range   Iron 94 50 - 180 mcg/dL   TIBC 357 250 - 425 mcg/dL (calc)   %SAT 26 20 - 48 % (calc)  Protein electrophoresis, serum     Status: Abnormal   Collection Time: 12/02/21 10:57 AM  Result Value Ref Range   Total  Protein 6.8 6.1 - 8.1 g/dL   Albumin ELP 4.4 3.8 - 4.8 g/dL   Alpha 1 0.3 0.2 - 0.3 g/dL   Alpha 2 0.7 0.5 - 0.9 g/dL   Beta Globulin 0.4 0.4 - 0.6 g/dL   Beta 2 0.4 0.2 - 0.5 g/dL   Gamma Globulin 0.7 (L) 0.8 - 1.7 g/dL   Abnormal Protein Band1  NONE DETECTED g/dL    Comment: See below   SPE Interp.      Comment: . Consistent with hypogammaglobulinemia. Serum free light  chains or urine immunofixation should be considered if  plasma cell dyscrasias are a possible clinical  diagnosis. .   Immunofixation electrophoresis     Status: None   Collection Time: 12/02/21 10:57 AM  Result Value Ref Range   Immunofix Electr Int      Comment: IgG lambda monoclonal band present.   Immunoglobulin A 140 70 - 320 mg/dL   IgG (Immunoglobin G), Serum 788 600 - 1,540 mg/dL   IgM, Serum 87 50 - 300 mg/dL  Vitamin B12     Status: None   Collection Time: 12/02/21 10:57 AM  Result Value Ref Range   Vitamin B-12 470 211 - 911 pg/mL

## 2021-12-14 NOTE — Telephone Encounter (Signed)
Called pt back and give results per Dr. Berdine Addison. He understood.

## 2021-12-14 NOTE — Assessment & Plan Note (Signed)
Chronic poorly controlled, associated with movement disorder Did detailed insomnia assessment today Discussed risks/benefits of continued sedative hypnotics for mgmt Lorrin Mais, etc)    Focused on dementia risk, and am car wrecks as an important concern PDMP reviewed during this encounter.  Drug screen requested and contract obtained for taking on / taking over continued controlled substance prescribing 12/14/21 He does have history of prostate cancer so risks of addiction

## 2021-12-14 NOTE — Telephone Encounter (Signed)
Pt called for results and I called him back and no answer. Told him to return the call

## 2021-12-15 LAB — DM TEMPLATE

## 2021-12-15 LAB — DRUG MONITORING, PANEL 8 WITH CONFIRMATION, URINE
6 Acetylmorphine: NEGATIVE ng/mL (ref <10)
Alcohol Metabolites: NEGATIVE ng/mL (ref <500)
Amphetamines: NEGATIVE ng/mL (ref <500)
Benzodiazepines: NEGATIVE ng/mL (ref <100)
Buprenorphine, Urine: NEGATIVE ng/mL (ref <5)
Cocaine Metabolite: NEGATIVE ng/mL (ref <150)
Creatinine: 129.6 mg/dL (ref >=20.0)
MDMA: NEGATIVE ng/mL (ref <500)
Marijuana Metabolite: NEGATIVE ng/mL (ref <20)
Opiates: NEGATIVE ng/mL (ref <100)
Oxidant: NEGATIVE ug/mL (ref <200)
Oxycodone: NEGATIVE ng/mL (ref <100)
pH: 5.1 (ref 4.5–9.0)

## 2021-12-15 NOTE — Progress Notes (Signed)
Negative drug screen very good thanks.

## 2021-12-22 ENCOUNTER — Ambulatory Visit: Payer: Medicare Other

## 2021-12-22 ENCOUNTER — Ambulatory Visit (INDEPENDENT_AMBULATORY_CARE_PROVIDER_SITE_OTHER): Payer: Medicare Other

## 2021-12-22 VITALS — Wt 176.0 lb

## 2021-12-22 DIAGNOSIS — Z0000 Encounter for general adult medical examination without abnormal findings: Secondary | ICD-10-CM | POA: Diagnosis not present

## 2021-12-22 NOTE — Patient Instructions (Signed)
[name] , Thank you for taking time to come for your Medicare Wellness Visit. I appreciate your ongoing commitment to your health goals. Please review the following plan we discussed and let me know if I can assist you in the future.   These are the goals we discussed:  Goals      Patient Stated     Exercise more         This is a list of the screening recommended for you and due dates:  Health Maintenance  Topic Date Due   Zoster (Shingles) Vaccine (1 of 2) 07/11/1970   Colon Cancer Screening  Never done   Pneumonia Vaccine (2 - PCV) 07/05/2018   COVID-19 Vaccine (3 - Moderna risk series) 02/16/2021   Flu Shot  12/27/2021*   Tetanus Vaccine  05/13/2027   Hepatitis C Screening: USPSTF Recommendation to screen - Ages 18-79 yo.  Completed   HPV Vaccine  Aged Out  *Topic was postponed. The date shown is not the original due date.    Advanced directives: Please bring a copy of your health care power of attorney and living will to the office at your convenience.  Conditions/risks identified: exercise more   Next appointment: Follow up in one year for your annual wellness visit.   Preventive Care 74 Years and Older, Male  Preventive care refers to lifestyle choices and visits with your health care provider that can promote health and wellness. What does preventive care include? A yearly physical exam. This is also called an annual well check. Dental exams once or twice a year. Routine eye exams. Ask your health care provider how often you should have your eyes checked. Personal lifestyle choices, including: Daily care of your teeth and gums. Regular physical activity. Eating a healthy diet. Avoiding tobacco and drug use. Limiting alcohol use. Practicing safe sex. Taking low doses of aspirin every day. Taking vitamin and mineral supplements as recommended by your health care provider. What happens during an annual well check? The services and screenings done by your  health care provider during your annual well check will depend on your age, overall health, lifestyle risk factors, and family history of disease. Counseling  Your health care provider may ask you questions about your: Alcohol use. Tobacco use. Drug use. Emotional well-being. Home and relationship well-being. Sexual activity. Eating habits. History of falls. Memory and ability to understand (cognition). Work and work Statistician. Screening  You may have the following tests or measurements: Height, weight, and BMI. Blood pressure. Lipid and cholesterol levels. These may be checked every 5 years, or more frequently if you are over 60 years old. Skin check. Lung cancer screening. You may have this screening every year starting at age 80 if you have a 30-pack-year history of smoking and currently smoke or have quit within the past 15 years. Fecal occult blood test (FOBT) of the stool. You may have this test every year starting at age 38. Flexible sigmoidoscopy or colonoscopy. You may have a sigmoidoscopy every 5 years or a colonoscopy every 10 years starting at age 23. Prostate cancer screening. Recommendations will vary depending on your family history and other risks. Hepatitis C blood test. Hepatitis B blood test. Sexually transmitted disease (STD) testing. Diabetes screening. This is done by checking your blood sugar (glucose) after you have not eaten for a while (fasting). You may have this done every 1-3 years. Abdominal aortic aneurysm (AAA) screening. You may need this if you are a current or former  smoker. Osteoporosis. You may be screened starting at age 53 if you are at high risk. Talk with your health care provider about your test results, treatment options, and if necessary, the need for more tests. Vaccines  Your health care provider may recommend certain vaccines, such as: Influenza vaccine. This is recommended every year. Tetanus, diphtheria, and acellular pertussis  (Tdap, Td) vaccine. You may need a Td booster every 10 years. Zoster vaccine. You may need this after age 61. Pneumococcal 13-valent conjugate (PCV13) vaccine. One dose is recommended after age 79. Pneumococcal polysaccharide (PPSV23) vaccine. One dose is recommended after age 69. Talk to your health care provider about which screenings and vaccines you need and how often you need them. This information is not intended to replace advice given to you by your health care provider. Make sure you discuss any questions you have with your health care provider. Document Released: 03/28/2015 Document Revised: 11/19/2015 Document Reviewed: 12/31/2014 Elsevier Interactive Patient Education  2017 [employer] Prevention in the Home Falls can cause injuries. They can happen to people of all ages. There are many things you can do to make your home safe and to help prevent falls. What can I do on the outside of my home? Regularly fix the edges of walkways and driveways and fix any cracks. Remove anything that might make you trip as you walk through a door, such as a raised step or threshold. Trim any bushes or trees on the path to your home. Use bright outdoor lighting. Clear any walking paths of anything that might make someone trip, such as rocks or tools. Regularly check to see if handrails are loose or broken. Make sure that both sides of any steps have handrails. Any raised decks and porches should have guardrails on the edges. Have any leaves, snow, or ice cleared regularly. Use sand or salt on walking paths during winter. Clean up any spills in your garage right away. This includes oil or grease spills. What can I do in the bathroom? Use night lights. Install grab bars by the toilet and in the tub and shower. Do not use towel bars as grab bars. Use non-skid mats or decals in the tub or shower. If you need to sit down in the shower, use a plastic, non-slip stool. Keep the floor dry. Clean  up any water that spills on the floor as soon as it happens. Remove soap buildup in the tub or shower regularly. Attach bath mats securely with double-sided non-slip rug tape. Do not have throw rugs and other things on the floor that can make you trip. What can I do in the bedroom? Use night lights. Make sure that you have a light by your bed that is easy to reach. Do not use any sheets or blankets that are too big for your bed. They should not hang down onto the floor. Have a firm chair that has side arms. You can use this for support while you get dressed. Do not have throw rugs and other things on the floor that can make you trip. What can I do in the kitchen? Clean up any spills right away. Avoid walking on wet floors. Keep items that you use a lot in easy-to-reach places. If you need to reach something above you, use a strong step stool that has a grab bar. Keep electrical cords out of the way. Do not use floor polish or wax that makes floors slippery. If you must use wax, use non-skid  floor wax. Do not have throw rugs and other things on the floor that can make you trip. What can I do with my stairs? Do not leave any items on the stairs. Make sure that there are handrails on both sides of the stairs and use them. Fix handrails that are broken or loose. Make sure that handrails are as long as the stairways. Check any carpeting to make sure that it is firmly attached to the stairs. Fix any carpet that is loose or worn. Avoid having throw rugs at the top or bottom of the stairs. If you do have throw rugs, attach them to the floor with carpet tape. Make sure that you have a light switch at the top of the stairs and the bottom of the stairs. If you do not have them, ask someone to add them for you. What else can I do to help prevent falls? Wear shoes that: Do not have high heels. Have rubber bottoms. Are comfortable and fit you well. Are closed at the toe. Do not wear sandals. If you  use a stepladder: Make sure that it is fully opened. Do not climb a closed stepladder. Make sure that both sides of the stepladder are locked into place. Ask someone to hold it for you, if possible. Clearly mark and make sure that you can see: Any grab bars or handrails. First and last steps. Where the edge of each step is. Use tools that help you move around (mobility aids) if they are needed. These include: Canes. Walkers. Scooters. Crutches. Turn on the lights when you go into a dark area. Replace any light bulbs as soon as they burn out. Set up your furniture so you have a clear path. Avoid moving your furniture around. If any of your floors are uneven, fix them. If there are any pets around you, be aware of where they are. Review your medicines with your doctor. Some medicines can make you feel dizzy. This can increase your chance of falling. Ask your doctor what other things that you can do to help prevent falls. This information is not intended to replace advice given to you by your health care provider. Make sure you discuss any questions you have with your health care provider. Document Released: 12/26/2008 Document Revised: 08/07/2015 Document Reviewed: 04/05/2014 Elsevier Interactive Patient Education  2017 [employer].

## 2021-12-22 NOTE — Progress Notes (Addendum)
I connected with  [name] on 12/22/21 by a audio enabled telemedicine application and verified that I am speaking with the correct person using two identifiers.  Patient Location: Home  Provider Location: Office/Clinic  I discussed the limitations of evaluation and management by telemedicine. The patient expressed understanding and agreed to proceed.   Subjective:   Jay Fox is a 71 y.o. male who presents for Medicare Annual/Subsequent preventive examination.  Review of Systems     Cardiac Risk Factors include: advanced age (>67mn, >>39women);hypertension;male gender     Objective:    Today's Vitals   12/22/21 0800  Weight: 176 lb (79.8 kg)   Body mass index is 25.62 kg/m.     12/22/2021    8:05 AM 12/02/2021    9:25 AM 06/29/2021   11:04 AM  Advanced Directives  Does Patient Have a Medical Advance Directive? Yes Yes Yes  Type of [place]amed[place]of [place] will Out of facility DNR (pink MOST or yellow form);HCesar ChavezLiving will [place]Living will;Out of facility DNR (pink MOST or yellow form)  Does patient want to make changes to medical advance directive? No - Patient declined    Copy of [place] Chart? Yes - validated most recent copy scanned in chart (See row information)      Current Medications (verified) Outpatient Encounter Medications as of 12/22/2021  Medication Sig   albuterol (PROVENTIL) (2.5 MG/3ML) 0.083% nebulizer solution Take 3 mLs (2.5 mg total) by nebulization every 6 (six) hours as needed for wheezing or shortness of breath.   albuterol (VENTOLIN HFA) 108 (90 Base) MCG/ACT inhaler Inhale 1-2 puffs into the lungs every 6 (six) hours as needed.   amLODipine (NORVASC) 5 MG tablet Take 2 tablets by mouth once daily   Ascorbic Acid (VITAMIN C PO) Take 1-2 tablets by mouth 2 (two) times daily. 5060m  budesonide-formoterol (SYMBICORT) 160-4.5 MCG/ACT  inhaler Inhale 2 puffs into the lungs 2 (two) times daily.   calcium carbonate (TUMS - DOSED IN MG ELEMENTAL CALCIUM) 500 MG chewable tablet Chew 1 tablet by mouth daily.   Calcium Carbonate-Vit D-Min (CALCIUM 1200 PO) Take 1 tablet by mouth daily.   Cholecalciferol (VITAMIN D PO) Take 1 tablet by mouth daily.   Cholecalciferol (VITAMIN D3) 50 MCG (2000 UT) TABS Take 1 tablet by mouth daily.   diclofenac Sodium (VOLTAREN) 1 % GEL Place onto the skin.   fluticasone (FLONASE) 50 MCG/ACT nasal spray Place into the nose.   irbesartan (AVAPRO) 300 MG tablet Take 1 tablet (300 mg total) by mouth daily.   Multiple Vitamin (MULTIVITAMIN) tablet Take 1 tablet by mouth daily.   Omega-3 Fatty Acids (FISH OIL) 1200 MG CAPS Take 1,200 mg by mouth in the morning and at bedtime.   pregabalin (LYRICA) 50 MG capsule Take 1 capsule (50 mg total) by mouth at bedtime.   Respiratory Therapy Supplies MISC Filters Hoses tubing masks headgear cleaning supplies for dreamwear and  philips respironics bipap machine.   ropinirole (REQUIP) 5 MG tablet Take 1 tablet (5 mg total) by mouth at bedtime.   sildenafil (REVATIO) 20 MG tablet    Spacer/Aero-Holding Chambers (AEROCHAMBER MV) inhaler Use as directed with albuterol inhaler.   TURMERIC PO Take 1,000 mg by mouth in the morning and at bedtime.   vitamin B-12 (CYANOCOBALAMIN) 500 MCG tablet Take 1 tablet by mouth daily.   zolpidem (AMBIEN) 10 MG tablet Take 1 tablet (10 mg total) by  mouth at bedtime as needed for sleep.   [DISCONTINUED] doxycycline (VIBRA-TABS) 100 MG tablet Take 1 tablet (100 mg total) by mouth 2 (two) times daily. (Patient not taking: Reported on 12/02/2021)   No facility-administered encounter medications on file as of 12/22/2021.    Allergies (verified) Shellfish allergy   History: Past Medical History:  Diagnosis Date   Allergy 1953   Anxiety    Arthritis    Asthma    BPH (benign prostatic hypertrophy)    BPH (benign prostatic hypertrophy)     Cancer (HCC)    Cataract    Chronic kidney disease    Depression    GERD (gastroesophageal reflux disease)    History of kidney cancer    History of prostate cancer 11/30/2021   Hypertension    Leg swelling 11/30/2021   Noticed early 2023 with ferritin deposition.    OSA (obstructive sleep apnea) 11/30/2021   Restless legs 11/30/2021   Seasonal allergies    Sleep apnea    Past Surgical History:  Procedure Laterality Date   NASAL SINUS SURGERY  2019   RENAL CRYOABLATION     TONSILLECTOMY     Family History  Problem Relation Age of Onset   Emphysema Mother    Liver cancer Mother    Pancreatic cancer Mother    Anxiety disorder Mother    Cancer Mother    Hypertension Mother    Multiple myeloma Father    Cancer Father    Hypertension Father    Colon cancer Paternal Grandmother    Colon cancer Paternal Grandfather    Social History   Socioeconomic History   Marital status: Married    Spouse name: Not on file   Number of children: 3   Years of education: 12   Highest education level: 12th grade  Occupational History   Occupation: Retired  Tobacco Use   Smoking status: Never   Smokeless tobacco: Never  Scientific laboratory technician Use: Never used  Substance and Sexual Activity   Alcohol use: Not Currently    Alcohol/week: 10.0 standard drinks of alcohol    Types: 10 Standard drinks or equivalent per week    Comment: Varies sometimes not at all   Drug use: No   Sexual activity: Not Currently  Other Topics Concern   Not on file  Social History Narrative   Right handed   Caffeine none   Lives in one story home   Social Determinants of Health   Financial Resource Strain: Low Risk  (12/22/2021)   Overall Financial Resource Strain (CARDIA)    Difficulty of Paying Living Expenses: Not hard at all  Food Insecurity: No Food Insecurity (12/22/2021)   Hunger Vital Sign    Worried About Running Out of Food in the Last Year: Never true    Ran Out of Food in the Last Year:  Never true  Transportation Needs: No Transportation Needs (12/22/2021)   PRAPARE - Hydrologist (Medical): No    Lack of Transportation (Non-Medical): No  Physical Activity: Insufficiently Active (12/22/2021)   Exercise Vital Sign    Days of Exercise per Week: 2 days    Minutes of Exercise per Session: 30 min  Stress: No Stress Concern Present (12/22/2021)   [place]    Feeling of Stress : Not at all  Social Connections: Moderately Integrated (12/22/2021)   Social Connection and Isolation Panel [NHANES]    Frequency of  Communication with Friends and Family: Twice a week    Frequency of Social Gatherings with Friends and Family: More than three times a week    Attends Religious Services: More than 4 times per year    Active Member of [employer] or Organizations: No    Attends Music therapist: Never    Marital Status: Married    Tobacco Counseling Counseling given: Not Answered   Clinical Intake:  Pre-visit preparation completed: Yes  Pain : No/denies pain     BMI - recorded: 25.62 Nutritional Status: BMI 25 -29 Overweight Nutritional Risks: None Diabetes: No  How often do you need to have someone help you when you read instructions, pamphlets, or other written materials from your doctor or pharmacy?: 1 - Never  Diabetic?no  Interpreter Needed?: No  Information entered by :: Charlott Rakes, LPN   Activities of Daily Living    12/22/2021    8:06 AM  In your present state of health, do you have any difficulty performing the following activities:  Hearing? 0  Vision? 0  Difficulty concentrating or making decisions? 0  Walking or climbing stairs? 0  Dressing or bathing? 0  Doing errands, shopping? 0  Preparing Food and eating ? N  Using the Toilet? N  In the past six months, have you accidently leaked urine? Y  Comment wears a pad  Do you have problems with  loss of bowel control? N  Managing your Medications? N  Managing your Finances? N  Housekeeping or managing your Housekeeping? N    Patient Care Team: Loralee Pacas, MD as PCP - General (Internal Medicine) Elsie Stain, MD as Attending Physician (Pulmonary Disease)  Indicate any recent Medical Services you may have received from other than [facility] providers in the past year (date may be approximate).     Assessment:   This is a routine wellness examination for Emersen.  Hearing/Vision screen Hearing Screening - Comments:: Pt denies any hearing issues  Vision Screening - Comments:: Pt follows up with Dr Katy Fitch for annual eye exams   Dietary issues and exercise activities discussed: Current Exercise Habits: Home exercise routine, Type of exercise: stretching;Other - see comments (stationary bike), Time (Minutes): 20, Frequency (Times/Week): 2, Weekly Exercise (Minutes/Week): 40   Goals Addressed             This Visit's Progress    Patient Stated       Exercise more        Depression Screen    12/22/2021    8:03 AM 11/30/2021    3:07 PM 06/29/2021   11:09 AM 05/14/2021    9:52 AM  PHQ 2/9 Scores  PHQ - 2 Score 0 0 0 0  PHQ- 9 Score   2 0    Fall Risk    12/22/2021    8:06 AM 12/02/2021    9:24 AM 11/30/2021    3:06 PM 06/29/2021   11:07 AM 05/14/2021    9:52 AM  Fall Risk   Falls in the past year? 0 0 0 0 0  Number falls in past yr: 0 0 0 0 0  Injury with Fall? 0 0 0 0 0  Risk for fall due to : Impaired vision  No Fall Risks History of fall(s) No Fall Risks  Follow up Falls prevention discussed  Falls evaluation completed Falls prevention discussed;Education provided Falls evaluation completed    FALL RISK PREVENTION PERTAINING TO THE HOME:  Any stairs in or around the  home? No  If so, are there any without handrails? No  Home free of loose throw rugs in walkways, pet beds, electrical cords, etc? Yes  Adequate lighting in your home to reduce risk of falls?  Yes   ASSISTIVE DEVICES UTILIZED TO PREVENT FALLS:  Life alert? No  Use of a cane, walker or w/c? No  Grab bars in the bathroom? Yes  Shower chair or bench in shower? No  Elevated toilet seat or a handicapped toilet? No   TIMED UP AND GO:  Was the test performed? No .    Cognitive Function:        12/22/2021    8:07 AM  6CIT Screen  What Year? 0 points  What month? 0 points  What time? 0 points  Count back from 20 0 points  Months in reverse 0 points  Repeat phrase 0 points  Total Score 0 points    Immunizations Immunization History  Administered Date(s) Administered   Fluad Quad(high Dose 65+) 11/21/2019   Influenza Whole 12/11/2010   Influenza,trivalent, recombinat, inj, PF 12/11/2010, 12/28/2011, 12/15/2012   Moderna Covid-19 Vaccine Bivalent Booster 35yr & up 05/15/2019, 06/12/2019   Moderna SARS-COV2 Booster Vaccination 01/16/2020, 07/10/2020, 01/19/2021   Pneumococcal Polysaccharide-23 12/28/2011   Pneumococcal-Unspecified 07/04/2017   Tdap 07/05/2014, 05/12/2017   Zoster, Unspecified 05/15/2017, 08/13/2017    TDAP status: Up to date  Flu Vaccine status: Up to date per pt will input date   Pneumococcal vaccine status: Due, Education has been provided regarding the importance of this vaccine. Advised may receive this vaccine at local pharmacy or Health Dept. Aware to provide a copy of the vaccination record if obtained from local pharmacy or Health Dept. Verbalized acceptance and understanding.  Covid-19 vaccine status: Completed vaccines  Qualifies for Shingles Vaccine? Yes   Zostavax completed Yes   Shingrix Completed?: No.    Education has been provided regarding the importance of this vaccine. Patient has been advised to call insurance company to determine out of pocket expense if they have not yet received this vaccine. Advised may also receive vaccine at local pharmacy or Health Dept. Verbalized acceptance and understanding.  Screening  Tests Health Maintenance  Topic Date Due   Zoster Vaccines- Shingrix (1 of 2) 07/11/1970   COLONOSCOPY (Pts 45-447yrInsurance coverage will need to be confirmed)  Never done   Pneumonia Vaccine 6575Years old (2 - PCV) 07/05/2018   COVID-19 Vaccine (3 - Moderna risk series) 02/16/2021   INFLUENZA VACCINE  12/27/2021 (Originally 10/13/2021)   TETANUS/TDAP  05/13/2027   Hepatitis C Screening  Completed   HPV VACCINES  Aged Out    Health Maintenance  Health Maintenance Due  Topic Date Due   Zoster Vaccines- Shingrix (1 of 2) 07/11/1970   COLONOSCOPY (Pts 45-4985yrnsurance coverage will need to be confirmed)  Never done   Pneumonia Vaccine 65+64ears old (2 - PCV) 07/05/2018   COVID-19 Vaccine (3 - Moderna risk series) 02/16/2021    Colonoscopy pt stated completed 2021  Additional Screening:  Hepatitis C Screening:  Completed 05/14/21  Vision Screening: Recommended annual ophthalmology exams for early detection of glaucoma and other disorders of the eye. Is the patient up to date with their annual eye exam?  Yes  Who is the provider or what is the name of the office in which the patient attends annual eye exams? Dr GroKaty Fitchf pt is not established with a provider, would they like to be referred to a provider to establish  care? No .   Dental Screening: Recommended annual dental exams for proper oral hygiene  Community Resource Referral / Chronic Care Management: CRR required this visit?  No   CCM required this visit?  No      Plan:     I have personally reviewed and noted the following in the patient's chart:   Medical and social history Use of alcohol, tobacco or illicit drugs  Current medications and supplements including opioid prescriptions. Patient is not currently taking opioid prescriptions. Functional ability and status Nutritional status Physical activity Advanced directives List of other physicians Hospitalizations, surgeries, and ER visits in previous 12  months Vitals Screenings to include cognitive, depression, and falls Referrals and appointments  In addition, I have reviewed and discussed with patient certain preventive protocols, quality metrics, and best practice recommendations. A written personalized care plan for preventive services as well as general preventive health recommendations were provided to patient.     Willette Brace, LPN   [id number]   Nurse Notes: none

## 2021-12-23 ENCOUNTER — Other Ambulatory Visit: Payer: Self-pay

## 2021-12-28 ENCOUNTER — Other Ambulatory Visit: Payer: Self-pay

## 2021-12-28 ENCOUNTER — Other Ambulatory Visit: Payer: Self-pay | Admitting: *Deleted

## 2021-12-28 DIAGNOSIS — M7989 Other specified soft tissue disorders: Secondary | ICD-10-CM

## 2022-01-04 ENCOUNTER — Ambulatory Visit (HOSPITAL_COMMUNITY)
Admission: RE | Admit: 2022-01-04 | Discharge: 2022-01-04 | Disposition: A | Discharge status: Home / Self Care | Payer: Medicare Other | Source: Ambulatory Visit | Attending: Surgery | Admitting: Surgery

## 2022-01-04 ENCOUNTER — Ambulatory Visit: Payer: Medicare Other | Admitting: Physician Assistant

## 2022-01-04 VITALS — BP 118/66 | HR 65 | Temp 98.0°F | Resp 16 | Ht 69.5 in | Wt 177.0 lb

## 2022-01-04 DIAGNOSIS — M7989 Other specified soft tissue disorders: Secondary | ICD-10-CM | POA: Insufficient documentation

## 2022-01-04 DIAGNOSIS — I872 Venous insufficiency (chronic) (peripheral): Secondary | ICD-10-CM | POA: Diagnosis not present

## 2022-01-04 NOTE — Progress Notes (Signed)
Requested by:  Loralee Pacas, [place] [address] [address],  [place] [address]  Reason for consultation: hemosiderin staining of lower extremities    History of Present Illness   Jay Fox is a 71 y.o. ([birth year]) male who presents for evaluation of ferritin deposition bilaterally.  He was referred to [place] by his PCP for noticeable hemosiderin staining around his ankles bilaterally.  This was first noticed earlier this year.  He denies noticing the staining before his primary care provider mentioned it.  He denies any leg swelling, ulcer of the lower extremities, previous vein procedures, or history of DVT.  He has been dealing with worsening restless leg syndrome.  This is being investigated by his PCP and neurologist.  He is currently on ropinirole for it.  He denies any claudication, rest pain, wounds of the lower extremities.  Past Medical History:  Diagnosis Date   Allergy 1953   Anxiety    Arthritis    Asthma    BPH (benign prostatic hypertrophy)    BPH (benign prostatic hypertrophy)    Cancer (HCC)    Cataract    Chronic kidney disease    Depression    GERD (gastroesophageal reflux disease)    History of kidney cancer    History of prostate cancer 11/30/2021   Hypertension    Leg swelling 11/30/2021   Noticed early 2023 with ferritin deposition.    OSA (obstructive sleep apnea) 11/30/2021   Restless legs 11/30/2021   Seasonal allergies    Sleep apnea     Past Surgical History:  Procedure Laterality Date   NASAL SINUS SURGERY  2019   RENAL CRYOABLATION     TONSILLECTOMY      Social History   Socioeconomic History   Marital status: Married    Spouse name: Not on file   Number of children: 3   Years of education: 12   Highest education level: 12th grade  Occupational History   Occupation: Retired  Tobacco Use   Smoking status: Never   Smokeless tobacco: Never  Scientific laboratory technician Use: Never used  Substance and Sexual Activity   Alcohol use: Not  Currently    Alcohol/week: 10.0 standard drinks of alcohol    Types: 10 Standard drinks or equivalent per week    Comment: Varies sometimes not at all   Drug use: No   Sexual activity: Not Currently  Other Topics Concern   Not on file  Social History Narrative   Right handed   Caffeine none   Lives in one story home   Social Determinants of Health   Financial Resource Strain: Low Risk  (12/22/2021)   Overall Financial Resource Strain (CARDIA)    Difficulty of Paying Living Expenses: Not hard at all  Food Insecurity: No Food Insecurity (12/22/2021)   Hunger Vital Sign    Worried About Running Out of Food in the Last Year: Never true    Ran Out of Food in the Last Year: Never true  Transportation Needs: No Transportation Needs (12/22/2021)   PRAPARE - Hydrologist (Medical): No    Lack of Transportation (Non-Medical): No  Physical Activity: Insufficiently Active (12/22/2021)   Exercise Vital Sign    Days of Exercise per Week: 2 days    Minutes of Exercise per Session: 30 min  Stress: No Stress Concern Present (12/22/2021)   [place]    Feeling of Stress :  Not at all  Social Connections: Moderately Integrated (12/22/2021)   Social Connection and Isolation Panel [NHANES]    Frequency of Communication with Friends and Family: Twice a week    Frequency of Social Gatherings with Friends and Family: More than three times a week    Attends Religious Services: More than 4 times per year    Active Member of [employer] or Organizations: No    Attends Archivist Meetings: Never    Marital Status: Married  Human resources officer Violence: Not At Risk (12/22/2021)   Humiliation, Afraid, Rape, and Kick questionnaire    Fear of Current or Ex-Partner: No    Emotionally Abused: No    Physically Abused: No    Sexually Abused: No    Family History  Problem Relation Age of Onset   Emphysema  Mother    Liver cancer Mother    Pancreatic cancer Mother    Anxiety disorder Mother    Cancer Mother    Hypertension Mother    Multiple myeloma Father    Cancer Father    Hypertension Father    Colon cancer Paternal Grandmother    Colon cancer Paternal Grandfather     Current Outpatient Medications  Medication Sig Dispense Refill   albuterol (PROVENTIL) (2.5 MG/3ML) 0.083% nebulizer solution Take 3 mLs (2.5 mg total) by nebulization every 6 (six) hours as needed for wheezing or shortness of breath. 150 mL 1   albuterol (VENTOLIN HFA) 108 (90 Base) MCG/ACT inhaler Inhale 1-2 puffs into the lungs every 6 (six) hours as needed. 18 g 11   amLODipine (NORVASC) 5 MG tablet Take 2 tablets by mouth once daily 90 tablet 0   Ascorbic Acid (VITAMIN C PO) Take 1-2 tablets by mouth 2 (two) times daily. 568m     budesonide-formoterol (SYMBICORT) 160-4.5 MCG/ACT inhaler Inhale 2 puffs into the lungs 2 (two) times daily. 1 each 12   calcium carbonate (TUMS - DOSED IN MG ELEMENTAL CALCIUM) 500 MG chewable tablet Chew 1 tablet by mouth daily.     Calcium Carbonate-Vit D-Min (CALCIUM 1200 PO) Take 1 tablet by mouth daily.     Cholecalciferol (VITAMIN D PO) Take 1 tablet by mouth daily.     Cholecalciferol (VITAMIN D3) 50 MCG (2000 UT) TABS Take 1 tablet by mouth daily.     diclofenac Sodium (VOLTAREN) 1 % GEL Place onto the skin.     fluticasone (FLONASE) 50 MCG/ACT nasal spray Place into the nose.     irbesartan (AVAPRO) 300 MG tablet Take 1 tablet (300 mg total) by mouth daily. 90 tablet 1   Multiple Vitamin (MULTIVITAMIN) tablet Take 1 tablet by mouth daily.     Omega-3 Fatty Acids (FISH OIL) 1200 MG CAPS Take 1,200 mg by mouth in the morning and at bedtime.     pregabalin (LYRICA) 50 MG capsule Take 1 capsule (50 mg total) by mouth at bedtime. 30 capsule 2   Respiratory Therapy Supplies MISC Filters Hoses tubing masks headgear cleaning supplies for dreamwear and  philips respironics bipap machine. 2  each 0   ropinirole (REQUIP) 5 MG tablet Take 1 tablet (5 mg total) by mouth at bedtime. 90 tablet 1   sildenafil (REVATIO) 20 MG tablet      Spacer/Aero-Holding Chambers (AEROCHAMBER MV) inhaler Use as directed with albuterol inhaler. 1 each 0   TURMERIC PO Take 1,000 mg by mouth in the morning and at bedtime.     vitamin B-12 (CYANOCOBALAMIN) 500 MCG tablet Take 1 tablet  by mouth daily.     zolpidem (AMBIEN) 10 MG tablet Take 1 tablet (10 mg total) by mouth at bedtime as needed for sleep. 90 tablet 1   No current facility-administered medications for this visit.    Allergies  Allergen Reactions   Shellfish Allergy Rash, Shortness Of Breath and Swelling    REVIEW OF SYSTEMS (negative unless checked):   Cardiac:  []  Chest pain or chest pressure? []  Shortness of breath upon activity? []  Shortness of breath when lying flat? []  Irregular heart rhythm?  Vascular:  []  Pain in calf, thigh, or hip brought on by walking? []  Pain in feet at night that wakes you up from your sleep? []  Blood clot in your veins? []  Leg swelling?  Pulmonary:  []  Oxygen at home? []  Productive cough? []  Wheezing?  Neurologic:  []  Sudden weakness in arms or legs? []  Sudden numbness in arms or legs? []  Sudden onset of difficult speaking or slurred speech? []  Temporary loss of vision in one eye? []  Problems with dizziness?  Gastrointestinal:  []  Blood in stool? []  Vomited blood?  Genitourinary:  []  Burning when urinating? []  Blood in urine?  Psychiatric:  []  Major depression  Hematologic:  []  Bleeding problems? []  Problems with blood clotting?  Dermatologic:  []  Rashes or ulcers?  Constitutional:  []  Fever or chills?  Ear/Nose/Throat:  []  Change in hearing? []  Nose bleeds? []  Sore throat?  Musculoskeletal:  []  Back pain? []  Joint pain? []  Muscle pain?   Physical Examination     Vitals:   01/04/22 1325  BP: 118/66  Pulse: 65  Resp: 16  Temp: 98 F (36.7 C)  TempSrc:  Temporal  SpO2: 98%  Weight: 177 lb (80.3 kg)  Height: 5' 9.5" (1.765 m)   Body mass index is 25.76 kg/m.  General:  WDWN in NAD; vital signs documented above Gait: Not observed HENT: WNL, normocephalic Pulmonary: normal non-labored breathing  Cardiac: regular HR, without murmurs without carotid bruit Abdomen: soft, NT, no masses Skin: without rashes Vascular Exam/Pulses: 2+ DP and PT pulses bilaterally Extremities: without varicose veins, without reticular veins, without edema, with mild stasis pigmentation around bilateral ankles, without lipodermatosclerosis, without ulcers Musculoskeletal: no muscle wasting or atrophy  Neurologic: A&O X 3;  No focal weakness or paresthesias are detected Psychiatric:  The pt has Normal affect.  Non-invasive Vascular Imaging   LLE Venous Insufficiency Duplex (01/04/2022):   -+  LEFT          Reflux NoRefluxReflux TimeDiameter cmsComments                                     Yes                                              +--------------+---------+------+-----------+------------+-----------------  -+  CFV           no                                                         +--------------+---------+------+-----------+------------+-----------------  -+  FV prox  yes   >1 second                                  +--------------+---------+------+-----------+------------+-----------------  -+  FV mid        no                                                         +--------------+---------+------+-----------+------------+-----------------  -+  FV dist       no                                                         +--------------+---------+------+-----------+------------+-----------------  -+  Popliteal               yes   >1 second                                  +--------------+---------+------+-----------+------------+-----------------  -+  GSV at SFJ    no                            0.586                         +--------------+---------+------+-----------+------------+-----------------  -+  GSV prox thighno                           0.283                         +--------------+---------+------+-----------+------------+-----------------  -+  GSV mid thigh no                           0.288                         +--------------+---------+------+-----------+------------+-----------------  -+  GSV dist thigh                             0.205                         +--------------+---------+------+-----------+------------+-----------------  -+  GSV at knee             yes    >500 ms     0.342                         +--------------+---------+------+-----------+------------+-----------------  -+  GSV prox calf no                           0.325    doppler image  lost  +--------------+---------+------+-----------+------------+-----------------  -+  SSV Pop Fossa no  0.698                         +--------------+---------+------+-----------+------------+-----------------  -+  SSV prox calf no                           0.274                         +--------------+---------+------+-----------+------------+-----------------  -+  SSV mid calf  no                           0.284                         +--------------+---------+------+-----------+------------+-----------------  -+    Medical Decision Making   Jay Fox is a 71 y.o. male who presents for evaluation of hemosiderin staining of bilateral ankles  Based on the patient's reflux study, he has venous reflux in his left deep system at the proximal femoral vein and popliteal vein.  He also has reflux in his left greater saphenous vein at the knee. He has some mild hemosiderin staining around his bilateral ankles.  This is indicative of his mild venous reflux.  He is not been experiencing any  lower extremity swelling or wounds of the lower extremities.  I believe he would benefit from using mild knee-high compression stockings as needed.  I have given him a tip sheet to keep his veins healthy I do not think his venous reflux is contributing to the restless leg so him symptoms he is having.  I do not think arterial disease is an issue either, he has no claudication or rest pain.  He also has palpable peripheral pulses He can follow-up with [place] as needed  Gerri Lins, PA-C [facility]: [phone]  01/04/2022, 1:45 PM  Clinic MD: Trula Slade

## 2022-01-05 ENCOUNTER — Other Ambulatory Visit: Payer: Self-pay

## 2022-01-07 ENCOUNTER — Telehealth: Payer: Self-pay | Admitting: Internal Medicine

## 2022-01-07 NOTE — Telephone Encounter (Signed)
Patient transferred PCP from [facility] to Dr. Randol Kern and states he has been unable to get his RX refills that were prescribed by Dr. Orland Mustard.  Patient requests any RX's that were previously by Dr. Orland Mustard be sent by Dr. Randol Kern.  Patient requests new RX's for the following be sent asap:   amLODipine (NORVASC) 5 MG tablet   AND  irbesartan (AVAPRO) 300 MG tablet   Be sent to    [place], [place] - [address] Phone: [phone]  Fax: [phone]      Patient states the following medications need to be switched to Dr. Randol Kern for future needs:  budesonide-formoterol (SYMBICORT) 160-4.5 MCG/ACT inhaler    albuterol (VENTOLIN HFA) 108 (90 Base) MCG/ACT inhaler    ropinirole (REQUIP) 5 MG tablet

## 2022-01-11 ENCOUNTER — Encounter: Payer: Self-pay | Admitting: Neurology

## 2022-01-11 NOTE — Telephone Encounter (Signed)
Pt is asking for an update on his medications. He is out & has not received a response. Please advise

## 2022-01-12 ENCOUNTER — Other Ambulatory Visit: Payer: Self-pay | Admitting: Internal Medicine

## 2022-01-12 ENCOUNTER — Other Ambulatory Visit: Payer: Self-pay

## 2022-01-12 DIAGNOSIS — I10 Essential (primary) hypertension: Secondary | ICD-10-CM

## 2022-01-12 MED ORDER — ROPINIROLE HCL 5 MG PO TABS: 5.0000 mg | ORAL_TABLET | Freq: Every day | ORAL | 1 refills | Status: DC

## 2022-01-12 MED ORDER — ALBUTEROL SULFATE (2.5 MG/3ML) 0.083% IN NEBU: 2.5000 mg | INHALATION_SOLUTION | Freq: Four times a day (QID) | RESPIRATORY_TRACT | 1 refills | Status: DC | PRN

## 2022-01-12 MED ORDER — AMLODIPINE BESYLATE 5 MG PO TABS: 10.0000 mg | ORAL_TABLET | Freq: Every day | ORAL | 0 refills | Status: DC

## 2022-01-12 MED ORDER — ALBUTEROL SULFATE HFA 108 (90 BASE) MCG/ACT IN AERS: 2.0000 | INHALATION_SPRAY | Freq: Four times a day (QID) | RESPIRATORY_TRACT | 11 refills | Status: AC | PRN

## 2022-01-12 MED ORDER — IRBESARTAN 300 MG PO TABS: 300.0000 mg | ORAL_TABLET | Freq: Every day | ORAL | 1 refills | Status: DC

## 2022-01-12 NOTE — Telephone Encounter (Signed)
Dr. Randol Kern sent in new prescriptions for the amlodipine and irbesartan. I sent in new prescriptions for the symbicort inhaler, albuterol inhaler and the ropinirole under Dr. Dennard Nip name.

## 2022-01-12 NOTE — Progress Notes (Unsigned)
Rf meds per request

## 2022-01-19 ENCOUNTER — Other Ambulatory Visit: Payer: Self-pay | Admitting: Neurology

## 2022-01-19 DIAGNOSIS — G2581 Restless legs syndrome: Secondary | ICD-10-CM

## 2022-01-19 DIAGNOSIS — R209 Unspecified disturbances of skin sensation: Secondary | ICD-10-CM

## 2022-01-19 MED ORDER — PREGABALIN 50 MG PO CAPS: 100.0000 mg | ORAL_CAPSULE | Freq: Every day | ORAL | 5 refills | Status: DC

## 2022-01-20 ENCOUNTER — Encounter: Payer: Self-pay | Admitting: Neurology

## 2022-01-25 ENCOUNTER — Ambulatory Visit: Payer: Medicare Other | Admitting: Neurology

## 2022-01-25 DIAGNOSIS — G2581 Restless legs syndrome: Secondary | ICD-10-CM | POA: Diagnosis not present

## 2022-01-25 DIAGNOSIS — G8929 Other chronic pain: Secondary | ICD-10-CM

## 2022-01-25 DIAGNOSIS — R209 Unspecified disturbances of skin sensation: Secondary | ICD-10-CM

## 2022-01-25 DIAGNOSIS — R292 Abnormal reflex: Secondary | ICD-10-CM

## 2022-01-25 DIAGNOSIS — M5417 Radiculopathy, lumbosacral region: Secondary | ICD-10-CM

## 2022-01-25 NOTE — Procedures (Signed)
  [facility]  [address]  [place], [place] [address] Tel: [phone] Fax: [phone] Test Date:  01/25/2022  Patient: Jay Fox DOB: 05/03/1950 Physician: Kai Levins, MD  Sex: Male Height: 5' 9.5" Ref Phys: Kai Levins, MD  ID#: [id number]   Technician:    History: This is a 71 year old male with worsening restless legs.  NCV & EMG Findings: Extensive electrodiagnostic evaluation of the left lower limb shows: Left sural and superficial peroneal sensory studies are within normal limits. Left peroneal (EDB) and tibial (AH) motor studies are within normal limits. Left H reflex response shows a mildly prolonged latency. Chronic motor axon loss changes without accompanying active denervation changes are seen in the left tibialis anterior, flexor digitorum longus, medial head of gastrocnemius, and short head of bicep femoris muscles.  Impression: This is an abnormal electrodiagnostic evaluation. 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 No electrodiagnostic evidence of a generalized polyneuropathy or myopathy.  ___________________________ Kai Levins, MD    Nerve Conduction Studies   Stim Site NR Peak (ms) Norm Peak (ms) O-P Amp (V) Norm O-P Amp  Left Sup Peroneal Anti Sensory (Ant Lat Mall)  32.4 C  12 cm    3.3 <4.6 6.8 >3  Left Sural Anti Sensory (Lat Mall)  32.4 C  Calf    4.5 <4.6 5.7 >3     Stim Site NR Onset (ms) Norm Onset (ms) O-P Amp (mV) Norm O-P Amp Site1 Site2 Delta-0 (ms) Dist (cm) Vel (m/s) Norm Vel (m/s)  Left Peroneal Motor (Ext Dig Brev)  32.4 C  Ankle    4.5 <6.0 5.2 >2.5 B Fib Ankle 8.6 34.0 40 >40  B Fib    13.1  3.7  Poplt B Fib 2.5 10.0 40 >40  Poplt    15.6  3.6         Left Tibial Motor (Abd Hall Brev)  32.4 C  Ankle    5.3 <6.0 8.3 >4 Knee Ankle 10.6 43.0 41 >40   Knee    15.9  7.3          Electromyography   Side Muscle Ins.Act Fibs Fasc Recrt Amp Dur Poly Activation Comment  Left AntTibialis Nml Nml Nml *1- *1+ *1+ Nml Nml N/A  Left Gastroc Nml Nml Nml *1- *1+ *1+ Nml Nml N/A  Left Flex Dig Long Nml Nml Nml *1- *1+ *1+ Nml Nml N/A  Left RectFemoris Nml Nml Nml Nml Nml Nml Nml Nml N/A  Left GluteusMed Nml Nml Nml Nml Nml Nml Nml Nml N/A  Left BicepsFemS Nml Nml Nml *1- *1+ *1+ *1+ Nml N/A      Waveforms:

## 2022-01-26 ENCOUNTER — Telehealth: Payer: Self-pay

## 2022-01-26 ENCOUNTER — Telehealth: Payer: Self-pay | Admitting: Neurology

## 2022-01-26 NOTE — Telephone Encounter (Signed)
Called [facility] and he has picked up meds

## 2022-01-26 NOTE — Telephone Encounter (Signed)
Called and discussed the results of patient's EMG after the procedure today. 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  Patient's RLS symptoms have worsened. He has cut back on ropinirole but still takes 5 mg nightly at least 75% of the time. He also takes Lyrica. I recently increased this to 100 mg nightly, but patient has been taking 50 mg at onset of symptoms then 50 mg later when this does not help. He will then take the ropinirole if this does not work.  Plan: -Patient to take Lyrica 100 mg at night (all together) at 7-8 pm prior to symptom onset. -Will reduce ropinirole to 2.5 mg (1/2 tablet)  -Patient to call if this does not help or symptoms are worsening.  All questions were answered.  Kai Levins, MD [facility]

## 2022-01-27 DIAGNOSIS — G4731 Primary central sleep apnea: Secondary | ICD-10-CM | POA: Diagnosis not present

## 2022-01-27 DIAGNOSIS — G4733 Obstructive sleep apnea (adult) (pediatric): Secondary | ICD-10-CM | POA: Diagnosis not present

## 2022-02-18 ENCOUNTER — Other Ambulatory Visit: Payer: Self-pay | Admitting: Internal Medicine

## 2022-02-18 DIAGNOSIS — I10 Essential (primary) hypertension: Secondary | ICD-10-CM

## 2022-02-24 NOTE — Progress Notes (Signed)
NEUROLOGY FOLLOW UP OFFICE NOTE  Jay Fox [id number]  Subjective:  Jay Fox is a 71 y.o. year old right-handed male with a medical history of HTN, prostate cancer, renal cell carcinoma, CKD, restless leg syndrome, chronic low back pain, OSA (on BiPAP), prediabetes, depression, anxiety who we last saw on 12/02/21.  To briefly review: Patient has had RLS for 10-12 years. He describes it as a creepy crawly feeling in both feet (on bottom). He feels the urge to move his feet. If he gets up and walks, the sensation goes away. Poor sleep makes his symptoms worse. He takes ropinirole 5 mg at 9:30 pm and if his symptoms return or does not work, he takes another 5 mg (usually will be 11pm to 12am). Most nights he now takes both 5 mg doses (10 mg total). He thinks he has been on the ropinirole for about 2 years. He was on gabapentin and an under the tongue medication then. Gabapentin did not help. He does not remember the dose. He was started on 2.5 mg at that time.   Patient was seen by his PCP, Dr. Randol Kern on 11/30/21 for his symptoms. Per the clinic note, patient has a 10-12 year history of RLS. His symptoms have been progressively worsening despite increasing doses of ropinirole. He was taking 10 mg daily at that clinic visit. It was recommended the patient wean off ropinirole at some point. Labs and MRI lumbar spine was ordered. He was prescribed on pramipexole 0.5 mg TID and rotigotine patch (3 mg) daily. He has not gotten either yet. One was covered by insurance and one was not (he does not remember which today). He was also told to stop citalopram. Patient also mentioned a vein issue that has popped up over the last 6 months. He will be seeing a vascular specialist for this. Patient was referred to neurology for further work up and management of RLS.   Do RLS symptoms appear earlier than when ropinirole was first started? Sometimes yes, but not all the time. Over the last 6 months or so, he  now gets the symptoms when taking a nap or sitting for long periods. Are higher doses now needed or do you need to take the medication earlier to control symptoms? Yes Has intensity of symptoms worsened since starting ropinirole? No change in intensity, but happening more. Have symptoms spread to other body parts since starting ropinirole? No   He denies numbness and tingling when not having RLS symptoms (during the day). He does have a history of plantar fasciitis (bilateral) and Morton's neuroma on right foot (early 2000s).   He describes poor sleep due to sleep apnea but also insomnia. He previously saw sleep medicine in [place], but has no one here. He is interested in establishing care.   Patient also mentions achy teeth on the left side of mouth since 2016. He had a burning mouth for a while. A neurologist in [place] put his on gabapentin, which did not help. This resolved, but he recently had caps put on and the tooth pain has returned. The burning mouth has not returned.  Most recent Assessment and Plan (12/02/21): Overall, cause of patient's worsening RLS is not completely clear currently. Refractory RLS vs augmentation are both possibilities. He does not endorse all of normal symptoms associated with augmentation. Other contributing factors are also possible such as neuropathy (?small fiber from pre-diabetes) or radiculopathy (given chronic low back pain). I will look for treatable causes of neuropathy as well  as iron studies (but ferritin was normal). Patient does currently take iron supplement. He does not know how much.   Patient's hyper-reflexia in his lower extremities are likely related to his prior cervical spine injury, but does not seem to be causing significant symptoms otherwise. We discussed warning signs and to monitor for new symptoms.     PLAN: -Blood work: iron, TIBC, sat %, B12, IFE, SPEP -Will precede with EMG of LLE as this can evaluate for lumbar radiculopathy and  peripheral neuropathy. -Will hold off on MRI lumbar spine for now. May reconsider if EMG suggests radiculopathy -Hold off on starting new medications ordered by PCP (pramipexole 0.5 mg TID and rotigotine patch 3 mg daily). These could worsen symptoms if augmentation is cause of worsening. -Agree with stopping citalopram as SSRI can worsen RLS -Continue iron supplementation for now. Patient will message me to let me know how much he is taking. -Continue Ropinirole 5 mg 1-3 hours before bedtime (will try not to do 2). May consider further tapering. -Start Lyrica 50 mg 1-3 hours before bedtime -Sleep medicine referral to establish care for OSA and insomnia  Since their last visit: Patient messaged that his RLS was worsening on 01/11/22. I increased his Lyrica to 100 mg and asked that he reduce ropinirole to 5 mg (was taking 10 mg). I further reduced this to 2.5 mg (1/2 tablet on 01/26/22). Patient reported improvement on 02/05/22.  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  Since patient increased Lyrica to 100 mg, his symptoms have resolved. He continues to take magnesium. He no longer takes ropinirole.   MEDICATIONS:  Outpatient Encounter Medications as of 03/03/2022  Medication Sig   albuterol (PROVENTIL) (2.5 MG/3ML) 0.083% nebulizer solution Take 3 mLs (2.5 mg total) by nebulization every 6 (six) hours as needed for wheezing or shortness of breath.   albuterol (VENTOLIN HFA) 108 (90 Base) MCG/ACT inhaler Inhale 2 puffs into the lungs every 6 (six) hours as needed for wheezing or shortness of breath.   amLODipine (NORVASC) 5 MG tablet Take 2 tablets by mouth once daily   Ascorbic Acid (VITAMIN C PO) Take 1-2 tablets by mouth 2 (two) times daily. 539m   budesonide-formoterol (SYMBICORT) 160-4.5 MCG/ACT inhaler Inhale 2 puffs into the lungs 2 (two) times daily.   calcium  carbonate (TUMS - DOSED IN MG ELEMENTAL CALCIUM) 500 MG chewable tablet Chew 1 tablet by mouth daily.   Calcium Carbonate-Vit D-Min (CALCIUM 1200 PO) Take 1 tablet by mouth daily.   Cholecalciferol (VITAMIN D PO) Take 1 tablet by mouth daily.   Cholecalciferol (VITAMIN D3) 50 MCG (2000 UT) TABS Take 1 tablet by mouth daily.   diclofenac Sodium (VOLTAREN) 1 % GEL Place onto the skin.   fluticasone (FLONASE) 50 MCG/ACT nasal spray Place into the nose.   irbesartan (AVAPRO) 300 MG tablet Take 1 tablet (300 mg total) by mouth daily.   Multiple Vitamin (MULTIVITAMIN) tablet Take 1 tablet by mouth daily.   Omega-3 Fatty Acids (FISH OIL) 1200 MG CAPS Take 1,200 mg by mouth in the morning and at bedtime.   pregabalin (LYRICA) 50 MG capsule Take 2 capsules (100 mg total) by mouth at bedtime.   Respiratory Therapy Supplies MISC Filters Hoses tubing masks headgear cleaning supplies for dreamwear and  philips respironics bipap machine.   sildenafil (REVATIO) 20 MG tablet    Spacer/Aero-Holding Chambers (AEROCHAMBER  MV) inhaler Use as directed with albuterol inhaler.   TURMERIC PO Take 1,000 mg by mouth in the morning and at bedtime.   vitamin B-12 (CYANOCOBALAMIN) 500 MCG tablet Take 1 tablet by mouth daily.   zolpidem (AMBIEN) 10 MG tablet Take 1 tablet (10 mg total) by mouth at bedtime as needed for sleep.   [DISCONTINUED] ropinirole (REQUIP) 5 MG tablet Take 1 tablet (5 mg total) by mouth at bedtime.   No facility-administered encounter medications on file as of 03/03/2022.    PAST MEDICAL HISTORY: Past Medical History:  Diagnosis Date   Allergy 1953   Anxiety    Arthritis    Asthma    BPH (benign prostatic hypertrophy)    BPH (benign prostatic hypertrophy)    Cancer (HCC)    Cataract    Chronic kidney disease    Depression    GERD (gastroesophageal reflux disease)    History of kidney cancer    History of prostate cancer 11/30/2021   Hypertension    Leg swelling 11/30/2021   Noticed  early 2023 with ferritin deposition.    OSA (obstructive sleep apnea) 11/30/2021   Restless legs 11/30/2021   Seasonal allergies    Sleep apnea     PAST SURGICAL HISTORY: Past Surgical History:  Procedure Laterality Date   NASAL SINUS SURGERY  2019   RENAL CRYOABLATION     TONSILLECTOMY      ALLERGIES: Allergies  Allergen Reactions   Shellfish Allergy Rash, Shortness Of Breath and Swelling    FAMILY HISTORY: Family History  Problem Relation Age of Onset   Emphysema Mother    Liver cancer Mother    Pancreatic cancer Mother    Anxiety disorder Mother    Cancer Mother    Hypertension Mother    Multiple myeloma Father    Cancer Father    Hypertension Father    Colon cancer Paternal Grandmother    Colon cancer Paternal Grandfather     SOCIAL HISTORY: Social History   Tobacco Use   Smoking status: Never   Smokeless tobacco: Never  Vaping Use   Vaping Use: Never used  Substance Use Topics   Alcohol use: Yes    Alcohol/week: 10.0 standard drinks of alcohol    Types: 10 Standard drinks or equivalent per week    Comment: Varies sometimes not at all   Drug use: No   Social History   Social History Narrative   Right handed   Caffeine none   Lives in one story home      Objective:  Vital Signs:  BP (!) 142/63   Pulse 65   Ht 5' 9.5" (1.765 m)   Wt 179 lb 3.2 oz (81.3 kg)   SpO2 96%   BMI 26.08 kg/m   General: No acute distress.  Patient appears well-groomed.   Head:  Normocephalic/atraumatic Eyes:  Fundi examined but not visualized Neck: supple, no paraspinal tenderness, full range of motion Heart:  Regular rate and rhythm Lungs:  Clear to auscultation bilaterally Back: No paraspinal tenderness Neurological Exam: alert and oriented to person, place, and time.  Speech fluent and not dysarthric, language intact.  CN II-XII intact. Bulk and tone normal, muscle strength 5/5 throughout.  Sensation to light touch intact.  Deep tendon reflexes 2+ throughout.  Gait normal.  Labs and Imaging review: New results: 12/02/21: B12: 470 IFE: IgG lambda monoclonal ab present HbA1c: 6.0  EMG (01/25/22): NCV & EMG Findings: Extensive electrodiagnostic evaluation of the left lower limb shows:  Left sural and superficial peroneal sensory studies are within normal limits. Left peroneal (EDB) and tibial (AH) motor studies are within normal limits. Left H reflex response shows a mildly prolonged latency. Chronic motor axon loss changes without accompanying active denervation changes are seen in the left tibialis anterior, flexor digitorum longus, medial head of gastrocnemius, and short head of bicep femoris muscles.   Impression: This is an abnormal electrodiagnostic evaluation. 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 No electrodiagnostic evidence of a generalized polyneuropathy or myopathy.  Previously reviewed results: Lab Results  Component Value Date    HGBA1C 6.0 12/01/2021      12/01/21: Normal or unremarkable: CBC, CMP, Ferritin 134.1  Assessment/Plan:  This is Jay Fox, a 71 y.o. male with RLS. Currently well controlled on Lyrica 100 mg daily and off of ropinirole. 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  Plan: -Repeat IFE, will consider hematology consult if monoclonal protein is seen -Lyrica 100 mg qhs -Consider retrying melatonin to help sleep  Return to clinic in 6 months  Total time spent reviewing records, interview, history/exam, documentation, and coordination of care on day of encounter:  25 min  Kai Levins, MD

## 2022-02-25 ENCOUNTER — Encounter: Payer: Self-pay | Admitting: *Deleted

## 2022-03-03 ENCOUNTER — Ambulatory Visit: Payer: Medicare Other | Admitting: Neurology

## 2022-03-03 ENCOUNTER — Encounter: Payer: Self-pay | Admitting: Neurology

## 2022-03-03 ENCOUNTER — Other Ambulatory Visit (INDEPENDENT_AMBULATORY_CARE_PROVIDER_SITE_OTHER): Payer: Medicare Other

## 2022-03-03 VITALS — BP 142/63 | HR 65 | Ht 69.5 in | Wt 179.2 lb

## 2022-03-03 DIAGNOSIS — G2581 Restless legs syndrome: Secondary | ICD-10-CM | POA: Diagnosis not present

## 2022-03-03 DIAGNOSIS — R209 Unspecified disturbances of skin sensation: Secondary | ICD-10-CM

## 2022-03-03 DIAGNOSIS — M5417 Radiculopathy, lumbosacral region: Secondary | ICD-10-CM

## 2022-03-03 DIAGNOSIS — G4700 Insomnia, unspecified: Secondary | ICD-10-CM

## 2022-03-03 DIAGNOSIS — G4733 Obstructive sleep apnea (adult) (pediatric): Secondary | ICD-10-CM

## 2022-03-03 NOTE — Patient Instructions (Signed)
I would like to recheck a lab today. If it is abnormal again, I may consider sending you to a blood doctor (hematologist).  Continue Lyrica 100 mg at night.  Consider retrying melatonin to help with sleep.  I would like to see you again in 6 months.  The physicians and staff at [facility] are committed to providing excellent care. You may receive a survey requesting feedback about your experience at our office. We strive to receive "very good" responses to the survey questions. If you feel that your experience would prevent you from giving the office a "very good " response, please contact our office to try to remedy the situation. We may be reached at [phone]. Thank you for taking the time out of your busy day to complete the survey.  Kai Levins, MD [facility]

## 2022-03-10 LAB — IMMUNOFIXATION ELECTROPHORESIS
IgG (Immunoglobin G), Serum: 836 mg/dL (ref 600–1540)
IgM, Serum: 92 mg/dL (ref 50–300)
Immunoglobulin A: 151 mg/dL (ref 70–320)

## 2022-03-11 ENCOUNTER — Encounter: Payer: Self-pay | Admitting: Neurology

## 2022-03-12 ENCOUNTER — Other Ambulatory Visit: Payer: Self-pay

## 2022-03-12 DIAGNOSIS — D472 Monoclonal gammopathy: Secondary | ICD-10-CM

## 2022-03-24 DIAGNOSIS — M9903 Segmental and somatic dysfunction of lumbar region: Secondary | ICD-10-CM | POA: Diagnosis not present

## 2022-03-24 DIAGNOSIS — M5136 Other intervertebral disc degeneration, lumbar region: Secondary | ICD-10-CM | POA: Diagnosis not present

## 2022-03-29 ENCOUNTER — Encounter: Payer: Self-pay | Admitting: Internal Medicine

## 2022-03-29 ENCOUNTER — Ambulatory Visit (INDEPENDENT_AMBULATORY_CARE_PROVIDER_SITE_OTHER): Payer: Medicare Other | Admitting: Internal Medicine

## 2022-03-29 VITALS — BP 136/63 | HR 61 | Temp 98.1°F | Ht 69.5 in | Wt 182.0 lb

## 2022-03-29 DIAGNOSIS — R1084 Generalized abdominal pain: Secondary | ICD-10-CM | POA: Diagnosis not present

## 2022-03-29 DIAGNOSIS — R140 Abdominal distension (gaseous): Secondary | ICD-10-CM | POA: Diagnosis not present

## 2022-03-29 DIAGNOSIS — Z79899 Other long term (current) drug therapy: Secondary | ICD-10-CM | POA: Insufficient documentation

## 2022-03-29 DIAGNOSIS — S21209A Unspecified open wound of unspecified back wall of thorax without penetration into thoracic cavity, initial encounter: Secondary | ICD-10-CM

## 2022-03-29 HISTORY — DX: Generalized abdominal pain: R10.84

## 2022-03-29 HISTORY — DX: Unspecified open wound of unspecified back wall of thorax without penetration into thoracic cavity, initial encounter: S21.209A

## 2022-03-29 LAB — COMPREHENSIVE METABOLIC PANEL
ALT: 19 U/L (ref 0–53)
AST: 15 U/L (ref 0–37)
Albumin: 4.5 g/dL (ref 3.5–5.2)
Alkaline Phosphatase: 76 U/L (ref 39–117)
BUN: 20 mg/dL (ref 6–23)
CO2: 30 mEq/L (ref 19–32)
Calcium: 10.1 mg/dL (ref 8.4–10.5)
Chloride: 105 mEq/L (ref 96–112)
Creatinine, Ser: 0.98 mg/dL (ref 0.40–1.50)
GFR: 78.01 mL/min (ref >60.00)
Glucose, Bld: 111 mg/dL — ABNORMAL HIGH (ref 70–99)
Potassium: 4.7 mEq/L (ref 3.5–5.1)
Sodium: 143 mEq/L (ref 135–145)
Total Bilirubin: 0.5 mg/dL (ref 0.2–1.2)
Total Protein: 6.8 g/dL (ref 6.0–8.3)

## 2022-03-29 LAB — CBC
HCT: 43.3 % (ref 39.0–52.0)
Hemoglobin: 14.5 g/dL (ref 13.0–17.0)
MCHC: 33.4 g/dL (ref 30.0–36.0)
MCV: 88.6 fl (ref 78.0–100.0)
Platelets: 226 10*3/uL (ref 150.0–400.0)
RBC: 4.89 Mil/uL (ref 4.22–5.81)
RDW: 13.6 % (ref 11.5–15.5)
WBC: 6.6 10*3/uL (ref 4.0–10.5)

## 2022-03-29 LAB — AMYLASE: Amylase: 50 U/L (ref 27–131)

## 2022-03-29 LAB — LIPASE: Lipase: 20 U/L (ref 11.0–59.0)

## 2022-03-29 NOTE — Assessment & Plan Note (Signed)
I managing Ambien controlled substance and I have counseled him at each interaction and today that this is a high risk medication for his age can be associated with dementia and car crashes particular particular in the following morning but he still feels he really needs it he brought his pill bottle today to show me that he is using it appropriately she has about 30 pills and it many of which are half was filled on October 28 so it is pretty close to where it should be

## 2022-03-29 NOTE — Assessment & Plan Note (Signed)
Even though currently he does not have, in my medical opinion, an acute abdomen, his symptom(s) are very concerning due to extensive personal and family history cancer.  I recommended aggressive workup to incl

## 2022-03-29 NOTE — Progress Notes (Signed)
[facility]: [phone]   Routine Medical Office Visit  Patient:  Jay Fox      Age: 72 y.o.       Sex:  male  Date:   03/29/2022  PCP:    Loralee Pacas, [place] Provider: Loralee Pacas, MD   Problem Focused Charting:   Medical Decision Making per Assessment/Plan   Xzavian was seen today for stomach issues, acute visit, bloated and abdominal pain.  Generalized abdominal pain Assessment & Plan: Even though currently he does not have, in my medical opinion, an acute abdomen, his symptom(s) are very concerning due to extensive personal and family history cancer.  I recommended aggressive workup to incl   Orders: -     CBC; Future -     Comprehensive metabolic panel; Future -     PSA; Future -     CT ABDOMEN PELVIS W CONTRAST; Future -     Amylase -     Lipase  Bloating  High risk medication use Overview: Ambien for insomnia and pregabalin for rls   Assessment & Plan: I managing Ambien controlled substance and I have counseled him at each interaction and today that this is a high risk medication for his age can be associated with dementia and car crashes particular particular in the following morning but he still feels he really needs it he brought his pill bottle today to show me that he is using it appropriately she has about 30 pills and it many of which are half was filled on October 28 so it is pretty close to where it should be     Medical Decision Making: 1 undiagnosed new problem with uncertain prognosis     Ordering of each unique test;  5 lab tests and CT imaging ordered- requested the CT as urgent outpatient and discuss with referral coordinator.    Subjective - Clinical Presentation:   Jay Fox is a 72 y.o. male  Patient Active Problem List   Diagnosis Date Noted   Back wound 03/29/2022   Generalized abdominal pain 03/29/2022   High risk medication use 03/29/2022   Bloating 03/29/2022   History of  prostate cancer 11/30/2021   History of radical prostatectomy 11/30/2021   Leg swelling 11/30/2021   Restless leg syndrome 11/30/2021   Prediabetes 11/30/2021   OSA (obstructive sleep apnea) 11/30/2021   History of renal cell carcinoma 02/22/2019   Prostate cancer ([place]) 11/21/2017   Insomnia 09/08/2012   Urinary incontinence 05/15/2012   Skin lesions, generalized 05/15/2012   Asthma    Hypertension    Seasonal allergies    Heart palpitations 04/12/2011   Benign essential hypertension 04/12/2011   Asthma, moderate persistent 04/12/2011   Past Medical History:  Diagnosis Date   Allergy 1954   Anxiety    Arthritis    Asthma    BPH (benign prostatic hypertrophy)    BPH (benign prostatic hypertrophy)    Cancer (HCC)    Cataract    Chronic kidney disease    Depression    GERD (gastroesophageal reflux disease)    History of kidney cancer    History of prostate cancer 11/30/2021   Hypertension    Leg swelling 11/30/2021   Noticed early 2023 with ferritin deposition.    OSA (obstructive sleep apnea) 11/30/2021   Restless legs 11/30/2021   Seasonal allergies    Sleep apnea     Outpatient Medications Prior to Visit  Medication Sig   albuterol (  PROVENTIL) (2.5 MG/3ML) 0.083% nebulizer solution Take 3 mLs (2.5 mg total) by nebulization every 6 (six) hours as needed for wheezing or shortness of breath.   albuterol (VENTOLIN HFA) 108 (90 Base) MCG/ACT inhaler Inhale 2 puffs into the lungs every 6 (six) hours as needed for wheezing or shortness of breath.   amLODipine (NORVASC) 5 MG tablet Take 2 tablets by mouth once daily   Ascorbic Acid (VITAMIN C PO) Take 1-2 tablets by mouth 2 (two) times daily. '500mg'$    budesonide-formoterol (SYMBICORT) 160-4.5 MCG/ACT inhaler Inhale 2 puffs into the lungs 2 (two) times daily.   calcium carbonate (TUMS - DOSED IN MG ELEMENTAL CALCIUM) 500 MG chewable tablet Chew 1 tablet by mouth daily.   Calcium Carbonate-Vit D-Min (CALCIUM 1200 PO) Take 1 tablet  by mouth daily.   Cholecalciferol (VITAMIN D PO) Take 1 tablet by mouth daily.   Cholecalciferol (VITAMIN D3) 50 MCG (2000 UT) TABS Take 1 tablet by mouth daily.   diclofenac Sodium (VOLTAREN) 1 % GEL Place onto the skin.   fluticasone (FLONASE) 50 MCG/ACT nasal spray Place into the nose.   irbesartan (AVAPRO) 300 MG tablet Take 1 tablet (300 mg total) by mouth daily.   Multiple Vitamin (MULTIVITAMIN) tablet Take 1 tablet by mouth daily.   Omega-3 Fatty Acids (FISH OIL) 1200 MG CAPS Take 1,200 mg by mouth in the morning and at bedtime.   pregabalin (LYRICA) 50 MG capsule Take 2 capsules (100 mg total) by mouth at bedtime.   Respiratory Therapy Supplies MISC Filters Hoses tubing masks headgear cleaning supplies for dreamwear and  philips respironics bipap machine.   sildenafil (REVATIO) 20 MG tablet    Spacer/Aero-Holding Chambers (AEROCHAMBER MV) inhaler Use as directed with albuterol inhaler.   TURMERIC PO Take 1,000 mg by mouth in the morning and at bedtime.   vitamin B-12 (CYANOCOBALAMIN) 500 MCG tablet Take 1 tablet by mouth daily.   zolpidem (AMBIEN) 10 MG tablet Take 1 tablet (10 mg total) by mouth at bedtime as needed for sleep.   No facility-administered medications prior to visit.    Chief Complaint  Patient presents with   Stomach issues    For about a month-Intermittent discomfort, gassy, watery bm's, cramping, some pain that radiates into left side. Took Pepto bismol with some temporary relief (unable to complete bm-small portions). Blood on tissue sometimes-thinks its from wiping too much and having a bm too often.   Acute Visit   Bloated   Abdominal Pain    HPI   72 year old male with about 1 month of abdominal pain that is diffuse and symptoms go over to the left side and feels like mostly bloating associated with bright red blood on toilet paper but no blood in the stools.  His past medical history is significant for history of prostate cancer and history of renal cell  carcinoma.  He has no history of significant GI problems or GI surgeries in the past he denies any recent fevers chills weight loss vomiting bloody stools.  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 The pain comes & goes for about a month and feels related to some kind of gassy and juicy stuff.  Also the blood on tissue paper comes and goes and associated with history [facility]oscopy with [facility] years ago negative for same problem - attributed to  rectal ulcer that improved with cream.           Objective:  Physical Exam  BP 136/63 (BP Location: Left Arm, Patient Position: Sitting)   Pulse 61   Temp 98.1 F (36.7 C) (Temporal)   Ht 5' 9.5" (1.765 m)   Wt 182 lb (82.6 kg)   SpO2 97%   BMI 26.49 kg/m   Overweight  by BMI criteria but truncal adiposity (waist circumference or caliper) should be used instead. Wt Readings from Last 10 Encounters:  03/29/22 182 lb (82.6 kg)  03/03/22 179 lb 3.2 oz (81.3 kg)  01/04/22 177 lb (80.3 kg)  12/22/21 176 lb (79.8 kg)  12/14/21 176 lb 3.2 oz (79.9 kg)  12/11/21 178 lb 9.6 oz (81 kg)  12/02/21 181 lb (82.1 kg)  11/30/21 179 lb (81.2 kg)  07/14/21 183 lb 13.8 oz (83.4 kg)  06/29/21 185 lb 10 oz (84.2 kg)   Vital signs reviewed.  Nursing notes reviewed. Weight trend reviewed. General Appearance:  Well developed, well nourished male in no acute distress.   Normal work of breathing at rest Musculoskeletal: All extremities are intact.  Neurological:  Awake, alert,  No obvious focal neurological deficits or cognitive impairments Psychiatric:  Appropriate mood, pleasant demeanor Problem-specific findings:  abdomen is bloated feeling, no localizable tenderness rebound or guarding.   Results Reviewed: Results for orders placed or performed in visit on 03/29/22   Amylase  Result Value Ref Range   Amylase 50 27 - 131 U/L  Lipase  Result Value Ref Range   Lipase 20.0 11.0 - 59.0 U/L  Comprehensive metabolic panel  Result Value Ref Range   Sodium 143 135 - 145 mEq/L   Potassium 4.7 3.5 - 5.1 mEq/L   Chloride 105 96 - 112 mEq/L   CO2 30 19 - 32 mEq/L   Glucose, Bld 111 (H) 70 - 99 mg/dL   BUN 20 6 - 23 mg/dL   Creatinine, Ser 0.98 0.40 - 1.50 mg/dL   Total Bilirubin 0.5 0.2 - 1.2 mg/dL   Alkaline Phosphatase 76 39 - 117 U/L   AST 15 0 - 37 U/L   ALT 19 0 - 53 U/L   Total Protein 6.8 6.0 - 8.3 g/dL   Albumin 4.5 3.5 - 5.2 g/dL   GFR 78.01 >60.00 mL/min   Calcium 10.1 8.4 - 10.5 mg/dL  CBC  Result Value Ref Range   WBC 6.6 4.0 - 10.5 K/uL   RBC 4.89 4.22 - 5.81 Mil/uL   Platelets 226.0 150.0 - 400.0 K/uL   Hemoglobin 14.5 13.0 - 17.0 g/dL   HCT 43.3 39.0 - 52.0 %   MCV 88.6 78.0 - 100.0 fl   MCHC 33.4 30.0 - 36.0 g/dL   RDW 13.6 11.5 - 15.5 %    Recent Results (from the past 2160 hour(s))  Immunofixation electrophoresis     Status: None   Collection Time: 03/03/22  9:33 AM  Result Value Ref Range   Immunofix Electr Int      Comment: . A faint IgG (lambda) monoclonal immunoglobulin is detected. .    Immunoglobulin A 151 70 - 320 mg/dL   IgG (Immunoglobin G), Serum 836 600 - 1,540 mg/dL   IgM, Serum 92 50 - 300 mg/dL  Amylase     Status: None   Collection Time: 03/29/22 11:14 AM  Result Value Ref Range   Amylase 50 27 - 131 U/L  Lipase     Status: None   Collection  Time: 03/29/22 11:14 AM  Result Value Ref Range   Lipase 20.0 11.0 - 59.0 U/L  Comprehensive metabolic panel     Status: Abnormal   Collection Time: 03/29/22 11:14 AM  Result Value Ref Range   Sodium 143 135 - 145 mEq/L   Potassium 4.7 3.5 - 5.1 mEq/L   Chloride 105 96 - 112 mEq/L   CO2 30 19 - 32 mEq/L   Glucose, Bld 111 (H) 70 - 99 mg/dL   BUN 20 6 - 23 mg/dL   Creatinine, Ser 0.98 0.40 - 1.50 mg/dL   Total Bilirubin 0.5 0.2 - 1.2 mg/dL   Alkaline  Phosphatase 76 39 - 117 U/L   AST 15 0 - 37 U/L   ALT 19 0 - 53 U/L   Total Protein 6.8 6.0 - 8.3 g/dL   Albumin 4.5 3.5 - 5.2 g/dL   GFR 78.01 >60.00 mL/min    Comment: Calculated using the CKD-EPI Creatinine Equation (2021)   Calcium 10.1 8.4 - 10.5 mg/dL  CBC     Status: None   Collection Time: 03/29/22 11:14 AM  Result Value Ref Range   WBC 6.6 4.0 - 10.5 K/uL   RBC 4.89 4.22 - 5.81 Mil/uL   Platelets 226.0 150.0 - 400.0 K/uL   Hemoglobin 14.5 13.0 - 17.0 g/dL   HCT 43.3 39.0 - 52.0 %   MCV 88.6 78.0 - 100.0 fl   MCHC 33.4 30.0 - 36.0 g/dL   RDW 13.6 11.5 - 15.5 %          Signed: Loralee Pacas, MD 03/29/2022 5:12 PM

## 2022-03-30 ENCOUNTER — Encounter (HOSPITAL_BASED_OUTPATIENT_CLINIC_OR_DEPARTMENT_OTHER): Payer: Self-pay

## 2022-03-30 ENCOUNTER — Ambulatory Visit (HOSPITAL_BASED_OUTPATIENT_CLINIC_OR_DEPARTMENT_OTHER)
Admission: RE | Admit: 2022-03-30 | Discharge: 2022-03-30 | Disposition: A | Discharge status: Home / Self Care | Payer: Medicare Other | Source: Ambulatory Visit | Attending: Internal Medicine | Admitting: Internal Medicine

## 2022-03-30 DIAGNOSIS — K5730 Diverticulosis of large intestine without perforation or abscess without bleeding: Secondary | ICD-10-CM | POA: Diagnosis not present

## 2022-03-30 DIAGNOSIS — R1084 Generalized abdominal pain: Secondary | ICD-10-CM | POA: Diagnosis not present

## 2022-03-30 DIAGNOSIS — K769 Liver disease, unspecified: Secondary | ICD-10-CM | POA: Diagnosis not present

## 2022-03-30 DIAGNOSIS — N281 Cyst of kidney, acquired: Secondary | ICD-10-CM | POA: Diagnosis not present

## 2022-03-30 LAB — PSA: PSA: 0 ng/mL — ABNORMAL LOW (ref 0.10–4.00)

## 2022-03-30 MED ORDER — IOHEXOL 300 MG/ML  SOLN
100.0000 mL | Freq: Once | INTRAMUSCULAR | Status: AC | PRN
  Administered 2022-03-30: 85 mL via INTRAVENOUS

## 2022-03-30 NOTE — Progress Notes (Signed)
I have reviewed your recent results and, in my medical opinion:  This scan is reassuring that cancer has not returned.  Unfortunately it doesn't offer a cause for the last month of generalized bloating and discomfort.  I think the next step would be to see a gastrointestinal specialist and repeat the colonoscopy from 2 years ago, if the problem persists.  I recommend calling now for an appointment with [facility].  However, I also personally reviewed the CT and I thought there was a lot of fluid and poop in the bowels.... so I theorize that maybe the intestines aren't squeezing good due to the recent Lyrica.   There is a medicine called senna or exlax that you can buy over the counter to get the intestines to squeeze better and I suggest taking that and see if it clears up the problem first  Loralee Pacas, MD  03/30/2022 1:40 PM

## 2022-04-02 ENCOUNTER — Telehealth (HOSPITAL_COMMUNITY): Payer: Self-pay

## 2022-04-05 ENCOUNTER — Inpatient Hospital Stay: Payer: Medicare Other

## 2022-04-05 ENCOUNTER — Inpatient Hospital Stay: Payer: Medicare Other | Attending: Hematology | Admitting: Hematology

## 2022-04-05 ENCOUNTER — Other Ambulatory Visit: Payer: Self-pay

## 2022-04-05 DIAGNOSIS — D472 Monoclonal gammopathy: Secondary | ICD-10-CM | POA: Diagnosis not present

## 2022-04-05 LAB — CBC WITH DIFFERENTIAL (CANCER CENTER ONLY)
Abs Immature Granulocytes: 0.02 10*3/uL (ref 0.00–0.07)
Basophils Absolute: 0.1 10*3/uL (ref 0.0–0.1)
Basophils Relative: 1 %
Eosinophils Absolute: 0.4 10*3/uL (ref 0.0–0.5)
Eosinophils Relative: 6 %
HCT: 43.7 % (ref 39.0–52.0)
Hemoglobin: 14.7 g/dL (ref 13.0–17.0)
Immature Granulocytes: 0 %
Lymphocytes Relative: 20 %
Lymphs Abs: 1.1 10*3/uL (ref 0.7–4.0)
MCH: 29.6 pg (ref 26.0–34.0)
MCHC: 33.6 g/dL (ref 30.0–36.0)
MCV: 88.1 fL (ref 80.0–100.0)
Monocytes Absolute: 0.6 10*3/uL (ref 0.1–1.0)
Monocytes Relative: 11 %
Neutro Abs: 3.6 10*3/uL (ref 1.7–7.7)
Neutrophils Relative %: 62 %
Platelet Count: 220 10*3/uL (ref 150–400)
RBC: 4.96 MIL/uL (ref 4.22–5.81)
RDW: 13.1 % (ref 11.5–15.5)
WBC Count: 5.8 10*3/uL (ref 4.0–10.5)
nRBC: 0 % (ref 0.0–0.2)

## 2022-04-05 LAB — CMP (CANCER CENTER ONLY)
ALT: 20 U/L (ref 0–44)
AST: 15 U/L (ref 15–41)
Albumin: 4.4 g/dL (ref 3.5–5.0)
Alkaline Phosphatase: 69 U/L (ref 38–126)
Anion gap: 6 (ref 5–15)
BUN: 19 mg/dL (ref 8–23)
CO2: 28 mmol/L (ref 22–32)
Calcium: 10.2 mg/dL (ref 8.9–10.3)
Chloride: 106 mmol/L (ref 98–111)
Creatinine: 0.97 mg/dL (ref 0.61–1.24)
GFR, Estimated: >60 mL/min (ref >60)
Glucose, Bld: 110 mg/dL — ABNORMAL HIGH (ref 70–99)
Potassium: 4.2 mmol/L (ref 3.5–5.1)
Sodium: 140 mmol/L (ref 135–145)
Total Bilirubin: 0.5 mg/dL (ref 0.3–1.2)
Total Protein: 7 g/dL (ref 6.5–8.1)

## 2022-04-05 LAB — LACTATE DEHYDROGENASE: LDH: 131 U/L (ref 98–192)

## 2022-04-05 NOTE — Progress Notes (Signed)
HEMATOLOGY/ONCOLOGY CONSULTATION NOTE  Date of Service: 04/05/2022  Patient Care Team: Loralee Pacas, MD as PCP - General (Internal Medicine) Elsie Stain, MD as Attending Physician (Pulmonary Disease) Shellia Carwin, MD as Consulting Physician (Neurology)  CHIEF COMPLAINTS/PURPOSE OF CONSULTATION:  monoclonal gammopathy   HISTORY OF PRESENTING ILLNESS:   Jay Fox is a wonderful 72 y.o. male who has been referred to [place] by Dr. 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 Patient is referred to [place] by Neurologist Dr. Berdine Addison due to noticing a faint band on his IFE/IgG.  Patient has been diagnosed with restless leg syndrome by his Neurologist, Dr. Berdine Addison.   He had prostate cancer and kidney cancer is the past. He has had prostatectomy in the previous and radiation. He is currently following up with his urologist Dr. Milford Cage. Patient notes that his PSA has been stable at 0.   He reports that his father had multiple myeloma. He reports that his mother had gallbladder cancer.   Patient notes that he had some chemical exposure when he used to work.   Patient reports he is doing well during this visit. He denies fever, chills, night sweats, new infection issues, bone pain, abdominal pain, chest pain, urination problems, testicular pain/swelling, or leg swelling. He does reports of chronic back pain. He is currently recovering from cold.   Patient reports his restless leg is well-controlled with Lyrica 50 mg.    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    OSA (obstructive sleep apnea) 11/30/2021   Restless legs 11/30/2021   Seasonal allergies    Sleep apnea     SURGICAL HISTORY: Past Surgical History:  Procedure Laterality Date   NASAL SINUS SURGERY  2019   RENAL CRYOABLATION     TONSILLECTOMY      SOCIAL HISTORY: Social History   Socioeconomic History   Marital status: Married    Spouse name: Not on file   Number of children: 3   Years of education: 12   Highest education level: 12th grade  Occupational History   Occupation: Retired  Tobacco Use   Smoking status: Never   Smokeless tobacco: Never  Vaping Use   Vaping Use: Never used  Substance and Sexual Activity   Alcohol use: Yes    Alcohol/week: 10.0 standard drinks of alcohol    Types: 10 Standard drinks or equivalent per week    Comment: Varies sometimes not at all   Drug use: No   Sexual activity: Not Currently  Other Topics Concern   Not on file  Social History Narrative   Right handed   Caffeine none   Lives in one story home   Social Determinants of Health   Financial Resource Strain: Low Risk  (12/22/2021)   Overall Financial Resource Strain (CARDIA)    Difficulty of Paying Living Expenses: Not hard at all  Food Insecurity: No Food Insecurity (12/22/2021)   Hunger Vital Sign    Worried  About Running Out of Food in the Last Year: Never true    Ran Out of Food in the Last Year: Never true  Transportation Needs: No Transportation Needs (12/22/2021)   PRAPARE - Hydrologist (Medical): No    Lack of Transportation (Non-Medical): No  Physical Activity: Insufficiently Active (12/22/2021)   Exercise Vital Sign    Days of Exercise per Week: 2 days    Minutes of Exercise per Session: 30 min  Stress: No Stress Concern Present (12/22/2021)   [place]    Feeling of Stress : Not at all  Social Connections: Moderately Integrated (12/22/2021)   Social  Connection and Isolation Panel [NHANES]    Frequency of Communication with Friends and Family: Twice a week    Frequency of Social Gatherings with Friends and Family: More than three times a week    Attends Religious Services: More than 4 times per year    Active Member of [employer] or Organizations: No    Attends Archivist Meetings: Never    Marital Status: Married  Human resources officer Violence: Not At Risk (12/22/2021)   Humiliation, Afraid, Rape, and Kick questionnaire    Fear of Current or Ex-Partner: No    Emotionally Abused: No    Physically Abused: No    Sexually Abused: No    FAMILY HISTORY: Family History  Problem Relation Age of Onset   Emphysema Mother    Liver cancer Mother    Pancreatic cancer Mother    Anxiety disorder Mother    Cancer Mother    Hypertension Mother    Multiple myeloma Father    Cancer Father    Hypertension Father    Colon cancer Paternal Grandmother    Colon cancer Paternal Grandfather     ALLERGIES:  is allergic to shellfish allergy.  MEDICATIONS:  Current Outpatient Medications  Medication Sig Dispense Refill   albuterol (PROVENTIL) (2.5 MG/3ML) 0.083% nebulizer solution Take 3 mLs (2.5 mg total) by nebulization every 6 (six) hours as needed for wheezing or shortness of breath. 150 mL 1   albuterol (VENTOLIN HFA) 108 (90 Base) MCG/ACT inhaler Inhale 2 puffs into the lungs every 6 (six) hours as needed for wheezing or shortness of breath. 18 g 11   amLODipine (NORVASC) 5 MG tablet Take 2 tablets by mouth once daily 90 tablet 0   Ascorbic Acid (VITAMIN C PO) Take 1-2 tablets by mouth 2 (two) times daily. '500mg'$      budesonide-formoterol (SYMBICORT) 160-4.5 MCG/ACT inhaler Inhale 2 puffs into the lungs 2 (two) times daily. 1 each 12   calcium carbonate (TUMS - DOSED IN MG ELEMENTAL CALCIUM) 500 MG chewable tablet Chew 1 tablet by mouth daily.     Calcium Carbonate-Vit D-Min (CALCIUM 1200 PO) Take 1 tablet by mouth daily.     Cholecalciferol  (VITAMIN D PO) Take 1 tablet by mouth daily.     Cholecalciferol (VITAMIN D3) 50 MCG (2000 UT) TABS Take 1 tablet by mouth daily.     diclofenac Sodium (VOLTAREN) 1 % GEL Place onto the skin.     fluticasone (FLONASE) 50 MCG/ACT nasal spray Place into the nose.     irbesartan (AVAPRO) 300 MG tablet Take 1 tablet (300 mg total) by mouth daily. 90 tablet 1   Multiple Vitamin (MULTIVITAMIN) tablet Take 1 tablet by mouth daily.     Omega-3 Fatty Acids (FISH OIL) 1200 MG CAPS Take 1,200 mg by mouth  in the morning and at bedtime.     pregabalin (LYRICA) 50 MG capsule Take 2 capsules (100 mg total) by mouth at bedtime. 60 capsule 5   Respiratory Therapy Supplies MISC Filters Hoses tubing masks headgear cleaning supplies for dreamwear and  philips respironics bipap machine. 2 each 0   sildenafil (REVATIO) 20 MG tablet      Spacer/Aero-Holding Chambers (AEROCHAMBER MV) inhaler Use as directed with albuterol inhaler. 1 each 0   TURMERIC PO Take 1,000 mg by mouth in the morning and at bedtime.     vitamin B-12 (CYANOCOBALAMIN) 500 MCG tablet Take 1 tablet by mouth daily.     zolpidem (AMBIEN) 10 MG tablet Take 1 tablet (10 mg total) by mouth at bedtime as needed for sleep. 90 tablet 1   No current facility-administered medications for this visit.    REVIEW OF SYSTEMS:    10 Point review of Systems was done is negative except as noted above.  PHYSICAL EXAMINATION: ECOG PERFORMANCE STATUS: 1 - Symptomatic but completely ambulatory  VS reviewed  GENERAL:alert, in no acute distress and comfortable SKIN: no acute rashes, no significant lesions EYES: conjunctiva are pink and non-injected, sclera anicteric OROPHARYNX: MMM, no exudates, no oropharyngeal erythema or ulceration NECK: supple, no JVD LYMPH:  no palpable lymphadenopathy in the cervical, axillary or inguinal regions LUNGS: clear to auscultation b/l with normal respiratory effort HEART: regular rate & rhythm ABDOMEN:  normoactive bowel  sounds , non tender, not distended. Extremity: no pedal edema PSYCH: alert & oriented x 3 with fluent speech NEURO: no focal motor/sensory deficits  LABORATORY DATA:  I have reviewed the data as listed .    Latest Ref Rng & Units 04/05/2022   12:17 PM 03/29/2022   11:14 AM 12/01/2021   11:11 AM  CBC  WBC 4.0 - 10.5 K/uL 5.8  6.6  5.0   Hemoglobin 13.0 - 17.0 g/dL 14.7  14.5  13.6   Hematocrit 39.0 - 52.0 % 43.7  43.3  40.7   Platelets 150 - 400 K/uL 220  226.0  198.0    .    Latest Ref Rng & Units 04/05/2022   12:17 PM 03/29/2022   11:14 AM 12/02/2021   10:57 AM  CMP  Glucose 70 - 99 mg/dL 110  111    BUN 8 - 23 mg/dL 19  20    Creatinine 0.61 - 1.24 mg/dL 0.97  0.98    Sodium 135 - 145 mmol/L 140  143    Potassium 3.5 - 5.1 mmol/L 4.2  4.7    Chloride 98 - 111 mmol/L 106  105    CO2 22 - 32 mmol/L 28  30    Calcium 8.9 - 10.3 mg/dL 10.2  10.1    Total Protein 6.5 - 8.1 g/dL 7.0  6.8  6.8   Total Bilirubin 0.3 - 1.2 mg/dL 0.5  0.5    Alkaline Phos 38 - 126 U/L 69  76    AST 15 - 41 U/L 15  15    ALT 0 - 44 U/L 20  19       RADIOGRAPHIC STUDIES: I have personally reviewed the radiological images as listed and agreed with the findings in the report. CT Abdomen Pelvis W Contrast  Result Date: 03/30/2022 CLINICAL DATA:  Abdominal pain for 1 month which radiates to the left. Irregular bowel movements. History of prostate cancer and renal cell carcinoma. EXAM: CT ABDOMEN AND PELVIS WITH CONTRAST TECHNIQUE: Multidetector CT imaging of  the abdomen and pelvis was performed using the standard protocol following bolus administration of intravenous contrast. RADIATION DOSE REDUCTION: This exam was performed according to the departmental dose-optimization program which includes automated exposure control, adjustment of the mA and/or kV according to patient size and/or use of iterative reconstruction technique. CONTRAST:  38m OMNIPAQUE IOHEXOL 300 MG/ML  SOLN COMPARISON:  11/13/2021  FINDINGS: Lower chest: No pleural fluid or airspace disease. Scarring within the right middle lobe and lingula is again seen. Hepatobiliary: Small hypodense lesion within segment 8/4 is unchanged measuring 5 mm. No suspicious liver lesions identified on today's study. Gallbladder appears normal. No bile duct dilatation. Pancreas: Unremarkable. No pancreatic ductal dilatation or surrounding inflammatory changes. Spleen: Normal in size without focal abnormality. Adrenals/Urinary Tract: Normal adrenal glands. Stable postoperative change from partial nephrectomy off the upper pole of the left kidney. Bilateral kidney cysts are again seen in appears stable in the interval. The largest arises off the medial cortex of the left kidney measuring 3.3 cm. No nephrolithiasis or hydronephrosis. Urinary bladder appears normal. Stomach/Bowel: Stomach is within normal limits. The appendix is visualized and appears normal. Sigmoid diverticulosis without signs of acute diverticulitis. No bowel wall thickening or inflammation. No pathologic dilatation of the large or small bowel loops. Vascular/Lymphatic: Normal appearance of the abdominal aorta. No enlarged abdominopelvic adenopathy. Reproductive: Status post prostatectomy. Other: No free fluid or fluid collections. Small fat containing umbilical hernia. No signs of pneumoperitoneum. Musculoskeletal: Mild multilevel lumbar spondylosis. No acute or suspicious osseous findings. IMPRESSION: 1. No acute findings within the abdomen or pelvis. 2. Sigmoid diverticulosis without signs of acute diverticulitis. 3. Status post partial nephrectomy off the upper pole of the left kidney. No signs of recurrent or metastatic disease. 4. Status post prostatectomy. Electronically Signed   By: TKerby MoorsM.D.   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  -educated the patient on IgG lambda  monoclonal abnormal protein levels. Patient has a marginal high level of IgG level.  -educated the patient on what can cause a slight increase of IgG level. -Discussed the recent nerve conduction study results ordered by Neurologist.  -Educated the patient on multiple myeloma and signs of multiple myeloma.  -Discussed the next steps including 24 hour urine test an labs during this visit. Hold on whole body scans for now.  -discussed the CT Abdominal results from 03/30/2022, which showed no acute findings within the abdomen or pelvis, Sigmoid diverticulosis without signs of acute diverticulitis, Status post partial nephrectomy off the upper pole of the left Kidney, No signs of recurrent or metastatic disease.  . Orders Placed This Encounter  Procedures   CBC with Differential ([place])    Standing Status:   Future    Number of Occurrences:   1    Standing Expiration Date:   04/06/2023   CMP ([place])    Standing Status:   Future    Number of Occurrences:   1    Standing Expiration Date:   04/06/2023   Multiple Myeloma Panel (SPEP&IFE w/QIG)    Standing Status:   Future    Number of Occurrences:   1    Standing Expiration Date:   04/06/2023   Kappa/lambda light chains    Standing Status:   Future    Number of Occurrences:   1    Standing Expiration Date:   04/06/2023  Beta 2 microglobulin    Standing Status:   Future    Number of Occurrences:   1    Standing Expiration Date:   04/05/2023   Lactate dehydrogenase    Standing Status:   Future    Number of Occurrences:   1    Standing Expiration Date:   04/06/2023   24-Hr Ur UPEP/UIFE/Light Chains/TP    Standing Status:   Future    Number of Occurrences:   1    Standing Expiration Date:   04/05/2023    FOLLOW-UP: Labs today Phone visit with Dr Irene Limbo in 3 weeks .The total time spent in the appointment was 50 minutes* .  All of the patient's questions were answered with apparent satisfaction. The patient knows to  call the clinic with any problems, questions or concerns.   Sullivan Lone MD MS AAHIVMS [facility] Hematology/Oncology Physician [facility]  .*Total Encounter Time as defined by the Centers for Medicare and Medicaid Services includes, in addition to the face-to-face time of a patient visit (documented in the note above) non-face-to-face time: obtaining and reviewing outside history, ordering and reviewing medications, tests or procedures, care coordination (communications with other health care professionals or caregivers) and documentation in the medical record.   04/05/2022 9:46 AM   I, Cleda Mccreedy, am acting as a Education administrator for Sullivan Lone, MD. .I have reviewed the above documentation for accuracy and completeness, and I agree with the above. Brunetta Genera MD

## 2022-04-06 ENCOUNTER — Other Ambulatory Visit: Payer: Self-pay | Admitting: Internal Medicine

## 2022-04-06 DIAGNOSIS — I10 Essential (primary) hypertension: Secondary | ICD-10-CM

## 2022-04-06 LAB — BETA 2 MICROGLOBULIN, SERUM: Beta-2 Microglobulin: 1.5 mg/L (ref 0.6–2.4)

## 2022-04-07 ENCOUNTER — Other Ambulatory Visit: Payer: Self-pay | Admitting: *Deleted

## 2022-04-07 ENCOUNTER — Other Ambulatory Visit: Payer: Self-pay

## 2022-04-07 DIAGNOSIS — I10 Essential (primary) hypertension: Secondary | ICD-10-CM

## 2022-04-07 DIAGNOSIS — D472 Monoclonal gammopathy: Secondary | ICD-10-CM | POA: Diagnosis not present

## 2022-04-07 LAB — KAPPA/LAMBDA LIGHT CHAINS
Kappa free light chain: 18.5 mg/L (ref 3.3–19.4)
Kappa, lambda light chain ratio: 1.31 (ref 0.26–1.65)
Lambda free light chains: 14.1 mg/L (ref 5.7–26.3)

## 2022-04-07 MED ORDER — AMLODIPINE BESYLATE 10 MG PO TABS: 10.0000 mg | ORAL_TABLET | Freq: Every day | ORAL | 3 refills | Status: DC

## 2022-04-07 NOTE — Telephone Encounter (Signed)
Discontinued the amlodipine 5 mg.

## 2022-04-07 NOTE — Telephone Encounter (Signed)
Spoke with patient and sent in prescription of amlodipine 10 mg daily per his request.

## 2022-04-08 LAB — MULTIPLE MYELOMA PANEL, SERUM
Albumin SerPl Elph-Mcnc: 3.8 g/dL (ref 2.9–4.4)
Albumin/Glob SerPl: 1.4 (ref 0.7–1.7)
Alpha 1: 0.2 g/dL (ref 0.0–0.4)
Alpha2 Glob SerPl Elph-Mcnc: 0.7 g/dL (ref 0.4–1.0)
B-Globulin SerPl Elph-Mcnc: 1 g/dL (ref 0.7–1.3)
Gamma Glob SerPl Elph-Mcnc: 0.9 g/dL (ref 0.4–1.8)
Globulin, Total: 2.8 g/dL (ref 2.2–3.9)
IgA: 149 mg/dL (ref 61–437)
IgG (Immunoglobin G), Serum: 787 mg/dL (ref 603–1613)
IgM (Immunoglobulin M), Srm: 88 mg/dL (ref 20–172)
Total Protein ELP: 6.6 g/dL (ref 6.0–8.5)

## 2022-04-09 LAB — UPEP/UIFE/LIGHT CHAINS/TP, 24-HR UR
% BETA, Urine: 23.1 %
ALPHA 1 URINE: 9.4 %
Albumin, U: 50.3 %
Alpha 2, Urine: 11.9 %
Free Kappa Lt Chains,Ur: 12.36 mg/L (ref 1.17–86.46)
Free Kappa/Lambda Ratio: 7.02 (ref 1.83–14.26)
Free Lambda Lt Chains,Ur: 1.76 mg/L (ref 0.27–15.21)
GAMMA GLOBULIN URINE: 5.4 %
Total Protein, Urine-Ur/day: 122 mg/24 hr (ref 30–150)
Total Protein, Urine: 7.9 mg/dL
Total Volume: 1550

## 2022-04-20 DIAGNOSIS — M5136 Other intervertebral disc degeneration, lumbar region: Secondary | ICD-10-CM | POA: Diagnosis not present

## 2022-04-20 DIAGNOSIS — M9903 Segmental and somatic dysfunction of lumbar region: Secondary | ICD-10-CM | POA: Diagnosis not present

## 2022-04-21 DIAGNOSIS — M1711 Unilateral primary osteoarthritis, right knee: Secondary | ICD-10-CM | POA: Diagnosis not present

## 2022-04-28 ENCOUNTER — Inpatient Hospital Stay: Payer: Medicare Other | Attending: Hematology | Admitting: Hematology

## 2022-04-28 DIAGNOSIS — Z85528 Personal history of other malignant neoplasm of kidney: Secondary | ICD-10-CM | POA: Insufficient documentation

## 2022-04-28 DIAGNOSIS — D472 Monoclonal gammopathy: Secondary | ICD-10-CM | POA: Diagnosis not present

## 2022-04-28 DIAGNOSIS — R109 Unspecified abdominal pain: Secondary | ICD-10-CM | POA: Diagnosis not present

## 2022-04-28 DIAGNOSIS — Z9079 Acquired absence of other genital organ(s): Secondary | ICD-10-CM | POA: Insufficient documentation

## 2022-04-28 DIAGNOSIS — G2581 Restless legs syndrome: Secondary | ICD-10-CM | POA: Insufficient documentation

## 2022-04-28 DIAGNOSIS — Z923 Personal history of irradiation: Secondary | ICD-10-CM | POA: Insufficient documentation

## 2022-04-28 DIAGNOSIS — Z79899 Other long term (current) drug therapy: Secondary | ICD-10-CM | POA: Diagnosis not present

## 2022-04-28 DIAGNOSIS — Z8546 Personal history of malignant neoplasm of prostate: Secondary | ICD-10-CM | POA: Insufficient documentation

## 2022-04-28 DIAGNOSIS — Z800 Family history of malignant neoplasm of digestive organs: Secondary | ICD-10-CM | POA: Insufficient documentation

## 2022-04-28 NOTE — Progress Notes (Signed)
HEMATOLOGY/ONCOLOGY CONSULTATION NOTE  Date of Service: 04/28/2022  Patient Care Team: Jay Pacas, MD as PCP - General (Internal Medicine) Jay Stain, MD as Attending Physician (Pulmonary Disease) Jay Carwin, MD as Consulting Physician (Neurology)  CHIEF COMPLAINTS/PURPOSE OF CONSULTATION:  monoclonal gammopathy   HISTORY OF PRESENTING ILLNESS:   Jay Fox is a wonderful 72 y.o. 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 Patient is referred to [place] by Neurologist Jay Fox due to noticing a faint band on his IFE/IgG.  Patient has been diagnosed with restless leg syndrome by his Neurologist, Jay Fox.   He had prostate cancer and kidney cancer is the past. He has had prostatectomy in the previous and radiation. He is currently following up with his urologist Jay Fox. Patient notes that his PSA has been stable at 0.   He reports that his father had multiple myeloma. He reports that his mother had gallbladder cancer.   Patient notes that he had some chemical exposure when he used to work.   Patient reports he is doing well during this visit. He denies fever, chills, night sweats, new infection issues, bone pain, abdominal pain, chest pain, urination problems, testicular pain/swelling, or leg swelling. He does reports of chronic back pain. He is currently recovering from cold.   Patient reports his restless leg is well-controlled with Lyrica 50 mg.   INTERVAL HISTORY: Jay Fox is a wonderful 72 y.o. is connected via phone for continued evaluation and management of monoclonal gammopath. Patient was initially seen by me on 04/05/2022.  .I connected with Jay Fox on 04/28/2022 at 12:00 PM EST by telephone visit and verified that I am speaking with the correct person using two identifiers.   Patient reports he has been doing fairly well since our last visit. He does complain of abdominal  pain, which he is following up with his PCP.  We discussed his lab results from 04/05/2022 which did not show any abnormalities. Patient did not have any abnormal protein.   I discussed the limitations, risks, security and privacy concerns of performing an evaluation and management service by telemedicine and the availability of in-person appointments. I also discussed with the patient that there may be a patient responsible charge related to this service. The patient expressed understanding and agreed to proceed.   Other persons participating in the visit and their role in the encounter: None   Patient's location: Home  Provider's location: [facility]   Chief Complaint: monoclonal gammopathy     MEDICAL HISTORY:  Past Medical History:  Diagnosis Date   Allergy 1953   Anxiety    Arthritis    Asthma    BPH (benign prostatic hypertrophy)    BPH (benign prostatic hypertrophy)    Cancer (HCC)    Cataract    Chronic kidney disease    Depression    GERD (gastroesophageal reflux disease)    History of kidney cancer    History of prostate cancer 11/30/2021   Hypertension    Leg swelling 11/30/2021   Noticed early 2023 with ferritin deposition.    OSA (obstructive sleep apnea) 11/30/2021   Restless legs 11/30/2021   Seasonal allergies    Sleep apnea     SURGICAL HISTORY: Past Surgical History:  Procedure Laterality Date   NASAL SINUS SURGERY  2019   RENAL CRYOABLATION     TONSILLECTOMY      SOCIAL HISTORY: Social History  Socioeconomic History   Marital status: Married    Spouse name: Not on file   Number of children: 3   Years of education: 52   Highest education level: 12th grade  Occupational History   Occupation: Retired  Tobacco Use   Smoking status: Never   Smokeless tobacco: Never  Vaping Use   Vaping Use: Never used  Substance and Sexual Activity   Alcohol use: Yes    Alcohol/week: 10.0 standard drinks of alcohol    Types: 10 Standard drinks or equivalent per  week    Comment: Varies sometimes not at all   Drug use: No   Sexual activity: Not Currently  Other Topics Concern   Not on file  Social History Narrative   Right handed   Caffeine none   Lives in one story home   Social Determinants of Health   Financial Resource Strain: Low Risk  (12/22/2021)   Overall Financial Resource Strain (CARDIA)    Difficulty of Paying Living Expenses: Not hard at all  Food Insecurity: No Food Insecurity (12/22/2021)   Hunger Vital Sign    Worried About Running Out of Food in the Last Year: Never true    Ran Out of Food in the Last Year: Never true  Transportation Needs: No Transportation Needs (12/22/2021)   PRAPARE - Hydrologist (Medical): No    Lack of Transportation (Non-Medical): No  Physical Activity: Insufficiently Active (12/22/2021)   Exercise Vital Sign    Days of Exercise per Week: 2 days    Minutes of Exercise per Session: 30 min  Stress: No Stress Concern Present (12/22/2021)   [place]    Feeling of Stress : Not at all  Social Connections: Moderately Integrated (12/22/2021)   Social Connection and Isolation Panel [NHANES]    Frequency of Communication with Friends and Family: Twice a week    Frequency of Social Gatherings with Friends and Family: More than three times a week    Attends Religious Services: More than 4 times per year    Active Member of [employer] or Organizations: No    Attends Archivist Meetings: Never    Marital Status: Married  Human resources officer Violence: Not At Risk (12/22/2021)   Humiliation, Afraid, Rape, and Kick questionnaire    Fear of Current or Ex-Partner: No    Emotionally Abused: No    Physically Abused: No    Sexually Abused: No    FAMILY HISTORY: Family History  Problem Relation Age of Onset   Emphysema Mother    Liver cancer Mother    Pancreatic cancer Mother    Anxiety disorder Mother     Cancer Mother    Hypertension Mother    Multiple myeloma Father    Cancer Father    Hypertension Father    Colon cancer Paternal Grandmother    Colon cancer Paternal Grandfather     ALLERGIES:  is allergic to shellfish allergy.  MEDICATIONS:  Current Outpatient Medications  Medication Sig Dispense Refill   albuterol (PROVENTIL) (2.5 MG/3ML) 0.083% nebulizer solution Take 3 mLs (2.5 mg total) by nebulization every 6 (six) hours as needed for wheezing or shortness of breath. 150 mL 1   albuterol (VENTOLIN HFA) 108 (90 Base) MCG/ACT inhaler Inhale 2 puffs into the lungs every 6 (six) hours as needed for wheezing or shortness of breath. 18 g 11   amLODipine (NORVASC) 10 MG tablet Take 1 tablet (10 mg  total) by mouth daily. 90 tablet 3   Ascorbic Acid (VITAMIN C PO) Take 1-2 tablets by mouth 2 (two) times daily. 549m     budesonide-formoterol (SYMBICORT) 160-4.5 MCG/ACT inhaler Inhale 2 puffs into the lungs 2 (two) times daily. 1 each 12   calcium carbonate (TUMS - DOSED IN MG ELEMENTAL CALCIUM) 500 MG chewable tablet Chew 1 tablet by mouth daily.     Calcium Carbonate-Vit D-Min (CALCIUM 1200 PO) Take 1 tablet by mouth daily.     Cholecalciferol (VITAMIN D PO) Take 1 tablet by mouth daily.     Cholecalciferol (VITAMIN D3) 50 MCG (2000 UT) TABS Take 1 tablet by mouth daily.     diclofenac Sodium (VOLTAREN) 1 % GEL Place onto the skin.     fluticasone (FLONASE) 50 MCG/ACT nasal spray Place into the Fox.     irbesartan (AVAPRO) 300 MG tablet Take 1 tablet (300 mg total) by mouth daily. 90 tablet 1   Multiple Vitamin (MULTIVITAMIN) tablet Take 1 tablet by mouth daily.     Omega-3 Fatty Acids (FISH OIL) 1200 MG CAPS Take 1,200 mg by mouth in the morning and at bedtime.     pregabalin (LYRICA) 50 MG capsule Take 2 capsules (100 mg total) by mouth at bedtime. 60 capsule 5   Respiratory Therapy Supplies MISC Filters Hoses tubing masks headgear cleaning supplies for dreamwear and  philips  respironics bipap machine. 2 each 0   sildenafil (REVATIO) 20 MG tablet      Spacer/Aero-Holding Chambers (AEROCHAMBER MV) inhaler Use as directed with albuterol inhaler. 1 each 0   TURMERIC PO Take 1,000 mg by mouth in the morning and at bedtime.     vitamin B-12 (CYANOCOBALAMIN) 500 MCG tablet Take 1 tablet by mouth daily.     zolpidem (AMBIEN) 10 MG tablet Take 1 tablet (10 mg total) by mouth at bedtime as needed for sleep. 90 tablet 1   No current facility-administered medications for this visit.    REVIEW OF SYSTEMS:    10 Point review of Systems was done is negative except as noted above.  PHYSICAL EXAMINATION: Telemedicine visit  LABORATORY DATA:  I have reviewed the data as listed .    Latest Ref Rng & Units 04/05/2022   12:17 PM 03/29/2022   11:14 AM 12/01/2021   11:11 AM  CBC  WBC 4.0 - 10.5 K/uL 5.8  6.6  5.0   Hemoglobin 13.0 - 17.0 g/dL 14.7  14.5  13.6   Hematocrit 39.0 - 52.0 % 43.7  43.3  40.7   Platelets 150 - 400 K/uL 220  226.0  198.0    .    Latest Ref Rng & Units 04/05/2022   12:17 PM 03/29/2022   11:14 AM 12/02/2021   10:57 AM  CMP  Glucose 70 - 99 mg/dL 110  111    BUN 8 - 23 mg/dL 19  20    Creatinine 0.61 - 1.24 mg/dL 0.97  0.98    Sodium 135 - 145 mmol/L 140  143    Potassium 3.5 - 5.1 mmol/L 4.2  4.7    Chloride 98 - 111 mmol/L 106  105    CO2 22 - 32 mmol/L 28  30    Calcium 8.9 - 10.3 mg/dL 10.2  10.1    Total Protein 6.5 - 8.1 g/dL 7.0  6.8  6.8   Total Bilirubin 0.3 - 1.2 mg/dL 0.5  0.5    Alkaline Phos 38 - 126 U/L 69  76    AST 15 - 41 U/L 15  15    ALT 0 - 44 U/L 20  19     Component     Latest Ref Rng 04/05/2022 04/07/2022  IgG (Immunoglobin G), Serum     603 - 1,613 mg/dL 787    IgA     61 - 437 mg/dL 149    IgM (Immunoglobulin M), Srm     20 - 172 mg/dL 88    Total Protein ELP     6.0 - 8.5 g/dL 6.6 (C)   Albumin SerPl Elph-Mcnc     2.9 - 4.4 g/dL 3.8 (C)   Alpha 1     0.0 - 0.4 g/dL 0.2 (C)   Alpha2 Glob SerPl Elph-Mcnc      0.4 - 1.0 g/dL 0.7 (C)   B-Globulin SerPl Elph-Mcnc     0.7 - 1.3 g/dL 1.0 (C)   Gamma Glob SerPl Elph-Mcnc     0.4 - 1.8 g/dL 0.9 (C)   M Protein SerPl Elph-Mcnc     Not Observed g/dL Not Observed (C)   Globulin, Total     2.2 - 3.9 g/dL 2.8 (C)   Albumin/Glob SerPl     0.7 - 1.7  1.4 (C)   IFE 1 Comment (C)   Please Note (HCV): Comment (C)   Total Protein, Urine-UPE24     Not Estab. mg/dL  7.9   Total Protein, Urine-Ur/day     30 - 150 mg/24 hr  122   ALBUMIN, U     %  50.3   ALPHA 1 URINE     %  9.4   Alpha 2, Urine     %  11.9   % BETA, Urine     %  23.1   GAMMA GLOBULIN URINE     %  5.4   Free Kappa Lt Chains,Ur     1.17 - 86.46 mg/L  12.36   Free Lambda Lt Chains,Ur     0.27 - 15.21 mg/L  1.76 (C)  Free Kappa/Lambda Ratio     1.83 - 14.26   7.02 (C)  Immunofixation Result, Urine  Comment (C)  Total Volume  1,550   M-SPIKE %, Urine     Not Observed %  Not Observed (C)  NOTE:  Comment (C)  Kappa free light chain     3.3 - 19.4 mg/L 18.5    Lambda free light chains     5.7 - 26.3 mg/L 14.1    Kappa, lambda light chain ratio     0.26 - 1.65  1.31    PSA     0.10 - 4.00 ng/mL    LDH     98 - 192 U/L 131    Beta-2 Microglobulin     0.6 - 2.4 mg/L 1.5      Legend: (C) Corrected  RADIOGRAPHIC STUDIES: I have personally reviewed the radiological images as listed and agreed with the findings in the report. CT Abdomen Pelvis W Contrast  Result Date: 03/30/2022 CLINICAL DATA:  Abdominal pain for 1 month which radiates to the left. Irregular bowel movements. History of prostate cancer and renal cell carcinoma. EXAM: CT ABDOMEN AND PELVIS WITH CONTRAST TECHNIQUE: Multidetector CT imaging of the abdomen and pelvis was performed using the standard protocol following bolus administration of intravenous contrast. RADIATION DOSE REDUCTION: This exam was performed according to the departmental dose-optimization program which includes automated exposure control,  adjustment of the mA and/or kV  according to patient size and/or use of iterative reconstruction technique. CONTRAST:  45m OMNIPAQUE IOHEXOL 300 MG/ML  SOLN COMPARISON:  11/13/2021 FINDINGS: Lower chest: No pleural fluid or airspace disease. Scarring within the right middle lobe and lingula is again seen. Hepatobiliary: Small hypodense lesion within segment 8/4 is unchanged measuring 5 mm. No suspicious liver lesions identified on today's study. Gallbladder appears normal. No bile duct dilatation. Pancreas: Unremarkable. No pancreatic ductal dilatation or surrounding inflammatory changes. Spleen: Normal in size without focal abnormality. Adrenals/Urinary Tract: Normal adrenal glands. Stable postoperative change from partial nephrectomy off the upper pole of the left kidney. Bilateral kidney cysts are again seen in appears stable in the interval. The largest arises off the medial cortex of the left kidney measuring 3.3 cm. No nephrolithiasis or hydronephrosis. Urinary bladder appears normal. Stomach/Bowel: Stomach is within normal limits. The appendix is visualized and appears normal. Sigmoid diverticulosis without signs of acute diverticulitis. No bowel wall thickening or inflammation. No pathologic dilatation of the large or small bowel loops. Vascular/Lymphatic: Normal appearance of the abdominal aorta. No enlarged abdominopelvic adenopathy. Reproductive: Status post prostatectomy. Other: No free fluid or fluid collections. Small fat containing umbilical hernia. No signs of pneumoperitoneum. Musculoskeletal: Mild multilevel lumbar spondylosis. No acute or suspicious osseous findings. IMPRESSION: 1. No acute findings within the abdomen or pelvis. 2. Sigmoid diverticulosis without signs of acute diverticulitis. 3. Status post partial nephrectomy off the upper pole of the left kidney. No signs of recurrent or metastatic disease. 4. Status post prostatectomy. Electronically Signed   By: TKerby MoorsM.D.   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 CBC and CMP are normal. Kappa/lambda light chain is in the normal range.  24-hr urine test is normal.  Multiple myeloma panel does not show M-protein.  Beta-2 Microglobulin is normal and  LDH is in the normal range.  -Patient does not have any abnormal M -protein. No indication of presence of plasma cell dyscrasia at this time. -Continue to follow-up with neurologist and PCP. -See [place].   FOLLOW-UP: Rtc with pcp  The total time spent in the appointment was 10 minutes* .  All of the patient's questions were answered with apparent satisfaction. The patient knows to call the clinic with any problems, questions or concerns.   GSullivan LoneMD MS AAHIVMS SWayne Unc HealthcareCEncompass Health Rehabilitation Hospital Of TallahasseeHematology/Oncology Physician CC[facility] .*Total Encounter Time as defined by the Centers for Medicare and Medicaid Services includes, in addition to the face-to-face time of a patient visit (documented in the note above) non-face-to-face time: obtaining and reviewing outside history, ordering and reviewing medications, tests or procedures, care coordination (communications with other health care professionals or caregivers) and documentation in the medical record.   I, PCleda Mccreedy am acting as a sEducation administratorfor GSullivan Lone MD. .I have reviewed the above documentation for accuracy and completeness, and I agree with the above. .Brunetta GeneraMD

## 2022-05-03 DIAGNOSIS — M1711 Unilateral primary osteoarthritis, right knee: Secondary | ICD-10-CM | POA: Diagnosis not present

## 2022-05-10 DIAGNOSIS — M1711 Unilateral primary osteoarthritis, right knee: Secondary | ICD-10-CM | POA: Diagnosis not present

## 2022-05-13 DIAGNOSIS — M9903 Segmental and somatic dysfunction of lumbar region: Secondary | ICD-10-CM | POA: Diagnosis not present

## 2022-05-13 DIAGNOSIS — M5136 Other intervertebral disc degeneration, lumbar region: Secondary | ICD-10-CM | POA: Diagnosis not present

## 2022-05-14 DIAGNOSIS — C61 Malignant neoplasm of prostate: Secondary | ICD-10-CM | POA: Diagnosis not present

## 2022-05-26 DIAGNOSIS — C61 Malignant neoplasm of prostate: Secondary | ICD-10-CM | POA: Diagnosis not present

## 2022-05-26 DIAGNOSIS — N393 Stress incontinence (female) (male): Secondary | ICD-10-CM | POA: Diagnosis not present

## 2022-05-26 DIAGNOSIS — C642 Malignant neoplasm of left kidney, except renal pelvis: Secondary | ICD-10-CM | POA: Diagnosis not present

## 2022-05-28 DIAGNOSIS — K080 Exfoliation of teeth due to systemic causes: Secondary | ICD-10-CM | POA: Diagnosis not present

## 2022-05-31 ENCOUNTER — Encounter: Payer: Self-pay | Admitting: Internal Medicine

## 2022-05-31 ENCOUNTER — Ambulatory Visit (INDEPENDENT_AMBULATORY_CARE_PROVIDER_SITE_OTHER): Payer: Medicare Other | Admitting: Internal Medicine

## 2022-05-31 VITALS — BP 130/64 | HR 64 | Temp 98.1°F | Ht 69.5 in | Wt 185.8 lb

## 2022-05-31 DIAGNOSIS — M7989 Other specified soft tissue disorders: Secondary | ICD-10-CM

## 2022-05-31 DIAGNOSIS — G4700 Insomnia, unspecified: Secondary | ICD-10-CM | POA: Diagnosis not present

## 2022-05-31 DIAGNOSIS — Z79899 Other long term (current) drug therapy: Secondary | ICD-10-CM | POA: Diagnosis not present

## 2022-05-31 DIAGNOSIS — K5730 Diverticulosis of large intestine without perforation or abscess without bleeding: Secondary | ICD-10-CM | POA: Insufficient documentation

## 2022-05-31 DIAGNOSIS — Z1211 Encounter for screening for malignant neoplasm of colon: Secondary | ICD-10-CM | POA: Diagnosis not present

## 2022-05-31 DIAGNOSIS — R439 Unspecified disturbances of smell and taste: Secondary | ICD-10-CM

## 2022-05-31 HISTORY — DX: Unspecified disturbances of smell and taste: R43.9

## 2022-05-31 MED ORDER — ZOLPIDEM TARTRATE 10 MG PO TABS: 10.0000 mg | ORAL_TABLET | Freq: Every evening | ORAL | 1 refills | Status: DC | PRN

## 2022-05-31 MED ORDER — MIRTAZAPINE 7.5 MG PO TABS: 7.5000 mg | ORAL_TABLET | Freq: Every day | ORAL | 3 refills | Status: DC

## 2022-05-31 NOTE — Assessment & Plan Note (Addendum)
Advised patient to use SimplySaline rinses daily  Call for ENT referral if it doesn't resolve. If worsening, willing to do CT sinus  Offered mri brain due to someone else thought it might be a brain tumor- he declined for now

## 2022-05-31 NOTE — Progress Notes (Signed)
[facility]: [id number]   Routine Medical Office Visit  Patient:  Jay Fox      Age: 72 y.o.       Sex:  male  Date:   05/31/2022  PCP:    Loralee Pacas, [place] Provider: Loralee Pacas, MD   Assessment and Plan:   Has all medications except Ambien so most of visit was spent trying to adjust need for Ambien - also he was concerned about a few years of smelling cigarettes that aren't there but declined workup and will assume its from sinus surgery nerve dmg  Kevontae was seen today for 6 month follow-up.  Insomnia, unspecified type Overview: Historically has taken  - Klonopin but stopped - Ambien and ambien CR worked initially and more recently hasn't worked but hasn't been able to even try tapering due to stressful events - 2 sleep studies advised patient to get CPAP but no help with difficulty falling asleep - A/w ?restless legs; referred to neurology / sleep who is assisting/consulting on the proble Chronic poorly controlled, associated with movement disorder Previously discussed risks of dementia, motor vehicle collision, and how benefits may not outweigh.      Despite these risks, he still feels needs to continue it - see informed consent 05/31/22, and controlled substance contract. Patient reports failed valerian root, melatonin, lunesta.   From 2023 records: [place]:  Reported degree of insomnia: severe Insomnia episode began: more than 1 month ago Insomnia episode progress: unchanged Sleep patterns during the week:    Circadian rhythm: evening type    Routine before bed: bath/shower, teeth brushing, changing/removing clothes, computer, meditation/prayer, snack, takes medication and watch TV     Bedroom environment: cold, comfortable and dark     Time to fall asleep: 1 hour   Number of nighttime awakenings: one to two   Difficulty going back to sleep: Yes     Activity when awakened: uses the bathroom      Duration of awakening: 5 minutes   Typical morning wake-up time: 07:00 EDT   Typical time to rise from bed: 08:00 EDT   Premature morning awakening: Yes     Difficult to arouse in the morning: No     Working off-shift hours: No     Sleep aids used: alcohol, hypnotics, prescription drugs and sedatives   Frequency of use: nightly Napping:    Naps during the day: No   Evidence of other sleep disorders:    Obstructive sleep apnea: Yes     Restless leg syndrome: Yes     Disturbing dreams: No     Night terrors: No     Somnambulism: No   Substances that might interfere with sleep:    Caffeine: No     Nicotine: No     Alcohol: Yes     Other substances: No   Other factors that might interfere with sleep:    Irregular sleep schedule: No     Environmental sleep factors: pets    Nocturnal eating: no unhealthy nocturnal eating practices     Exercise timing: not close to bedtime      Orders: -     Zolpidem Tartrate; Take 1 tablet (10 mg total) by mouth at bedtime as needed for sleep. Can NOT take with opioid pain medicine, alcohol, or other sedative.  Risk dementia/mornings impaired alertness  Dispense: 90 tablet; Refill: 1 -     Mirtazapine; Take 1 tablet (7.5 mg total)  by mouth at bedtime. Use instead of Ambien if possible, ok to put Ambien on top of.  Dispense: 90 tablet; Refill: 3  Colon cancer screening -     Cologuard  High risk medication use Overview: Ambien for insomnia and pregabalin for restless legs syndrome  Indication for controlled substance: insomnia Medication and dose: see associated problem and med list # pills per month: 30 Controlled substance contract signed (Y/N): Yes, scanned , 12/14/21 PDMP last reviewed (include red flags): PDMP reviewed during this encounter.  UDS: 12/14/21 negative Diversion Prevention Plan:  I have warned that I cannot continue to prescribe if there legal or substance misuse problems are discovered, we will review signed contract  intermittently, and we will do intermittent random drug screens and pill counts.  Asked patient to bring bottles with unused medication to appointment to demonstrate nothing is being diverted.     Assessment & Plan: During our consultation on 05/31/2022, while exploring treatment options that align with his values/preferences, we had a detailed discussion of the risks of using Ambien for difficulty getting to sleep.  To facilitate shared decision-making, I used motivational interviewing, but respected his autonomy for self-directed care while adhering to professional standards and ethical principles.  I ensured: his understanding of the additional risks of the non-recommended approach by asking clarifying questions and summarizing key points. his capacity to evaluate these risks and decide for himself - he demonstrated the ability to understand information, appreciate consequences, and make rational decisions today. Despite my recommendations, he continued to prefer his chosen treatment of long term Ambien. Following these discussions, I agreed to his preferred approach, after repeating my concerns about the risks.  I explained that I would document this discussion in his chart in lieu of written/signed informed consent due to the low-risk nature of the intervention.  he verbally agreed to this.  This document will be immediately and permanently available on his chart for review, allowing him to contest or clarify any element of this documented consent through a MyChart message.     Leg swelling Overview: Noticed early 2023 with ferritin deposition.  Seen by vein specialist, the said valves not working.mainly bothers when legs in reclining  Assessment & Plan: Defers fluid pill, due to urinary incontinence     Altered olfactory perception Overview: Smells cigarettes sometimes  Could be neuropathy related- sinuses cleaned out 2019   Assessment & Plan: Advised patient to use SimplySaline  rinses daily  Call for ENT referral if it doesn't resolve. If worsening, willing to do CT sinus  Offered mri brain due to someone else thought it might be a brain tumor- he declined for now         Clinical Presentation:   The patient is a 72 y.o. male 6 month follow-up   72 y.o. male 6 month follow-up    has Asthma; Hypertension; Seasonal allergies; Urinary incontinence; Skin lesions, generalized; Prostate cancer ([place]); Insomnia; Heart palpitations; Benign essential hypertension; Asthma, moderate persistent; History of renal cell carcinoma; History of prostate cancer; History of radical prostatectomy; Leg swelling; Restless leg syndrome; Prediabetes; OSA (obstructive sleep apnea); Back wound; Generalized abdominal pain; High risk medication use; Bloating; and Altered olfactory perception on their problem list.  has a past medical history of Allergy (1953), Altered olfactory perception (05/31/2022), Anxiety, Arthritis, Asthma, BPH (benign prostatic hypertrophy), BPH (benign prostatic hypertrophy), Cancer ([place]), Cataract, Chronic kidney disease, Depression, GERD (gastroesophageal reflux disease), History of kidney cancer, History of prostate cancer (11/30/2021), Hypertension, Leg swelling (11/30/2021), OSA (  obstructive sleep apnea) (11/30/2021), Restless legs (11/30/2021), Seasonal allergies, and Sleep apnea.  Current Outpatient Medications:    albuterol (PROVENTIL) (2.5 MG/3ML) 0.083% nebulizer solution, Take 3 mLs (2.5 mg total) by nebulization every 6 (six) hours as needed for wheezing or shortness of breath., Disp: 150 mL, Rfl: 1   albuterol (VENTOLIN HFA) 108 (90 Base) MCG/ACT inhaler, Inhale 2 puffs into the lungs every 6 (six) hours as needed for wheezing or shortness of breath., Disp: 18 g, Rfl: 11   amLODipine (NORVASC) 10 MG tablet, Take 1 tablet (10 mg total) by mouth daily., Disp: 90 tablet, Rfl: 3   Ascorbic Acid (VITAMIN C PO), Take 1-2 tablets by mouth 2 (two) times daily.  500mg , Disp: , Rfl:    budesonide-formoterol (SYMBICORT) 160-4.5 MCG/ACT inhaler, Inhale 2 puffs into the lungs 2 (two) times daily., Disp: 1 each, Rfl: 12   calcium carbonate (SUPER CALCIUM) 1500 (600 Ca) MG TABS tablet, 1 tablet with meals Orally Twice a day for 30 day(s), Disp: , Rfl:    calcium carbonate (TUMS - DOSED IN MG ELEMENTAL CALCIUM) 500 MG chewable tablet, Chew 1 tablet by mouth daily., Disp: , Rfl:    Calcium Carbonate-Vit D-Min (CALCIUM 1200 PO), Take 1 tablet by mouth daily., Disp: , Rfl:    Cholecalciferol (VITAMIN D3) 50 MCG (2000 UT) TABS, Take 1 tablet by mouth daily., Disp: , Rfl:    diclofenac Sodium (VOLTAREN) 1 % GEL, Place onto the skin., Disp: , Rfl:    fluticasone (FLONASE) 50 MCG/ACT nasal spray, Place into the nose., Disp: , Rfl:    irbesartan (AVAPRO) 300 MG tablet, Take 1 tablet (300 mg total) by mouth daily., Disp: 90 tablet, Rfl: 1   mirtazapine (REMERON) 7.5 MG tablet, Take 1 tablet (7.5 mg total) by mouth at bedtime. Use instead of Ambien if possible, ok to put Ambien on top of., Disp: 90 tablet, Rfl: 3   Multiple Vitamin (MULTIVITAMIN) tablet, Take 1 tablet by mouth daily., Disp: , Rfl:    Omega-3 Fatty Acids (FISH OIL) 1200 MG CAPS, Take 1,200 mg by mouth in the morning and at bedtime., Disp: , Rfl:    pregabalin (LYRICA) 50 MG capsule, Take 2 capsules (100 mg total) by mouth at bedtime., Disp: 60 capsule, Rfl: 5   Respiratory Therapy Supplies MISC, Filters Hoses tubing masks headgear cleaning supplies for dreamwear and  philips respironics bipap machine., Disp: 2 each, Rfl: 0   sildenafil (REVATIO) 20 MG tablet, , Disp: , Rfl:    Spacer/Aero-Holding Chambers (AEROCHAMBER MV) inhaler, Use as directed with albuterol inhaler., Disp: 1 each, Rfl: 0   TURMERIC PO, Take 1,000 mg by mouth in the morning and at bedtime., Disp: , Rfl:    vitamin B-12 (CYANOCOBALAMIN) 500 MCG tablet, Take 1 tablet by mouth daily., Disp: , Rfl:    Cholecalciferol (VITAMIN D PO), Take 1  tablet by mouth daily. (Patient not taking: Reported on 05/31/2022), Disp: , Rfl:    zolpidem (AMBIEN) 10 MG tablet, Take 1 tablet (10 mg total) by mouth at bedtime as needed for sleep. Can NOT take with opioid pain medicine, alcohol, or other sedative.  Risk dementia/mornings impaired alertness, Disp: 90 tablet, Rfl: 1  Active Ambulatory Problems    Diagnosis Date Noted   Asthma    Hypertension    Seasonal allergies    Urinary incontinence 05/15/2012   Skin lesions, generalized 05/15/2012   Prostate cancer ([place]) 11/21/2017   Insomnia 09/08/2012   Heart palpitations 04/12/2011   Benign  essential hypertension 04/12/2011   Asthma, moderate persistent 04/12/2011   History of renal cell carcinoma 02/22/2019   History of prostate cancer 11/30/2021   History of radical prostatectomy 11/30/2021   Leg swelling 11/30/2021   Restless leg syndrome 11/30/2021   Prediabetes 11/30/2021   OSA (obstructive sleep apnea) 11/30/2021   Back wound 03/29/2022   Generalized abdominal pain 03/29/2022   High risk medication use 03/29/2022   Bloating 03/29/2022   Altered olfactory perception 05/31/2022   Resolved Ambulatory Problems    Diagnosis Date Noted   BPH (benign prostatic hypertrophy)    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   albuterol (VENTOLIN HFA) 108 (90 Base) MCG/ACT inhaler Inhale 2 puffs into the lungs every 6 (six) hours as needed for wheezing or shortness of breath.   amLODipine (NORVASC) 10  MG tablet Take 1 tablet (10 mg total) by mouth daily.   Ascorbic Acid (VITAMIN C PO) Take 1-2 tablets by mouth 2 (two) times daily. 500mg    budesonide-formoterol (SYMBICORT) 160-4.5 MCG/ACT inhaler Inhale 2 puffs into the lungs 2 (two) times daily.   calcium carbonate (SUPER CALCIUM) 1500 (600 Ca) MG TABS tablet 1 tablet with meals Orally Twice a day for 30 day(s)   calcium carbonate (TUMS - DOSED IN MG ELEMENTAL CALCIUM) 500 MG chewable tablet Chew 1 tablet by mouth daily.   Calcium Carbonate-Vit D-Min (CALCIUM 1200 PO) Take 1 tablet by mouth daily.   Cholecalciferol (VITAMIN D3) 50 MCG (2000 UT) TABS Take 1 tablet by mouth daily.   diclofenac Sodium (VOLTAREN) 1 % GEL Place onto the skin.   fluticasone (FLONASE) 50 MCG/ACT nasal spray Place into the nose.   irbesartan (AVAPRO) 300 MG tablet Take 1 tablet (300 mg total) by mouth daily.   Multiple Vitamin (MULTIVITAMIN) tablet Take 1 tablet by mouth daily.   Omega-3 Fatty Acids (FISH OIL) 1200 MG CAPS Take 1,200 mg by mouth in the morning and at bedtime.   pregabalin (LYRICA) 50 MG capsule Take 2 capsules (100 mg total) by mouth at bedtime.   Respiratory Therapy Supplies MISC Filters Hoses tubing masks headgear cleaning supplies for dreamwear and  philips respironics bipap machine.   sildenafil (REVATIO) 20 MG tablet    Spacer/Aero-Holding Chambers (AEROCHAMBER MV) inhaler Use as directed with albuterol inhaler.   TURMERIC PO Take 1,000 mg by mouth in the morning and at bedtime.   vitamin B-12 (CYANOCOBALAMIN) 500 MCG tablet Take 1 tablet by mouth daily.   [DISCONTINUED] zolpidem (AMBIEN) 10 MG tablet Take 1 tablet (10 mg total) by mouth at bedtime as needed for sleep.   Cholecalciferol (VITAMIN D PO) Take 1 tablet by mouth daily. (Patient not taking: Reported on 05/31/2022)   No facility-administered medications prior to visit.     HPI           Clinical Data Analysis:  Physical Exam  BP 130/64 (BP Location: Left Arm, Patient  Position: Sitting)   Pulse 64   Temp 98.1 F (36.7 C) (Temporal)   Ht 5' 9.5" (1.765 m)   Wt 185 lb 12.8 oz (84.3 kg)   SpO2 95%   BMI 27.04 kg/m  Wt Readings from Last 10 Encounters:  05/31/22 185 lb 12.8 oz (84.3 kg)  03/29/22 182 lb (82.6 kg)  03/03/22 179 lb 3.2 oz (81.3 kg)  01/04/22 177 lb (80.3 kg)  12/22/21 176 lb (79.8 kg)  12/14/21 176 lb 3.2 oz (79.9 kg)  12/11/21 178 lb 9.6 oz (81 kg)  12/02/21 181 lb (82.1 kg)  11/30/21 179 lb (81.2 kg)  07/14/21 183 lb 13.8 oz (83.4 kg)   Vital signs reviewed.  Nursing notes reviewed. Weight trend reviewed. Abnormalities and Problem-Specific physical exam findings:  Body mass index is 27.04 kg/m.  General Appearance:  Well developed, well nourished .vitalheight  male with Body mass index is 27.04 kg/m. in no acute distress.   Pulmonary:  Normal work of breathing at rest, no respiratory distress apparent. SpO2: 95 %  Musculoskeletal: All extremities are intact.  Neurological:  Awake, alert. No obvious focal neurological deficits or cognitive impairments.  Sensorium seems unclouded. Psychiatric:  Appropriate mood, pleasant demeanor    Results Reviewed: (reviewed labs/imaging may be also be found in the assessment / plan section):  Recent Results (from the past 2160 hour(s))  Immunofixation electrophoresis     Status: None   Collection Time: 03/03/22  9:33 AM  Result Value Ref Range   Immunofix Electr Int      Comment: . A faint IgG (lambda) monoclonal immunoglobulin is detected. .    Immunoglobulin A 151 70 - 320 mg/dL   IgG (Immunoglobin G), Serum 836 600 - 1,540 mg/dL   IgM, Serum 92 50 - 300 mg/dL  Amylase     Status: None   Collection Time: 03/29/22 11:14 AM  Result Value Ref Range   Amylase 50 27 - 131 U/L  Lipase     Status: None   Collection Time: 03/29/22 11:14 AM  Result Value Ref Range   Lipase 20.0 11.0 - 59.0 U/L  Comprehensive metabolic panel     Status: Abnormal   Collection Time: 03/29/22 11:14 AM   Result Value Ref Range   Sodium 143 135 - 145 mEq/L   Potassium 4.7 3.5 - 5.1 mEq/L   Chloride 105 96 - 112 mEq/L   CO2 30 19 - 32 mEq/L   Glucose, Bld 111 (H) 70 - 99 mg/dL   BUN 20 6 - 23 mg/dL   Creatinine, Ser 0.98 0.40 - 1.50 mg/dL   Total Bilirubin 0.5 0.2 - 1.2 mg/dL   Alkaline Phosphatase 76 39 - 117 U/L   AST 15 0 - 37 U/L   ALT 19 0 - 53 U/L   Total Protein 6.8 6.0 - 8.3 g/dL   Albumin 4.5 3.5 - 5.2 g/dL   GFR 78.01 >60.00 mL/min    Comment: Calculated using the CKD-EPI Creatinine Equation (2021)   Calcium 10.1 8.4 - 10.5 mg/dL  CBC     Status: None   Collection Time: 03/29/22 11:14 AM  Result Value Ref Range   WBC 6.6 4.0 - 10.5 K/uL   RBC 4.89 4.22 - 5.81 Mil/uL   Platelets 226.0 150.0 - 400.0 K/uL   Hemoglobin 14.5 13.0 - 17.0 g/dL   HCT 43.3 39.0 - 52.0 %   MCV 88.6 78.0 - 100.0 fl   MCHC 33.4 30.0 - 36.0 g/dL  RDW 13.6 11.5 - 15.5 %  PSA     Status: Abnormal   Collection Time: 03/29/22 11:14 AM  Result Value Ref Range   PSA 0.00 Repeated and verified X2. (L) 0.10 - 4.00 ng/mL    Comment: Test performed using Access Hybritech PSA Assay, a parmagnetic partical, chemiluminecent immunoassay.  Lactate dehydrogenase     Status: None   Collection Time: 04/05/22 12:11 PM  Result Value Ref Range   LDH 131 98 - 192 U/L    Comment: Performed at [facility], [place] [address], [place], [place] [address]  Multiple Myeloma Panel (SPEP&IFE w/QIG)     Status: None   Collection Time: 04/05/22 12:11 PM  Result Value Ref Range   IgG (Immunoglobin G), Serum 787 603 - 1,613 mg/dL   IgA 149 61 - 437 mg/dL   IgM (Immunoglobulin M), Srm 88 20 - 172 mg/dL   Total Protein ELP 6.6 6.0 - 8.5 g/dL   Albumin SerPl Elph-Mcnc 3.8 2.9 - 4.4 g/dL   Alpha 1 0.2 0.0 - 0.4 g/dL   Alpha2 Glob SerPl Elph-Mcnc 0.7 0.4 - 1.0 g/dL   B-Globulin SerPl Elph-Mcnc 1.0 0.7 - 1.3 g/dL   Gamma Glob SerPl Elph-Mcnc 0.9 0.4 - 1.8 g/dL   M Protein SerPl Elph-Mcnc Not Observed  Not Observed g/dL   Globulin, Total 2.8 2.2 - 3.9 g/dL   Albumin/Glob SerPl 1.4 0.7 - 1.7   IFE 1 Comment     Comment: (NOTE) The immunofixation pattern appears unremarkable. Evidence of monoclonal protein is not apparent.    Please Note Comment     Comment: (NOTE) Protein electrophoresis scan will follow via computer, mail, or courier delivery. Performed At: [facility] [place], [place] [id number] Rush Farmer MD [id number]   Beta 2 microglobulin     Status: None   Collection Time: 04/05/22 12:17 PM  Result Value Ref Range   Beta-2 Microglobulin 1.5 0.6 - 2.4 mg/L    Comment: (NOTE) Siemens Immulite 2000 Immunochemiluminometric assay (ICMA) Values obtained with different assay methods or kits cannot be used interchangeably. Results cannot be interpreted as absolute evidence of the presence or absence of malignant disease. Performed At: [facility] [place], [place] [id number] Rush Farmer MD [id number]   Kappa/lambda light chains     Status: None   Collection Time: 04/05/22 12:17 PM  Result Value Ref Range   Kappa free light chain 18.5 3.3 - 19.4 mg/L   Lambda free light chains 14.1 5.7 - 26.3 mg/L   Kappa, lambda light chain ratio 1.31 0.26 - 1.65    Comment: (NOTE) Performed At: [facility] [place] [id number] Rush Farmer MD [id number]   [place] ([place] only)     Status: Abnormal   Collection Time: 04/05/22 12:17 PM  Result Value Ref Range   Sodium 140 135 - 145 mmol/L   Potassium 4.2 3.5 - 5.1 mmol/L   Chloride 106 98 - 111 mmol/L   CO2 28 22 - 32 mmol/L   Glucose, Bld 110 (H) 70 - 99 mg/dL    Comment: Glucose reference range applies only to samples taken after fasting for at least 8 hours.   BUN 19 8 - 23 mg/dL   Creatinine 0.97 0.61 - 1.24 mg/dL   Calcium 10.2 8.9 - 10.3 mg/dL   Total Protein 7.0 6.5 - 8.1 g/dL   Albumin 4.4 3.5 - 5.0 g/dL   AST 15 15 -  41 U/L    ALT 20 0 - 44 U/L   Alkaline Phosphatase 69 38 - 126 U/L   Total Bilirubin 0.5 0.3 - 1.2 mg/dL   GFR, Estimated >60 >60 mL/min    Comment: (NOTE) Calculated using the CKD-EPI Creatinine Equation (2021)    Anion gap 6 5 - 15    Comment: Performed at [facility], [place] [address] [place], [place] [address]  CBC with Differential ([place] Only)     Status: None   Collection Time: 04/05/22 12:17 PM  Result Value Ref Range   WBC Count 5.8 4.0 - 10.5 K/uL   RBC 4.96 4.22 - 5.81 MIL/uL   Hemoglobin 14.7 13.0 - 17.0 g/dL   HCT 43.7 39.0 - 52.0 %   MCV 88.1 80.0 - 100.0 fL   MCH 29.6 26.0 - 34.0 pg   MCHC 33.6 30.0 - 36.0 g/dL   RDW 13.1 11.5 - 15.5 %   Platelet Count 220 150 - 400 K/uL   nRBC 0.0 0.0 - 0.2 %   Neutrophils Relative % 62 %   Neutro Abs 3.6 1.7 - 7.7 K/uL   Lymphocytes Relative 20 %   Lymphs Abs 1.1 0.7 - 4.0 K/uL   Monocytes Relative 11 %   Monocytes Absolute 0.6 0.1 - 1.0 K/uL   Eosinophils Relative 6 %   Eosinophils Absolute 0.4 0.0 - 0.5 K/uL   Basophils Relative 1 %   Basophils Absolute 0.1 0.0 - 0.1 K/uL   Immature Granulocytes 0 %   Abs Immature Granulocytes 0.02 0.00 - 0.07 K/uL    Comment: Performed at [facility], [place] [address] [place], [place] [address]  24-Hr Ur UPEP/UIFE/Light Chains/TP     Status: None   Collection Time: 04/07/22 11:17 AM  Result Value Ref Range   Total Protein, Urine 7.9 Not Estab. mg/dL   Total Protein, Urine-Ur/day 122 30 - 150 mg/24 hr   Albumin, U 50.3 %   ALPHA 1 URINE 9.4 %   Alpha 2, Urine 11.9 %   % BETA, Urine 23.1 %   GAMMA GLOBULIN URINE 5.4 %   Free Kappa Lt Chains,Ur 12.36 1.17 - 86.46 mg/L   Free Lambda Lt Chains,Ur 1.76 0.27 - 15.21 mg/L   Free Kappa/Lambda Ratio 7.02 1.83 - 14.26    Comment: (NOTE) Performed At: [facility] [place], [place] [id number] Rush Farmer MD [id number]    Immunofixation Result, Urine Comment      Comment: (NOTE) The immunofixation pattern appears unremarkable. Evidence of monoclonal protein is not apparent.    Total Volume 1,550     Comment: Performed at [facility], [place] [address], [place], [place] [address]   M-SPIKE %, Urine Not Observed Not Observed %   Note: Comment     Comment: (NOTE) Protein electrophoresis scan will follow via computer, mail, or courier delivery.        Signed: Loralee Pacas, MD 05/31/2022 9:07 PM

## 2022-05-31 NOTE — Assessment & Plan Note (Signed)
During our consultation on 05/31/2022, while exploring treatment options that align with his values/preferences, we had a detailed discussion of the risks of using Ambien for difficulty getting to sleep.  To facilitate shared decision-making, I used motivational interviewing, but respected his autonomy for self-directed care while adhering to professional standards and ethical principles.  I ensured: his understanding of the additional risks of the non-recommended approach by asking clarifying questions and summarizing key points. his capacity to evaluate these risks and decide for himself - he demonstrated the ability to understand information, appreciate consequences, and make rational decisions today. Despite my recommendations, he continued to prefer his chosen treatment of long term Ambien. Following these discussions, I agreed to his preferred approach, after repeating my concerns about the risks.  I explained that I would document this discussion in his chart in lieu of written/signed informed consent due to the low-risk nature of the intervention.  he verbally agreed to this.  This document will be immediately and permanently available on his chart for review, allowing him to contest or clarify any element of this documented consent through a MyChart message.

## 2022-05-31 NOTE — Assessment & Plan Note (Signed)
Defers fluid pill, due to urinary incontinence

## 2022-05-31 NOTE — Assessment & Plan Note (Deleted)
During our consultation on 05/31/2022, while exploring treatment options that align with his values/preferences, we had a detailed discussion of the risks of using Ambien for difficulty getting to sleep.  To facilitate shared decision-making, I used motivational interviewing, but respected his autonomy for self-directed care while adhering to professional standards and ethical principles.  I ensured: his understanding of the additional risks of the non-recommended approach by asking clarifying questions and summarizing key points. his capacity to evaluate these risks and decide for himself - he demonstrated the ability to understand information, appreciate consequences, and make rational decisions today. Despite my recommendations, he continued to prefer his chosen treatment of long term Ambien. Following these discussions, I agreed to his preferred approach, after repeating my concerns about the risks.  I explained that I would document this discussion in his chart in lieu of written/signed informed consent due to the low-risk nature of the intervention.  he verbally agreed to this.  This document will be immediately and permanently available on his chart for review, allowing him to contest or clarify any element of this documented consent through a MyChart message.

## 2022-06-01 DIAGNOSIS — K080 Exfoliation of teeth due to systemic causes: Secondary | ICD-10-CM | POA: Diagnosis not present

## 2022-06-03 DIAGNOSIS — M5136 Other intervertebral disc degeneration, lumbar region: Secondary | ICD-10-CM | POA: Diagnosis not present

## 2022-06-03 DIAGNOSIS — M9903 Segmental and somatic dysfunction of lumbar region: Secondary | ICD-10-CM | POA: Diagnosis not present

## 2022-06-07 DIAGNOSIS — Z1211 Encounter for screening for malignant neoplasm of colon: Secondary | ICD-10-CM | POA: Diagnosis not present

## 2022-06-07 LAB — COLOGUARD: Cologuard: NEGATIVE

## 2022-06-11 LAB — COLOGUARD: COLOGUARD: NEGATIVE

## 2022-06-13 NOTE — Progress Notes (Signed)
Notify patient if not seen in myChart: Cologuard testing was Negative, this is great news.  My usual recommendation for future testing is: Cologuard testing once every 3 years from age 71 to 55 years unless you have a particularly concerning family history of colon cancer, but we can do it more frequently if you like.  Loralee Pacas, MD  06/13/2022 4:09 PM

## 2022-06-29 DIAGNOSIS — M5136 Other intervertebral disc degeneration, lumbar region: Secondary | ICD-10-CM | POA: Diagnosis not present

## 2022-06-29 DIAGNOSIS — M9903 Segmental and somatic dysfunction of lumbar region: Secondary | ICD-10-CM | POA: Diagnosis not present

## 2022-07-01 ENCOUNTER — Other Ambulatory Visit: Payer: Self-pay | Admitting: Neurology

## 2022-07-01 ENCOUNTER — Other Ambulatory Visit: Payer: Self-pay | Admitting: Internal Medicine

## 2022-07-01 DIAGNOSIS — I10 Essential (primary) hypertension: Secondary | ICD-10-CM

## 2022-07-01 DIAGNOSIS — R209 Unspecified disturbances of skin sensation: Secondary | ICD-10-CM

## 2022-07-01 DIAGNOSIS — G2581 Restless legs syndrome: Secondary | ICD-10-CM

## 2022-07-05 DIAGNOSIS — M5136 Other intervertebral disc degeneration, lumbar region: Secondary | ICD-10-CM | POA: Diagnosis not present

## 2022-07-05 DIAGNOSIS — M9903 Segmental and somatic dysfunction of lumbar region: Secondary | ICD-10-CM | POA: Diagnosis not present

## 2022-07-27 DIAGNOSIS — M9903 Segmental and somatic dysfunction of lumbar region: Secondary | ICD-10-CM | POA: Diagnosis not present

## 2022-07-27 DIAGNOSIS — M5136 Other intervertebral disc degeneration, lumbar region: Secondary | ICD-10-CM | POA: Diagnosis not present

## 2022-08-06 ENCOUNTER — Other Ambulatory Visit: Payer: Self-pay | Admitting: Neurology

## 2022-08-06 DIAGNOSIS — R209 Unspecified disturbances of skin sensation: Secondary | ICD-10-CM

## 2022-08-06 DIAGNOSIS — G2581 Restless legs syndrome: Secondary | ICD-10-CM

## 2022-08-12 DIAGNOSIS — M5136 Other intervertebral disc degeneration, lumbar region: Secondary | ICD-10-CM | POA: Diagnosis not present

## 2022-08-12 DIAGNOSIS — M9903 Segmental and somatic dysfunction of lumbar region: Secondary | ICD-10-CM | POA: Diagnosis not present

## 2022-08-12 DIAGNOSIS — H33002 Unspecified retinal detachment with retinal break, left eye: Secondary | ICD-10-CM | POA: Diagnosis not present

## 2022-08-12 DIAGNOSIS — H2513 Age-related nuclear cataract, bilateral: Secondary | ICD-10-CM | POA: Diagnosis not present

## 2022-08-12 DIAGNOSIS — H43813 Vitreous degeneration, bilateral: Secondary | ICD-10-CM | POA: Diagnosis not present

## 2022-08-27 NOTE — Progress Notes (Signed)
NEUROLOGY FOLLOW UP OFFICE NOTE  Jay Fox [id number]  Subjective:  Jay Fox is a 72 y.o. year old right-handed male with a medical history of HTN, prostate cancer, renal cell carcinoma, CKD, restless leg syndrome, chronic low back pain, OSA (on BiPAP), prediabetes, depression, anxiety who we last saw on 03/03/22.  To briefly review: Initial consult 12/02/21: Patient has had RLS for 10-12 years. He describes it as a creepy crawly feeling in both feet (on bottom). He feels the urge to move his feet. If he gets up and walks, the sensation goes away. Poor sleep makes his symptoms worse. He takes ropinirole 5 mg at 9:30 pm and if his symptoms return or does not work, he takes another 5 mg (usually will be 11pm to 12am). Most nights he now takes both 5 mg doses (10 mg total). He thinks he has been on the ropinirole for about 2 years. He was on gabapentin and an under the tongue medication then. Gabapentin did not help. He does not remember the dose. He was started on 2.5 mg at that time.   Patient was seen by his PCP, Dr. Jon Billings on 11/30/21 for his symptoms. Per the clinic note, patient has a 10-12 year history of RLS. His symptoms have been progressively worsening despite increasing doses of ropinirole. He was taking 10 mg daily at that clinic visit. It was recommended the patient wean off ropinirole at some point. Labs and MRI lumbar spine was ordered. He was prescribed on pramipexole 0.5 mg TID and rotigotine patch (3 mg) daily. He has not gotten either yet. One was covered by insurance and one was not (he does not remember which today). He was also told to stop citalopram. Patient also mentioned a vein issue that has popped up over the last 6 months. He will be seeing a vascular specialist for this. Patient was referred to neurology for further work up and management of RLS.   Do RLS symptoms appear earlier than when ropinirole was first started? Sometimes yes, but not all the time. Over  the last 6 months or so, he now gets the symptoms when taking a nap or sitting for long periods. Are higher doses now needed or do you need to take the medication earlier to control symptoms? Yes Has intensity of symptoms worsened since starting ropinirole? No change in intensity, but happening more. Have symptoms spread to other body parts since starting ropinirole? No   He denies numbness and tingling when not having RLS symptoms (during the day). He does have a history of plantar fasciitis (bilateral) and Morton's neuroma on right foot (early 2000s).   He describes poor sleep due to sleep apnea but also insomnia. He previously saw sleep medicine in [place], but has no one here. He is interested in establishing care.   Patient also mentions achy teeth on the left side of mouth since 2016. He had a burning mouth for a while. A neurologist in [place] put his on gabapentin, which did not help. This resolved, but he recently had caps put on and the tooth pain has returned. The burning mouth has not returned.  03/03/22: Patient messaged that his RLS was worsening on 01/11/22. I increased his Lyrica to 100 mg and asked that he reduce ropinirole to 5 mg (was taking 10 mg). I further reduced this to 2.5 mg (1/2 tablet on 01/26/22). Patient reported improvement on 02/05/22.   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   Since patient increased Lyrica to 100 mg, his symptoms have resolved. He continues to take magnesium. He no longer takes ropinirole.   Most recent Assessment and Plan (03/03/22): This is Jay Fox, a 72 y.o. male with RLS. Currently well controlled on Lyrica 100 mg daily and off of ropinirole. 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   Plan: -Repeat IFE, will consider hematology consult if monoclonal protein is  seen -Lyrica 100 mg qhs -Consider retrying melatonin to help sleep  Since their last visit: Patient's IFE again showed a faint IgG lambda monoclonal antibody. I referred him to hematology. There was no indication of abnormal M protein on further testing and no indication of plasma cell dyscrasia.  Patient has noticed increase in symptoms in legs needing to move over the last 2 months. The last couple have weeks have been the worse. Patient is prescribed Lyrica 100 mg at bedtime. He takes 100 mg around 9:30 pm. He may take 1/2 capsule or full capsule later in the night. Taking more Lyrica will help with the symptoms.  He continues on BiPAP for OSA.  He has no other complaints.  MEDICATIONS:  Outpatient Encounter Medications as of 09/03/2022  Medication Sig   albuterol (PROVENTIL) (2.5 MG/3ML) 0.083% nebulizer solution Take 3 mLs (2.5 mg total) by nebulization every 6 (six) hours as needed for wheezing or shortness of breath.   albuterol (VENTOLIN HFA) 108 (90 Base) MCG/ACT inhaler Inhale 2 puffs into the lungs every 6 (six) hours as needed for wheezing or shortness of breath.   amLODipine (NORVASC) 10 MG tablet Take 1 tablet (10 mg total) by mouth daily.   Ascorbic Acid (VITAMIN C PO) Take 1-2 tablets by mouth 2 (two) times daily. 500mg    budesonide-formoterol (SYMBICORT) 160-4.5 MCG/ACT inhaler Inhale 2 puffs into the lungs 2 (two) times daily.   calcium carbonate (SUPER CALCIUM) 1500 (600 Ca) MG TABS tablet 1 tablet with meals Orally Twice a day for 30 day(s)   calcium carbonate (TUMS - DOSED IN MG ELEMENTAL CALCIUM) 500 MG chewable tablet Chew 1 tablet by mouth daily.   Calcium Carbonate-Vit D-Min (CALCIUM 1200 PO) Take 1 tablet by mouth daily.   Cholecalciferol (VITAMIN D PO) Take 1 tablet by mouth daily.   Cholecalciferol (VITAMIN D3) 50 MCG (2000 UT) TABS Take 1 tablet by mouth daily.   diclofenac Sodium (VOLTAREN) 1 % GEL Place onto the skin.   fluticasone (FLONASE) 50 MCG/ACT nasal  spray Place into the nose.   irbesartan (AVAPRO) 300 MG tablet Take 1 tablet by mouth once daily   Multiple Vitamin (MULTIVITAMIN) tablet Take 1 tablet by mouth daily.   Omega-3 Fatty Acids (FISH OIL) 1200 MG CAPS Take 1,200 mg by mouth in the morning and at bedtime.   Respiratory Therapy Supplies MISC Filters Hoses tubing masks headgear cleaning supplies for dreamwear and  philips respironics bipap machine.   sildenafil (REVATIO) 20 MG tablet    Spacer/Aero-Holding Chambers (AEROCHAMBER MV) inhaler Use as directed with albuterol inhaler.   TURMERIC PO Take 1,000 mg by mouth in the morning and at bedtime.   vitamin B-12 (CYANOCOBALAMIN) 500 MCG tablet Take 1 tablet by mouth daily.   zolpidem (AMBIEN) 10 MG tablet Take 1 tablet (10 mg total) by mouth at bedtime as needed for sleep. Can NOT take with opioid pain medicine, alcohol, or other sedative.  Risk  dementia/mornings impaired alertness   [DISCONTINUED] pregabalin (LYRICA) 50 MG capsule TAKE 2 CAPSULES BY MOUTH AT BEDTIME   mirtazapine (REMERON) 7.5 MG tablet Take 1 tablet (7.5 mg total) by mouth at bedtime. Use instead of Ambien if possible, ok to put Ambien on top of. (Patient not taking: Reported on 09/03/2022)   pregabalin (LYRICA) 150 MG capsule Take 1 capsule (150 mg total) by mouth at bedtime.   No facility-administered encounter medications on file as of 09/03/2022.    PAST MEDICAL HISTORY: Past Medical History:  Diagnosis Date   Allergy 1953   Altered olfactory perception 05/31/2022   Smells cigarettes sometimes     Anxiety    Arthritis    Asthma    BPH (benign prostatic hypertrophy)    BPH (benign prostatic hypertrophy)    Cancer (HCC)    Cataract    Chronic kidney disease    Depression    GERD (gastroesophageal reflux disease)    History of kidney cancer    History of prostate cancer 11/30/2021   Hypertension    Leg swelling 11/30/2021   Noticed early 2023 with ferritin deposition.    OSA (obstructive sleep apnea)  11/30/2021   Restless legs 11/30/2021   Seasonal allergies    Sleep apnea     PAST SURGICAL HISTORY: Past Surgical History:  Procedure Laterality Date   NASAL SINUS SURGERY  2019   RENAL CRYOABLATION     TONSILLECTOMY      ALLERGIES: Allergies  Allergen Reactions   Shellfish Allergy Rash, Shortness Of Breath and Swelling    FAMILY HISTORY: Family History  Problem Relation Age of Onset   Emphysema Mother    Liver cancer Mother    Pancreatic cancer Mother    Anxiety disorder Mother    Cancer Mother    Hypertension Mother    Multiple myeloma Father    Cancer Father    Hypertension Father    Colon cancer Paternal Grandmother    Colon cancer Paternal Grandfather     SOCIAL HISTORY: Social History   Tobacco Use   Smoking status: Never   Smokeless tobacco: Never  Vaping Use   Vaping Use: Never used  Substance Use Topics   Alcohol use: Yes    Alcohol/week: 10.0 standard drinks of alcohol    Types: 10 Standard drinks or equivalent per week    Comment: Varies sometimes not at all   Drug use: No   Social History   Social History Narrative   Right handed   Caffeine none   Lives in one story home      Objective:  Vital Signs:  BP (!) 103/52   Pulse 60   Ht 5' 9.5" (1.765 m)   Wt 186 lb (84.4 kg)   SpO2 92%   BMI 27.07 kg/m   General: No acute distress.  Patient appears well-groomed.   Head:  Normocephalic/atraumatic Neck: supple Lungs:  Non-labored breathing on room air  Neurological Exam: alert and oriented.  Speech fluent and not dysarthric, language intact.  CN II-XII intact. Bulk and tone normal, muscle strength 5/5 throughout.  Sensation to light touch intact.  Deep tendon reflexes 2+ throughout.  Finger to nose testing intact.  Gait normal.   Labs and Imaging review: New results: IFE (03/03/22): faint IgG lambda monoclonal immunoglobulin detected  MM panel (04/05/22): No M protein seen K/L (04/05/22): wnl CBC and CMP (04/05/22):  unremarkable  Previously reviewed results: 12/02/21: B12: 470 IFE: IgG lambda monoclonal ab present HbA1c: 6.0  12/01/21: Normal or unremarkable: CBC, CMP, Ferritin 134.1  EMG (01/25/22): NCV & EMG Findings: Extensive electrodiagnostic evaluation of the left lower limb shows: Left sural and superficial peroneal sensory studies are within normal limits. Left peroneal (EDB) and tibial (AH) motor studies are within normal limits. Left H reflex response shows a mildly prolonged latency. Chronic motor axon loss changes without accompanying active denervation changes are seen in the left tibialis anterior, flexor digitorum longus, medial head of gastrocnemius, and short head of bicep femoris muscles.   Impression: This is an abnormal electrodiagnostic evaluation. 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 No electrodiagnostic evidence of a generalized polyneuropathy or myopathy.  Assessment/Plan:  This is Murilo Pattison, a 72 y.o. male with restless leg syndrome with normal ferritin (12/01/21). 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 He has noticed some increase in symptoms, but a self increase in Lyrica helped symptoms.  Plan: -Increase Lyrica to 150 mg at bedtime -Again recommend retrying melatonin to help with sleep  Return to clinic in 6 months   Jacquelyne Balint, MD

## 2022-09-02 DIAGNOSIS — M9903 Segmental and somatic dysfunction of lumbar region: Secondary | ICD-10-CM | POA: Diagnosis not present

## 2022-09-02 DIAGNOSIS — M5136 Other intervertebral disc degeneration, lumbar region: Secondary | ICD-10-CM | POA: Diagnosis not present

## 2022-09-03 ENCOUNTER — Encounter: Payer: Self-pay | Admitting: Neurology

## 2022-09-03 ENCOUNTER — Ambulatory Visit: Payer: Medicare Other | Admitting: Neurology

## 2022-09-03 VITALS — BP 103/52 | HR 60 | Ht 69.5 in | Wt 186.0 lb

## 2022-09-03 DIAGNOSIS — R209 Unspecified disturbances of skin sensation: Secondary | ICD-10-CM

## 2022-09-03 DIAGNOSIS — G4733 Obstructive sleep apnea (adult) (pediatric): Secondary | ICD-10-CM | POA: Diagnosis not present

## 2022-09-03 DIAGNOSIS — G4700 Insomnia, unspecified: Secondary | ICD-10-CM

## 2022-09-03 DIAGNOSIS — M545 Low back pain, unspecified: Secondary | ICD-10-CM | POA: Diagnosis not present

## 2022-09-03 DIAGNOSIS — G2581 Restless legs syndrome: Secondary | ICD-10-CM

## 2022-09-03 DIAGNOSIS — M5417 Radiculopathy, lumbosacral region: Secondary | ICD-10-CM

## 2022-09-03 DIAGNOSIS — G8929 Other chronic pain: Secondary | ICD-10-CM

## 2022-09-03 MED ORDER — PREGABALIN 150 MG PO CAPS: 150.0000 mg | ORAL_CAPSULE | Freq: Every day | ORAL | 3 refills | Status: DC

## 2022-09-03 NOTE — Patient Instructions (Signed)
Increase your Lyrica to 150 mg at bedtime. A new prescription with 150 mg capsules has been sent to your pharmacy.  You may consider trying melatonin 3 mg before bedtime to help with sleep. Also discuss with your sleep doctor if needed.  I will see you back in clinic in 6 months or sooner if needed. Please let me know if you have any questions or concerns in the meantime.   The physicians and staff at [facility] are committed to providing excellent care. You may receive a survey requesting feedback about your experience at our office. We strive to receive "very good" responses to the survey questions. If you feel that your experience would prevent you from giving the office a "very good " response, please contact our office to try to remedy the situation. We may be reached at [phone]. Thank you for taking the time out of your busy day to complete the survey.  Jacquelyne Balint, MD [facility]

## 2022-09-22 ENCOUNTER — Ambulatory Visit (INDEPENDENT_AMBULATORY_CARE_PROVIDER_SITE_OTHER): Payer: Medicare Other | Admitting: Family

## 2022-09-22 VITALS — BP 119/69 | HR 70 | Temp 98.2°F | Ht 69.5 in | Wt 187.2 lb

## 2022-09-22 DIAGNOSIS — M545 Low back pain, unspecified: Secondary | ICD-10-CM

## 2022-09-22 NOTE — Patient Instructions (Addendum)
It was very nice to see you today!   I recommend applying heat to your back for 20-30 min several times per day to help with healing. You can apply ice after activity that aggravates the pain for the same amount of time. Look for over the counter Lidocaine patches you can wear as needed to help with the pain.  Let us know if the pain persists past another 1-2 weeks.     PLEASE NOTE:  If you had any lab tests please let us know if you have not heard back within a few days. You may see your results on MyChart before we have a chance to review them but we will give you a call once they are reviewed by [place]. If we ordered any referrals today, please let us know if you have not heard from their office within the next week.

## 2022-09-22 NOTE — Progress Notes (Signed)
.  Patient ID: Jay Fox, male    DOB: 1950-11-29, 72 y.o.   MRN: [id number]  Chief Complaint  Patient presents with   Back Pain    sx for 5 d    HPI:      Lumbar pain:  Pt c/o lower left back pain since Friday, had been sitting for a long time in a hard chair at a convention.  and it has been sore since. Pain is achy and off and on. Started from leaning on a seat with a hard back. Has not tried any medications or home remedies. Reports having CT scan end of last year and all normal, reports hx of kidney cancer.       Assessment & Plan:  1. Left lumbar pain reassured pt I do not believe this pain is r/t his kidney. Tender with palpation. Advised on using heat for up to 20-51min tid when resting at home. Avoid activities that worsen the pain, if feels worse, apply ice for same amount of time. Can also try OTC Lidocaine patches tid prn. Ok to take up to 600mg  Ibuprofen bid or 1-2 Naproxen bid prn. Call back if pain not improving in 1-2w.   Subjective:    Outpatient Medications Prior to Visit  Medication Sig Dispense Refill   albuterol (PROVENTIL) (2.5 MG/3ML) 0.083% nebulizer solution Take 3 mLs (2.5 mg total) by nebulization every 6 (six) hours as needed for wheezing or shortness of breath. 150 mL 1   albuterol (VENTOLIN HFA) 108 (90 Base) MCG/ACT inhaler Inhale 2 puffs into the lungs every 6 (six) hours as needed for wheezing or shortness of breath. 18 g 11   amLODipine (NORVASC) 10 MG tablet Take 1 tablet (10 mg total) by mouth daily. 90 tablet 3   Ascorbic Acid (VITAMIN C PO) Take 1-2 tablets by mouth 2 (two) times daily. 500mg      budesonide-formoterol (SYMBICORT) 160-4.5 MCG/ACT inhaler Inhale 2 puffs into the lungs 2 (two) times daily. 1 each 12   calcium carbonate (SUPER CALCIUM) 1500 (600 Ca) MG TABS tablet 1 tablet with meals Orally Twice a day for 30 day(s)     calcium carbonate (TUMS - DOSED IN MG ELEMENTAL CALCIUM) 500 MG chewable tablet Chew 1 tablet by mouth daily.      Calcium Carbonate-Vit D-Min (CALCIUM 1200 PO) Take 1 tablet by mouth daily.     Cholecalciferol (VITAMIN D PO) Take 1 tablet by mouth daily.     Cholecalciferol (VITAMIN D3) 50 MCG (2000 UT) TABS Take 1 tablet by mouth daily.     diclofenac Sodium (VOLTAREN) 1 % GEL Place onto the skin.     fluticasone (FLONASE) 50 MCG/ACT nasal spray Place into the nose.     irbesartan (AVAPRO) 300 MG tablet Take 1 tablet by mouth once daily 90 tablet 0   mirtazapine (REMERON) 7.5 MG tablet Take 1 tablet (7.5 mg total) by mouth at bedtime. Use instead of Ambien if possible, ok to put Ambien on top of. 90 tablet 3   Multiple Vitamin (MULTIVITAMIN) tablet Take 1 tablet by mouth daily.     Omega-3 Fatty Acids (FISH OIL) 1200 MG CAPS Take 1,200 mg by mouth in the morning and at bedtime.     pregabalin (LYRICA) 150 MG capsule Take 1 capsule (150 mg total) by mouth at bedtime. 90 capsule 3   Respiratory Therapy Supplies MISC Filters Hoses tubing masks headgear cleaning supplies for dreamwear and  philips respironics bipap machine. 2 each 0  sildenafil (REVATIO) 20 MG tablet      Spacer/Aero-Holding Chambers (AEROCHAMBER MV) inhaler Use as directed with albuterol inhaler. 1 each 0   TURMERIC PO Take 1,000 mg by mouth in the morning and at bedtime.     vitamin B-12 (CYANOCOBALAMIN) 500 MCG tablet Take 1 tablet by mouth daily.     zolpidem (AMBIEN) 10 MG tablet Take 1 tablet (10 mg total) by mouth at bedtime as needed for sleep. Can NOT take with opioid pain medicine, alcohol, or other sedative.  Risk dementia/mornings impaired alertness 90 tablet 1   No facility-administered medications prior to visit.   Past Medical History:  Diagnosis Date   Allergy 1954   Altered olfactory perception 05/31/2022   Smells cigarettes sometimes     Anxiety    Arthritis    Asthma    BPH (benign prostatic hypertrophy)    BPH (benign prostatic hypertrophy)    Cancer (HCC)    Cataract    Chronic kidney disease    Depression     GERD (gastroesophageal reflux disease)    History of kidney cancer    History of prostate cancer 11/30/2021   Hypertension    Leg swelling 11/30/2021   Noticed early 2023 with ferritin deposition.    OSA (obstructive sleep apnea) 11/30/2021   Restless legs 11/30/2021   Seasonal allergies    Sleep apnea    Past Surgical History:  Procedure Laterality Date   NASAL SINUS SURGERY  2019   RENAL CRYOABLATION     TONSILLECTOMY     Allergies  Allergen Reactions   Shellfish Allergy Rash, Shortness Of Breath and Swelling      Objective:    Physical Exam Vitals and nursing note reviewed.  Constitutional:      General: He is not in acute distress.    Appearance: Normal appearance.  HENT:     Head: Normocephalic.  Cardiovascular:     Rate and Rhythm: Normal rate and regular rhythm.  Pulmonary:     Effort: Pulmonary effort is normal.     Breath sounds: Normal breath sounds.  Musculoskeletal:     Cervical back: Normal range of motion.     Lumbar back: Tenderness present. No swelling (no eccymosis), spasms or bony tenderness. Decreased range of motion.  Skin:    General: Skin is warm and dry.  Neurological:     Mental Status: He is alert and oriented to person, place, and time.  Psychiatric:        Mood and Affect: Mood normal.    BP 119/69   Pulse 70   Temp 98.2 F (36.8 C) (Temporal)   Ht 5' 9.5" (1.765 m)   Wt 187 lb 3.2 oz (84.9 kg)   SpO2 98%   BMI 27.25 kg/m  Wt Readings from Last 3 Encounters:  09/22/22 187 lb 3.2 oz (84.9 kg)  09/03/22 186 lb (84.4 kg)  05/31/22 185 lb 12.8 oz (84.3 kg)       Dulce Sellar, NP

## 2022-09-25 ENCOUNTER — Other Ambulatory Visit: Payer: Self-pay | Admitting: Internal Medicine

## 2022-09-25 DIAGNOSIS — I10 Essential (primary) hypertension: Secondary | ICD-10-CM

## 2022-10-06 DIAGNOSIS — M1711 Unilateral primary osteoarthritis, right knee: Secondary | ICD-10-CM | POA: Diagnosis not present

## 2022-10-28 ENCOUNTER — Encounter (INDEPENDENT_AMBULATORY_CARE_PROVIDER_SITE_OTHER): Payer: Self-pay

## 2022-10-29 DIAGNOSIS — H25812 Combined forms of age-related cataract, left eye: Secondary | ICD-10-CM | POA: Diagnosis not present

## 2022-11-12 DIAGNOSIS — G2581 Restless legs syndrome: Secondary | ICD-10-CM | POA: Diagnosis not present

## 2022-11-12 DIAGNOSIS — G4733 Obstructive sleep apnea (adult) (pediatric): Secondary | ICD-10-CM | POA: Diagnosis not present

## 2022-11-12 DIAGNOSIS — G4700 Insomnia, unspecified: Secondary | ICD-10-CM | POA: Diagnosis not present

## 2022-11-12 DIAGNOSIS — M545 Low back pain, unspecified: Secondary | ICD-10-CM | POA: Diagnosis not present

## 2022-11-14 HISTORY — PX: CATARACT EXTRACTION, BILATERAL: SHX1313

## 2022-11-16 DIAGNOSIS — C61 Malignant neoplasm of prostate: Secondary | ICD-10-CM | POA: Diagnosis not present

## 2022-11-17 DIAGNOSIS — M5136 Other intervertebral disc degeneration, lumbar region: Secondary | ICD-10-CM | POA: Diagnosis not present

## 2022-11-17 DIAGNOSIS — M9903 Segmental and somatic dysfunction of lumbar region: Secondary | ICD-10-CM | POA: Diagnosis not present

## 2022-11-23 DIAGNOSIS — N393 Stress incontinence (female) (male): Secondary | ICD-10-CM | POA: Diagnosis not present

## 2022-11-23 DIAGNOSIS — C61 Malignant neoplasm of prostate: Secondary | ICD-10-CM | POA: Diagnosis not present

## 2022-11-23 DIAGNOSIS — C642 Malignant neoplasm of left kidney, except renal pelvis: Secondary | ICD-10-CM | POA: Diagnosis not present

## 2022-11-24 DIAGNOSIS — H2511 Age-related nuclear cataract, right eye: Secondary | ICD-10-CM | POA: Diagnosis not present

## 2022-11-26 DIAGNOSIS — H25811 Combined forms of age-related cataract, right eye: Secondary | ICD-10-CM | POA: Diagnosis not present

## 2022-11-26 DIAGNOSIS — H5709 Other anomalies of pupillary function: Secondary | ICD-10-CM | POA: Diagnosis not present

## 2022-12-01 ENCOUNTER — Encounter: Payer: Medicare Other | Admitting: Internal Medicine

## 2022-12-02 DIAGNOSIS — K080 Exfoliation of teeth due to systemic causes: Secondary | ICD-10-CM | POA: Diagnosis not present

## 2022-12-03 ENCOUNTER — Other Ambulatory Visit: Payer: Self-pay | Admitting: *Deleted

## 2022-12-03 DIAGNOSIS — J4540 Moderate persistent asthma, uncomplicated: Secondary | ICD-10-CM

## 2022-12-03 MED ORDER — BUDESONIDE-FORMOTEROL FUMARATE 160-4.5 MCG/ACT IN AERO: 2.0000 | INHALATION_SPRAY | Freq: Two times a day (BID) | RESPIRATORY_TRACT | 12 refills | Status: DC

## 2022-12-07 DIAGNOSIS — G4733 Obstructive sleep apnea (adult) (pediatric): Secondary | ICD-10-CM | POA: Diagnosis not present

## 2022-12-07 DIAGNOSIS — G4731 Primary central sleep apnea: Secondary | ICD-10-CM | POA: Diagnosis not present

## 2022-12-08 ENCOUNTER — Encounter: Payer: Self-pay | Admitting: Internal Medicine

## 2022-12-08 ENCOUNTER — Ambulatory Visit (INDEPENDENT_AMBULATORY_CARE_PROVIDER_SITE_OTHER): Payer: Medicare Other | Admitting: Internal Medicine

## 2022-12-08 VITALS — BP 130/71 | HR 70 | Temp 98.5°F | Resp 18 | Ht 69.5 in | Wt 188.1 lb

## 2022-12-08 DIAGNOSIS — R0989 Other specified symptoms and signs involving the circulatory and respiratory systems: Secondary | ICD-10-CM

## 2022-12-08 DIAGNOSIS — R140 Abdominal distension (gaseous): Secondary | ICD-10-CM

## 2022-12-08 DIAGNOSIS — G4709 Other insomnia: Secondary | ICD-10-CM

## 2022-12-08 DIAGNOSIS — R7303 Prediabetes: Secondary | ICD-10-CM | POA: Diagnosis not present

## 2022-12-08 DIAGNOSIS — Z0001 Encounter for general adult medical examination with abnormal findings: Secondary | ICD-10-CM | POA: Diagnosis not present

## 2022-12-08 DIAGNOSIS — E785 Hyperlipidemia, unspecified: Secondary | ICD-10-CM | POA: Insufficient documentation

## 2022-12-08 DIAGNOSIS — R590 Localized enlarged lymph nodes: Secondary | ICD-10-CM | POA: Insufficient documentation

## 2022-12-08 DIAGNOSIS — Z0000 Encounter for general adult medical examination without abnormal findings: Secondary | ICD-10-CM

## 2022-12-08 HISTORY — DX: Localized enlarged lymph nodes: R59.0

## 2022-12-08 HISTORY — DX: Hyperlipidemia, unspecified: E78.5

## 2022-12-08 LAB — COMPREHENSIVE METABOLIC PANEL
ALT: 21 U/L (ref 0–53)
AST: 18 U/L (ref 0–37)
Albumin: 4.5 g/dL (ref 3.5–5.2)
Alkaline Phosphatase: 67 U/L (ref 39–117)
BUN: 17 mg/dL (ref 6–23)
CO2: 27 mEq/L (ref 19–32)
Calcium: 9.9 mg/dL (ref 8.4–10.5)
Chloride: 105 mEq/L (ref 96–112)
Creatinine, Ser: 1.02 mg/dL (ref 0.40–1.50)
GFR: 73.99 mL/min (ref >60.00)
Glucose, Bld: 109 mg/dL — ABNORMAL HIGH (ref 70–99)
Potassium: 4.1 mEq/L (ref 3.5–5.1)
Sodium: 141 mEq/L (ref 135–145)
Total Bilirubin: 0.7 mg/dL (ref 0.2–1.2)
Total Protein: 7.1 g/dL (ref 6.0–8.3)

## 2022-12-08 LAB — CBC
HCT: 42.9 % (ref 39.0–52.0)
Hemoglobin: 14.2 g/dL (ref 13.0–17.0)
MCHC: 33.1 g/dL (ref 30.0–36.0)
MCV: 87.8 fl (ref 78.0–100.0)
Platelets: 240 10*3/uL (ref 150.0–400.0)
RBC: 4.89 Mil/uL (ref 4.22–5.81)
RDW: 14.1 % (ref 11.5–15.5)
WBC: 6 10*3/uL (ref 4.0–10.5)

## 2022-12-08 LAB — LIPID PANEL
Cholesterol: 134 mg/dL (ref 0–200)
HDL: 50.9 mg/dL (ref >39.00)
LDL Cholesterol: 38 mg/dL (ref 0–99)
NonHDL: 83.39
Total CHOL/HDL Ratio: 3
Triglycerides: 227 mg/dL — ABNORMAL HIGH (ref 0.0–149.0)
VLDL: 45.4 mg/dL — ABNORMAL HIGH (ref 0.0–40.0)

## 2022-12-08 LAB — TSH: TSH: 2.28 u[IU]/mL (ref 0.35–5.50)

## 2022-12-08 LAB — HEMOGLOBIN A1C: Hgb A1c MFr Bld: 5.9 % (ref 4.6–6.5)

## 2022-12-08 NOTE — Assessment & Plan Note (Signed)
Abdominal distention was noted during the physical exam, suggesting possible fluid accumulation. He reported increased snacking. Lab work will be ordered to investigate potential causes. He is encouraged to reduce junk food intake and increase physical activity.  He feels this is a chronic problem without much change.

## 2022-12-08 NOTE — Assessment & Plan Note (Signed)
Multiple borderline enlarged lymph nodes in the neck were noted, potentially due to a recent skin infection or dental issues. He has no history of smoking or symptoms indicative of neck cancer, although he has a history of prostate and kidney cancer. We will follow up in 1 month to reassess the lymph nodes. Should the lymph nodes not resolve, considering his cancer history, a CT scan or MRI of the neck will be considered.

## 2022-12-08 NOTE — Assessment & Plan Note (Signed)
Jugular venous distention was observed during the physical exam without any reported symptoms of heart failure. Given his history of prostate and kidney cancer, he will be referred to cardiology for further evaluation.  We discussed coronary CT calcium scoring but he wished to wait to decide about it.

## 2022-12-08 NOTE — Assessment & Plan Note (Signed)
He reported discontinuing mirtazapine due to lack of efficacy while continuing zolpidem. The risks associated with zolpidem use will be discussed at the next visit AND were revisited today as part of his anticipatory counseling and medication(s) reconciliation

## 2022-12-08 NOTE — Addendum Note (Signed)
Addended by: Lula Olszewski on: 12/08/2022 10:36 AM   Modules accepted: Orders

## 2022-12-08 NOTE — Patient Instructions (Signed)
Health Maintenance, Male Adopting a healthy lifestyle and getting preventive care are important in promoting health and wellness. Ask your health care provider about: The right schedule for you to have regular tests and exams. Things you can do on your own to prevent diseases and keep yourself healthy. What should I know about diet, weight, and exercise? Eat a healthy diet  Eat a diet that includes plenty of vegetables, fruits, low-fat dairy products, and lean protein. Do not eat a lot of foods that are high in solid fats, added sugars, or sodium. Maintain a healthy weight Body mass index (BMI) is a measurement that can be used to identify possible weight problems. It estimates body fat based on height and weight. Your health care provider can help determine your BMI and help you achieve or maintain a healthy weight. Get regular exercise Get regular exercise. This is one of the most important things you can do for your health. Most adults should: Exercise for at least 150 minutes each week. The exercise should increase your heart rate and make you sweat (moderate-intensity exercise). Do strengthening exercises at least twice a week. This is in addition to the moderate-intensity exercise. Spend less time sitting. Even light physical activity can be beneficial. Watch cholesterol and blood lipids Have your blood tested for lipids and cholesterol at 71 years of age, then have this test every 5 years. You may need to have your cholesterol levels checked more often if: Your lipid or cholesterol levels are high. You are older than 71 years of age. You are at high risk for heart disease. What should I know about cancer screening? Many types of cancers can be detected early and may often be prevented. Depending on your health history and family history, you may need to have cancer screening at various ages. This may include screening for: Colorectal cancer. Prostate cancer. Skin cancer. Lung  cancer. What should I know about heart disease, diabetes, and high blood pressure? Blood pressure and heart disease High blood pressure causes heart disease and increases the risk of stroke. This is more likely to develop in people who have high blood pressure readings or are overweight. Talk with your health care provider about your target blood pressure readings. Have your blood pressure checked: Every 3-5 years if you are 53-71 years of age. Every year if you are 37 years old or older. If you are between the ages of 61 and 47 and are a current or former smoker, ask your health care provider if you should have a one-time screening for abdominal aortic aneurysm (AAA). Diabetes Have regular diabetes screenings. This checks your fasting blood sugar level. Have the screening done: Once every three years after age 23 if you are at a normal weight and have a low risk for diabetes. More often and at a younger age if you are overweight or have a high risk for diabetes. What should I know about preventing infection? Hepatitis B If you have a higher risk for hepatitis B, you should be screened for this virus. Talk with your health care provider to find out if you are at risk for hepatitis B infection. Hepatitis C Blood testing is recommended for: Everyone born from [birth year] through 1964. Anyone with known risk factors for hepatitis C. Sexually transmitted infections (STIs) You should be screened each year for STIs, including gonorrhea and chlamydia, if: You are sexually active and are younger than 71 years of age. You are older than 71 years of age and your  health care provider tells you that you are at risk for this type of infection. Your sexual activity has changed since you were last screened, and you are at increased risk for chlamydia or gonorrhea. Ask your health care provider if you are at risk. Ask your health care provider about whether you are at high risk for HIV. Your health care provider  may recommend a prescription medicine to help prevent HIV infection. If you choose to take medicine to prevent HIV, you should first get tested for HIV. You should then be tested every 3 months for as long as you are taking the medicine. Follow these instructions at home: Alcohol use Do not drink alcohol if your health care provider tells you not to drink. If you drink alcohol: Limit how much you have to 0-2 drinks a day. Know how much alcohol is in your drink. In the [place] one drink equals one 12 oz bottle of beer (355 mL), one 5 oz glass of wine (148 mL), or one 1 oz glass of hard liquor (44 mL). Lifestyle Do not use any products that contain nicotine or tobacco. These products include cigarettes, chewing tobacco, and vaping devices, such as e-cigarettes. If you need help quitting, ask your health care provider. Do not use street drugs. Do not share needles. Ask your health care provider for help if you need support or information about quitting drugs. General instructions Schedule regular health, dental, and eye exams. Stay current with your vaccines. Tell your health care provider if: You often feel depressed. You have ever been abused or do not feel safe at home. Summary Adopting a healthy lifestyle and getting preventive care are important in promoting health and wellness. Follow your health care provider's instructions about healthy diet, exercising, and getting tested or screened for diseases. Follow your health care provider's instructions on monitoring your cholesterol and blood pressure. This information is not intended to replace advice given to you by your health care provider. Make sure you discuss any questions you have with your health care provider. Document Revised: 07/21/2020 Document Reviewed: 07/21/2020 Elsevier Patient Education  2024 [employer]     Why follow it? Research shows. Those who follow the Mediterranean diet have a reduced risk of heart disease  The  diet is associated with a reduced incidence of Parkinson's and Alzheimer's diseases People following the diet may have longer life expectancies and lower rates of chronic diseases  The Dietary Guidelines for Americans recommends the Mediterranean diet as an eating plan to promote health and prevent disease  What Is the Mediterranean Diet?  Healthy eating plan based on typical foods and recipes of Mediterranean-style cooking The diet is primarily a plant based diet; these foods should make up a majority of meals   Starches - Plant based foods should make up a majority of meals - They are an important sources of vitamins, minerals, energy, antioxidants, and fiber - Choose whole grains, foods high in fiber and minimally processed items  - Typical grain sources include wheat, oats, barley, corn, brown rice, bulgar, farro, millet, polenta, couscous  - Various types of beans include chickpeas, lentils, fava beans, black beans, white beans   Fruits  Veggies - Large quantities of antioxidant rich fruits & veggies; 6 or more servings  - Vegetables can be eaten raw or lightly drizzled with oil and cooked  - Vegetables common to the traditional Mediterranean Diet include: artichokes, arugula, beets, broccoli, brussel sprouts, cabbage, carrots, celery, collard greens, cucumbers, eggplant, kale, leeks,  lemons, lettuce, mushrooms, okra, onions, peas, peppers, potatoes, pumpkin, radishes, rutabaga, shallots, spinach, sweet potatoes, turnips, zucchini - Fruits common to the Mediterranean Diet include: apples, apricots, avocados, cherries, clementines, dates, figs, grapefruits, grapes, melons, nectarines, oranges, peaches, pears, pomegranates, strawberries, tangerines  Fats - Replace butter and margarine with healthy oils, such as olive oil, canola oil, and tahini  - Limit nuts to no more than a handful a day  - Nuts include walnuts, almonds, pecans, pistachios, pine nuts  - Limit or avoid candied, honey roasted  or heavily salted nuts - Olives are central to the [employer] - can be eaten whole or used in a variety of dishes   Meats Protein - Limiting red meat: no more than a few times a month - When eating red meat: choose lean cuts and keep the portion to the size of deck of cards - Eggs: approx. 0 to 4 times a week  - Fish and lean poultry: at least 2 a week  - Healthy protein sources include, chicken, [place], lean beef, lamb - Increase intake of seafood such as tuna, salmon, trout, mackerel, shrimp, scallops - Avoid or limit high fat processed meats such as sausage and bacon  Dairy - Include moderate amounts of low fat dairy products  - Focus on healthy dairy such as fat free yogurt, skim milk, low or reduced fat cheese - Limit dairy products higher in fat such as whole or 2% milk, cheese, ice cream  Alcohol - Moderate amounts of red wine is ok  - No more than 5 oz daily for women (all ages) and men older than age 37  - No more than 10 oz of wine daily for men younger than 56  Other - Limit sweets and other desserts  - Use herbs and spices instead of salt to flavor foods  - Herbs and spices common to the traditional Mediterranean Diet include: basil, bay leaves, chives, cloves, cumin, fennel, garlic, lavender, marjoram, mint, oregano, parsley, pepper, rosemary, sage, savory, sumac, tarragon, thyme   It's not just a diet, it's a lifestyle:  The Mediterranean diet includes lifestyle factors typical of those in the region  Foods, drinks and meals are best eaten with others and savored Daily physical activity is important for overall good health This could be strenuous exercise like running and aerobics This could also be more leisurely activities such as walking, housework, yard-work, or taking the stairs Moderation is the key; a balanced and healthy diet accommodates most foods and drinks Consider portion sizes and frequency of consumption of certain foods   Meal Ideas & Options:   Breakfast:  Whole wheat toast or whole wheat English muffins with peanut butter & hard boiled egg Steel cut oats topped with apples & cinnamon and skim milk  Fresh fruit: banana, strawberries, melon, berries, peaches  Smoothies: strawberries, bananas, greek yogurt, peanut butter Low fat greek yogurt with blueberries and granola  Egg white omelet with spinach and mushrooms Breakfast couscous: whole wheat couscous, apricots, skim milk, cranberries  Sandwiches:  Hummus and grilled vegetables (peppers, zucchini, squash) on whole wheat bread   Grilled chicken on whole wheat pita with lettuce, tomatoes, cucumbers or tzatziki  [place] salad on whole wheat bread: tuna salad made with greek yogurt, olives, red peppers, capers, green onions Garlic rosemary lamb pita: lamb sauted with garlic, rosemary, salt & pepper; add lettuce, cucumber, greek yogurt to pita - flavor with lemon juice and black pepper  Seafood:  Mediterranean grilled salmon, seasoned with garlic,  basil, parsley, lemon juice and black pepper Shrimp, lemon, and spinach whole-grain pasta salad made with low fat greek yogurt  Seared scallops with lemon orzo  Seared tuna steaks seasoned salt, pepper, coriander topped with tomato mixture of olives, tomatoes, olive oil, minced garlic, parsley, green onions and cappers  Meats:  Herbed greek chicken salad with kalamata olives, cucumber, feta  Red bell peppers stuffed with spinach, bulgur, lean ground beef (or lentils) & topped with feta   Kebabs: skewers of chicken, tomatoes, onions, zucchini, squash  [place] burgers: made with red onions, mint, dill, lemon juice, feta cheese topped with roasted red peppers Vegetarian Cucumber salad: cucumbers, artichoke hearts, celery, red onion, feta cheese, tossed in olive oil & lemon juice  Hummus and whole grain pita points with a greek salad (lettuce, tomato, feta, olives, cucumbers, red onion) Lentil soup with celery, carrots made with vegetable broth,  garlic, salt and pepper  Tabouli salad: parsley, bulgur, mint, scallions, cucumbers, tomato, radishes, lemon juice, olive oil, salt and pepper.      American Heart Association (AHA) Exercise Recommendation  Being physically active is important to prevent heart disease and stroke, the nation's No. 1and No. 5killers. To improve overall cardiovascular health, we suggest at least 150 minutes per week of moderate exercise or 75 minutes per week of vigorous exercise (or a combination of moderate and vigorous activity). Thirty minutes a day, five times a week is an easy goal to remember. You will also experience benefits even if you divide your time into two or three segments of 10 to 15 minutes per day.  For people who would benefit from lowering their blood pressure or cholesterol, we recommend 40 minutes of aerobic exercise of moderate to vigorous intensity three to four times a week to lower the risk for heart attack and stroke.  Physical activity is anything that makes you move your body and burn calories.  This includes things like climbing stairs or playing sports. Aerobic exercises benefit your heart, and include walking, jogging, swimming or biking. Strength and stretching exercises are best for overall stamina and flexibility.  The simplest, positive change you can make to effectively improve your heart health is to start walking. It's enjoyable, free, easy, social and great exercise. A walking program is flexible and boasts high success rates because people can stick with it. It's easy for walking to become a regular and satisfying part of life.   For Overall Cardiovascular Health: At least 30 minutes of moderate-intensity aerobic activity at least 5 days per week for a total of 150  OR  At least 25 minutes of vigorous aerobic activity at least 3 days per week for a total of 75 minutes; or a combination of moderate- and vigorous-intensity aerobic activity  AND  Moderate- to high-intensity  muscle-strengthening activity at least 2 days per week for additional health benefits.  For Lowering Blood Pressure and Cholesterol An average 40 minutes of moderate- to vigorous-intensity aerobic activity 3 or 4 times per week  What if I can't make it to the time goal? Something is always better than nothing! And everyone has to start somewhere. Even if you've been sedentary for years, today is the day you can begin to make healthy changes in your life. If you don't think you'll make it for 30 or 40 minutes, set a reachable goal for today. You can work up toward your overall goal by increasing your time as you get stronger. Don't let all-or-nothing thinking rob you of doing what  you can every day.  Source:http://www.heart.org

## 2022-12-08 NOTE — Progress Notes (Addendum)
[facility]: [phone]   -- Annual Preventive Medical Office Visit --  Patient:  Jay Fox      Age: 72 y.o.       Sex:  male  Date:   12/08/2022 Patient Care Team: Lula Olszewski, MD as PCP - General (Internal Medicine) Storm Frisk, MD as Attending Physician (Pulmonary Disease) Antony Madura, MD as Consulting Physician (Neurology) Today's Healthcare Provider: Lula Olszewski, MD   Chief Complaint  Patient presents with   Annual Exam    CPE Not fasting    Patient made every effort to not report any medical problem and declined workup for any medical problems found; goal was to confine this appointment to just preventive care only for Comprehensive Physical Exam (CPE) preventive care annual visit. Assessment & Plan Encounter for annual general medical examination with abnormal findings in adult  Preventative health care  Prediabetes  Abdominal distension Abdominal distention was noted during the physical exam, suggesting possible fluid accumulation. He reported increased snacking. Lab work will be ordered to investigate potential causes. He is encouraged to reduce junk food intake and increase physical activity.  He feels this is a chronic problem without much change. Encounter for general adult medical examination with abnormal findings General Health Maintenance / Followup Plans Cholesterol and thyroid function tests will be ordered. A coronary CT will be considered, pending his interest and financial considerations. A dental follow-up for a tooth issue is recommended, along with a saline rinse for sinus symptoms and maintaining good oral hygiene until the dental follow-up. He is recommended to monitor for signs of melanoma and use sunscreen. A follow-up in 1 month is scheduled to reassess the lymph nodes and discuss insomnia management. A return visit on 12/28/2022 is set for Ambien follow-up.  Jugular venous distension Jugular venous  distention was observed during the physical exam without any reported symptoms of heart failure. Given his history of prostate and kidney cancer, he will be referred to cardiology for further evaluation.  We discussed coronary CT calcium scoring but he wished to wait to decide about it. Healthcare maintenance  Cervical lymphadenopathy Multiple borderline enlarged lymph nodes in the neck were noted, potentially due to a recent skin infection or dental issues. He has no history of smoking or symptoms indicative of neck cancer, although he has a history of prostate and kidney cancer. We will follow up in 1 month to reassess the lymph nodes. Should the lymph nodes not resolve, considering his cancer history, a CT scan or MRI of the neck will be considered. Other insomnia He reported discontinuing mirtazapine due to lack of efficacy while continuing zolpidem. The risks associated with zolpidem use will be discussed at the next visit AND were revisited today as part of his anticipatory counseling and medication(s) reconciliation         Diagnoses and all orders for this visit: Preventative health care Encounter for annual general medical examination with abnormal findings in adult Prediabetes -     CBC -     Comprehensive metabolic panel -     Lipid panel -     HgB A1c Abdominal distension -     CBC -     Comprehensive metabolic panel -     Lipid panel -     TSH Encounter for general adult medical examination with abnormal findings -     CBC -     Comprehensive metabolic panel -     Lipid panel -  TSH Jugular venous distension -     Ambulatory referral to Cardiology Healthcare maintenance Cervical lymphadenopathy     Today's Health Maintenance Counseling and Anticipatory Guidance:  Eye exams:  We encouraged patient to to complete eye exam every 1-2 years.  He reports his last eye exam was:  recent surgery  Dental health:  He reports he has been keeping up with his dental visits.   He intends to do so in the future.  We encourage regular tooth brushing/flossing.   He is having difficulty with insurance coverage needs e Sinus health:  We encouraged sterile saline nasal misting sinus rinses daily for pollen, to reduce allergies and risk for sinus infections.   Sterile can based misting products are recommended due to superior misting and ease of maintaining sterility  Sleep Apnea screening:  We encourage self monitoring of sleep quality with [employer] App and other tools/apps that are now available at retail Wears bipap already no issues Cardiovascular Risk Factor Reduction:   Advised patient of need for regular exercise and diet rich and fruits and vegetables and healthy fats to reduce risk of heart attack and stroke.  Avoid first- and second-hand smoke and stimulants.   Avoid extreme exercise- exercise in moderation (150 minutes per week is a good goal) Wt Readings from Last 3 Encounters:  12/08/22 188 lb 2 oz (85.3 kg)  09/22/22 187 lb 3.2 oz (84.9 kg)  09/03/22 186 lb (84.4 kg)  Body mass index is 27.38 kg/m. He reports his diet consists of plenty of junk food but slowed down He reports his exercise includes of used to bike but now can't walk up few flights of stair. Health maintenance and immunizations reviewed and he was encouraged to complete anything that is due: Immunization History  Administered Date(s) Administered   Fluad Quad(high Dose 65+) 11/21/2019   Influenza Whole 12/11/2010   Influenza, High Dose Seasonal PF 12/01/2022   Influenza,inj,Quad PF,6+ Mos 12/01/2021   Influenza,trivalent, recombinat, inj, PF 12/11/2010, 12/28/2011, 12/15/2012   Influenza-Unspecified 12/01/2021   Moderna Covid-19 Fall Seasonal Vaccine 75yrs & older 02/18/2022, 12/01/2022   Moderna Covid-19 Vaccine Bivalent Booster 57yrs & up 05/15/2019, 06/12/2019, 01/19/2021   Moderna SARS-COV2 Booster Vaccination 01/16/2020, 07/10/2020, 01/19/2021   Pneumococcal Polysaccharide-23  12/28/2011   Pneumococcal-Unspecified 07/04/2017   Tdap 07/05/2014, 05/12/2017   Zoster, Live 07/03/2014   Zoster, Unspecified 05/15/2017, 08/13/2017   Health Maintenance Due  Topic Date Due   Colonoscopy  Never done   Medicare Annual Wellness (AWV)  12/23/2022    Sexual transmitted infection screening: testing offered today, but patient declined as he feels he is low risk based on his sexual history.   Social History   Substance and Sexual Activity  Sexual Activity Not Currently     Substance use:  I discussed that my recommendation is total abstinence from all substances of abuse including smoke and 2nd hand smoke, alcohol, illicit drugs, smoking, inhalants, sugar.   Offered to assist with any use disorders or addictions.   Injury prevention: Discussed safety belts, safety helmets, smoke detectors. Cancer Screening:  Thyroid cancer screening: patient advised to check by palpating thyroid for nodules we felt multiple nodules in neck today but not in thyroid Prostate cancer screening:  Denies family history of prostate cancer or hematospermia so too young for screening by current guidelines.( Lab Results  Component Value Date   PSA 0.00 Repeated and verified X2. (L) 03/29/2022   Colon cancer screening:  Cologuard has been ordered Lung cancer screening: never smoker  Skin cancer screening-  Advised regular sunscreen use. He denies worrisome, changing, or new skin lesions. Showed him pictures of melanomas for reference: Health Maintenance Due  Topic Date Due   Colonoscopy  Never done   Medicare Annual Wellness (AWV)  12/23/2022  Cologuard has been ordered. His scheduled for Annual Wellness Visit (AWV) later this year  Return to care in 1 year for next preventative visit.   Subjective  AI-Extracted: Discussed the use of AI scribe software for clinical note transcription with the patient, who gave verbal consent to proceed.  History of Present Illness   The patient, a 72 year old  with a history of prostate and kidney cancer, presented for his annual physical exam. He reported a recent increase in weight, despite attempts to reduce food intake, particularly snacking at night. The patient also noted a decrease in exercise, with occasional bike riding and dog walking as his primary physical activities.  The patient has been experiencing some dental issues, with a problematic tooth that may be contributing to the presence of enlarged lymph nodes in the neck. He also reported skin issues, including frequent pimples and whiteheads.  The patient has a history of cataract surgery, with the most recent procedure performed approximately a week and a half prior to the consultation. He also uses a BiPAP machine for sleep apnea and has been compliant with its use.  The patient has a history of prostate and kidney cancer, both of which have been treated and are currently in remission. He has been followed by a urologist for these conditions.  The patient also reported changes in bowel habits since his prostate removal, but did not provide further details. He has been taking zolpidem for sleep, having discontinued mirtazapine due to lack of efficacy.  The patient expressed dissatisfaction with his home health care, feeling it was not providing value. 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      Review of Systems  Constitutional:  Negative for chills, diaphoresis, fever, malaise/fatigue and weight loss.  HENT:  Negative for congestion, ear discharge, ear pain, hearing loss, nosebleeds, sinus pain, sore throat and tinnitus.   Eyes:  Negative for blurred vision, double vision, photophobia, pain, discharge and redness.  Respiratory:  Negative for cough, hemoptysis, sputum production, shortness of breath, wheezing and stridor.   Cardiovascular:  Negative for chest pain, palpitations, orthopnea, claudication, leg  swelling and PND.  Gastrointestinal:  Negative for abdominal pain, blood in stool, constipation, diarrhea, heartburn, melena, nausea and vomiting.  Genitourinary:  Negative for dysuria, flank pain, frequency, hematuria and urgency.  Musculoskeletal:  Negative for back pain, falls, joint pain, myalgias and neck pain.  Skin:  Negative for itching and rash.  Neurological:  Negative for dizziness, tingling, tremors, sensory change, speech change, focal weakness, seizures, loss of consciousness, weakness and headaches.  Endo/Heme/Allergies:  Negative for environmental allergies and polydipsia. Does not bruise/bleed easily.  Psychiatric/Behavioral:  Negative for depression, hallucinations, memory loss, substance abuse and suicidal ideas. The patient is not nervous/anxious and does not have insomnia.    He reports some were positive but that he is not "coming to me complaining about any" due to the cost concerns.  Disclaimer about ROS at Annual Preventive Visits Patients are informed before the Review of Systems (ROS) that identifying significant medical issues during the wellness visit may require immediate attention, potentially resulting in a separate billable encounter beyond the scope of the preventive exam. This disclosure is  mandated by professional ethics and legal obligations, as healthcare providers must address any substantial health concerns raised during any patient interaction. A comprehensive ROS is required by insurance companies for billing the visit. However, this structure may inadvertently discourage patients from fully disclosing health concerns due to potential financial implications. Consequently, patients often emphasize that any positive ROS findings are related to stable chronic conditions, requesting that these not be discussed during the preventive visit to avoid additional charges. Patients may also ask that reported complaints not be listed in the ROS to prevent affecting  billing.  Problem list overviews that were updated at today's visit: Problem  Jugular Venous Distension  Cervical Lymphadenopathy  Abdominal Distension   I attest that I have reviewed and confirmed the patients current medications to meet the medication reconciliation requirement  Current Outpatient Medications on File Prior to Visit  Medication Sig   albuterol (PROVENTIL) (2.5 MG/3ML) 0.083% nebulizer solution Take 3 mLs (2.5 mg total) by nebulization every 6 (six) hours as needed for wheezing or shortness of breath.   albuterol (VENTOLIN HFA) 108 (90 Base) MCG/ACT inhaler Inhale 2 puffs into the lungs every 6 (six) hours as needed for wheezing or shortness of breath.   amLODipine (NORVASC) 10 MG tablet Take 1 tablet (10 mg total) by mouth daily.   Ascorbic Acid (VITAMIN C PO) Take 1-2 tablets by mouth 2 (two) times daily. 500mg    calcium carbonate (SUPER CALCIUM) 1500 (600 Ca) MG TABS tablet 1 tablet with meals Orally Twice a day for 30 day(s)   calcium carbonate (TUMS - DOSED IN MG ELEMENTAL CALCIUM) 500 MG chewable tablet Chew 1 tablet by mouth daily.   Calcium Carbonate-Vit D-Min (CALCIUM 1200 PO) Take 1 tablet by mouth daily.   Cholecalciferol (VITAMIN D PO) Take 1 tablet by mouth daily.   Cholecalciferol (VITAMIN D3) 50 MCG (2000 UT) TABS Take 1 tablet by mouth daily.   diclofenac Sodium (VOLTAREN) 1 % GEL Place onto the skin.   fluticasone (FLONASE) 50 MCG/ACT nasal spray Place into the nose.   irbesartan (AVAPRO) 300 MG tablet Take 1 tablet by mouth once daily   ketorolac (ACULAR) 0.5 % ophthalmic solution Place 1 drop into the left eye 4 (four) times daily.   Multiple Vitamin (MULTIVITAMIN) tablet Take 1 tablet by mouth daily.   ofloxacin (OCUFLOX) 0.3 % ophthalmic solution Place 1 drop into the left eye 4 (four) times daily.   Omega-3 Fatty Acids (FISH OIL) 1200 MG CAPS Take 1,200 mg by mouth in the morning and at bedtime.   prednisoLONE acetate (PRED FORTE) 1 % ophthalmic  suspension Place 1 drop into the left eye 4 (four) times daily.   pregabalin (LYRICA) 150 MG capsule Take 1 capsule (150 mg total) by mouth at bedtime.   Respiratory Therapy Supplies MISC Filters Hoses tubing masks headgear cleaning supplies for dreamwear and  philips respironics bipap machine.   sildenafil (REVATIO) 20 MG tablet 1 tablet Orally Once a day   Spacer/Aero-Holding Chambers (AEROCHAMBER MV) inhaler Use as directed with albuterol inhaler.   TURMERIC PO Take 1,000 mg by mouth in the morning and at bedtime.   vitamin B-12 (CYANOCOBALAMIN) 500 MCG tablet Take 1 tablet by mouth daily.   zolpidem (AMBIEN) 10 MG tablet Take 1 tablet (10 mg total) by mouth at bedtime as needed for sleep. Can NOT take with opioid pain medicine, alcohol, or other sedative.  Risk dementia/mornings impaired alertness   No current facility-administered medications on file prior to visit.  Medications Discontinued During This Encounter  Medication Reason   sildenafil (REVATIO) 20 MG tablet    budesonide-formoterol (SYMBICORT) 160-4.5 MCG/ACT inhaler Not covered by the pt's insurance   mirtazapine (REMERON) 7.5 MG tablet Ineffective   The following were reviewed and entered/updated into our electronic MEDICAL RECORD NUMBER   12/08/2022   10:00 AM  Depression screen PHQ 2/9  Decreased Interest 0  Down, Depressed, Hopeless 0  PHQ - 2 Score 0  Altered sleeping 2  Tired, decreased energy 2  Change in appetite 0  Feeling bad or failure about yourself  0  Trouble concentrating 2  Moving slowly or fidgety/restless 0  Suicidal thoughts 0  PHQ-9 Score 6  Difficult doing work/chores Somewhat difficult   Past Medical History:  Diagnosis Date   Allergy 1953   Altered olfactory perception 05/31/2022   Smells cigarettes sometimes     Anxiety    Arthritis    Asthma    BPH (benign prostatic hypertrophy)    BPH (benign prostatic hypertrophy)    Cancer (HCC)    Cataract    Chronic kidney disease    Depression     GERD (gastroesophageal reflux disease)    History of kidney cancer    History of prostate cancer 11/30/2021   Hypertension    Leg swelling 11/30/2021   Noticed early 2023 with ferritin deposition.    OSA (obstructive sleep apnea) 11/30/2021   Restless legs 11/30/2021   Seasonal allergies    Sleep apnea    Patient Active Problem List   Diagnosis Date Noted   Jugular venous distension 12/08/2022   Cervical lymphadenopathy 12/08/2022   Altered olfactory perception 05/31/2022   Back wound 03/29/2022   Generalized abdominal pain 03/29/2022   High risk medication use 03/29/2022   Abdominal distension 03/29/2022   History of prostate cancer 11/30/2021   History of radical prostatectomy 11/30/2021   Leg swelling 11/30/2021   Restless leg syndrome 11/30/2021   Prediabetes 11/30/2021   OSA (obstructive sleep apnea) 11/30/2021   History of renal cell carcinoma 02/22/2019   Prostate cancer (HCC) 11/21/2017   Insomnia 09/08/2012   Urinary incontinence 05/15/2012   Skin lesions, generalized 05/15/2012   Asthma    Hypertension    Seasonal allergies    Heart palpitations 04/12/2011   Benign essential hypertension 04/12/2011   Asthma, moderate persistent 04/12/2011   Past Surgical History:  Procedure Laterality Date   NASAL SINUS SURGERY  2019   RENAL CRYOABLATION     TONSILLECTOMY     Family History  Problem Relation Age of Onset   Emphysema Mother    Liver cancer Mother    Pancreatic cancer Mother    Anxiety disorder Mother    Cancer Mother    Hypertension Mother    Multiple myeloma Father    Cancer Father    Hypertension Father    Colon cancer Paternal Grandmother    Colon cancer Paternal Grandfather    Allergies  Allergen Reactions   Shellfish Allergy Rash, Shortness Of Breath and Swelling   Shellfish-Derived Products Shortness Of Breath   Social History   Tobacco Use   Smoking status: Never   Smokeless tobacco: Never  Vaping Use   Vaping status: Never Used   Substance Use Topics   Alcohol use: Yes    Alcohol/week: 10.0 standard drinks of alcohol    Types: 10 Standard drinks or equivalent per week    Comment: Varies sometimes not at all   Drug use: No  Objective  BP 130/71   Pulse 70   Temp 98.5 F (36.9 C) (Temporal)   Resp 18   Ht 5' 9.5" (1.765 m)   Wt 188 lb 2 oz (85.3 kg)   SpO2 95%   BMI 27.38 kg/m   Body mass index is 27.38 kg/m. Wt Readings from Last 3 Encounters:  12/08/22 188 lb 2 oz (85.3 kg)  09/22/22 187 lb 3.2 oz (84.9 kg)  09/03/22 186 lb (84.4 kg)  Physical Exam   HEENT: Nasal passages healthy, gold molars present, throat without giant tumor, right ear healthy, minor scarring in left ear. NECK: Borderline enlarged lymph nodes. CARDIOVASCULAR: Jugular venous distention, heart sounds strong and healthy with no murmurs and regular rhythm. ABDOMEN: Swollen. SKIN: No wrinkles except minor periorbital.      GEN: NAD, Resting Comfortably. CARDIOVASCULAR: S1 and S2 heart sounds have regular rate and rhythm with no murmurs appreciated. PULMONARY:  Normal work of breathing. Clear to auscultation bilaterally with no crackles, wheezes, or rhonchi. ABDOMEN: Soft, Nontender, MSK: No edema, cyanosis, or clubbing noted. SKIN: Warm, dry, no lesions of concern observed. NEURO: CN2-12 grossly intact. . Reflexes patellar asymmetric due to weak reflex on right knee from arthritis.  PSYCH: Normal affect and thought content, pleasant and cooperative.

## 2022-12-09 DIAGNOSIS — K080 Exfoliation of teeth due to systemic causes: Secondary | ICD-10-CM | POA: Diagnosis not present

## 2022-12-21 ENCOUNTER — Other Ambulatory Visit: Payer: Self-pay | Admitting: Internal Medicine

## 2022-12-21 DIAGNOSIS — I10 Essential (primary) hypertension: Secondary | ICD-10-CM

## 2022-12-22 DIAGNOSIS — M9903 Segmental and somatic dysfunction of lumbar region: Secondary | ICD-10-CM | POA: Diagnosis not present

## 2022-12-28 ENCOUNTER — Encounter: Payer: Self-pay | Admitting: Internal Medicine

## 2022-12-28 ENCOUNTER — Ambulatory Visit: Payer: Medicare Other | Admitting: Internal Medicine

## 2022-12-28 ENCOUNTER — Ambulatory Visit: Payer: Medicare Other

## 2022-12-28 VITALS — BP 130/68 | HR 75 | Temp 98.7°F | Wt 187.8 lb

## 2022-12-28 VITALS — BP 130/72 | HR 85 | Temp 98.1°F | Ht 69.5 in | Wt 186.8 lb

## 2022-12-28 DIAGNOSIS — Z0000 Encounter for general adult medical examination without abnormal findings: Secondary | ICD-10-CM | POA: Diagnosis not present

## 2022-12-28 DIAGNOSIS — G43111 Migraine with aura, intractable, with status migrainosus: Secondary | ICD-10-CM | POA: Diagnosis not present

## 2022-12-28 DIAGNOSIS — R002 Palpitations: Secondary | ICD-10-CM | POA: Diagnosis not present

## 2022-12-28 DIAGNOSIS — T889XXA Complication of surgical and medical care, unspecified, initial encounter: Secondary | ICD-10-CM

## 2022-12-28 MED ORDER — NURTEC 75 MG PO TBDP: 1.0000 | ORAL_TABLET | Freq: Every day | ORAL | 2 refills | Status: DC | PRN

## 2022-12-28 NOTE — Progress Notes (Signed)
Subjective:   Jay Fox is a 72 y.o. male who presents for Medicare Annual/Subsequent preventive examination.  Visit Complete: In person   Cardiac Risk Factors include: advanced age (>37men, >43 women);hypertension;male gender     Objective:    Today's Vitals   12/28/22 1000  BP: 130/68  Pulse: 75  Temp: 98.7 F (37.1 C)  SpO2: 94%  Weight: 187 lb 12.8 oz (85.2 kg)   Body mass index is 27.34 kg/m.     12/28/2022   10:11 AM 09/03/2022    8:55 AM 03/03/2022    8:59 AM 12/22/2021    8:05 AM 12/02/2021    9:25 AM 06/29/2021   11:04 AM  Advanced Directives  Does Patient Have a Medical Advance Directive? Yes Yes Yes Yes Yes Yes  Type of Estate agent of [place];Living will Healthcare Power of [place];Out of facility DNR (pink MOST or yellow form);Living will Healthcare Power of [place];Out of facility DNR (pink MOST or yellow form);Living will Healthcare Power of [place];Living will Out of facility DNR (pink MOST or yellow form);Healthcare Power of [place];Living will Healthcare Power of [place];Living will;Out of facility DNR (pink MOST or yellow form)  Does patient want to make changes to medical advance directive? No - Patient declined   No - Patient declined    Copy of Healthcare Power of Attorney in Chart? Yes - validated most recent copy scanned in chart (See row information)   Yes - validated most recent copy scanned in chart (See row information)      Current Medications (verified) Outpatient Encounter Medications as of 12/28/2022  Medication Sig   albuterol (PROVENTIL) (2.5 MG/3ML) 0.083% nebulizer solution Take 3 mLs (2.5 mg total) by nebulization every 6 (six) hours as needed for wheezing or shortness of breath.   albuterol (VENTOLIN HFA) 108 (90 Base) MCG/ACT inhaler Inhale 2 puffs into the lungs every 6 (six) hours as needed for wheezing or shortness of breath.   amLODipine (NORVASC) 10 MG tablet Take 1 tablet (10 mg total) by mouth  daily.   Ascorbic Acid (VITAMIN C PO) Take 1-2 tablets by mouth 2 (two) times daily. 500mg    budesonide-formoterol (BREYNA) 160-4.5 MCG/ACT inhaler Inhale 2 puffs into the lungs 2 (two) times daily.   calcium carbonate (SUPER CALCIUM) 1500 (600 Ca) MG TABS tablet 1 tablet with meals Orally Twice a day for 30 day(s)   calcium carbonate (TUMS - DOSED IN MG ELEMENTAL CALCIUM) 500 MG chewable tablet Chew 1 tablet by mouth daily.   Calcium Carbonate-Vit D-Min (CALCIUM 1200 PO) Take 1 tablet by mouth daily.   Cholecalciferol (VITAMIN D PO) Take 1 tablet by mouth daily.   Cholecalciferol (VITAMIN D3) 50 MCG (2000 UT) TABS Take 1 tablet by mouth daily.   diclofenac Sodium (VOLTAREN) 1 % GEL Place onto the skin.   fluticasone (FLONASE) 50 MCG/ACT nasal spray Place into the nose.   irbesartan (AVAPRO) 300 MG tablet Take 1 tablet by mouth once daily   Multiple Vitamin (MULTIVITAMIN) tablet Take 1 tablet by mouth daily.   Omega-3 Fatty Acids (FISH OIL) 1200 MG CAPS Take 1,200 mg by mouth in the morning and at bedtime.   pregabalin (LYRICA) 150 MG capsule Take 1 capsule (150 mg total) by mouth at bedtime.   Respiratory Therapy Supplies MISC Filters Hoses tubing masks headgear cleaning supplies for dreamwear and  philips respironics bipap machine.   sildenafil (REVATIO) 20 MG tablet 1 tablet Orally Once a day   Spacer/Aero-Holding Chambers (AEROCHAMBER MV) inhaler  Use as directed with albuterol inhaler.   TURMERIC PO Take 1,000 mg by mouth in the morning and at bedtime.   vitamin B-12 (CYANOCOBALAMIN) 500 MCG tablet Take 1 tablet by mouth daily.   zolpidem (AMBIEN) 10 MG tablet Take 1 tablet (10 mg total) by mouth at bedtime as needed for sleep. Can NOT take with opioid pain medicine, alcohol, or other sedative.  Risk dementia/mornings impaired alertness   [DISCONTINUED] ketorolac (ACULAR) 0.5 % ophthalmic solution Place 1 drop into the left eye 4 (four) times daily.   [DISCONTINUED] ofloxacin (OCUFLOX) 0.3 %  ophthalmic solution Place 1 drop into the left eye 4 (four) times daily.   [DISCONTINUED] prednisoLONE acetate (PRED FORTE) 1 % ophthalmic suspension Place 1 drop into the left eye 4 (four) times daily.   No facility-administered encounter medications on file as of 12/28/2022.    Allergies (verified) Shellfish allergy and Shellfish-derived products   History: Past Medical History:  Diagnosis Date   Allergy 1953   Altered olfactory perception 05/31/2022   Smells cigarettes sometimes     Anxiety    Arthritis    Asthma    BPH (benign prostatic hypertrophy)    BPH (benign prostatic hypertrophy)    Cancer (HCC)    Cataract    Chronic kidney disease    Depression    GERD (gastroesophageal reflux disease)    History of kidney cancer    History of prostate cancer 11/30/2021   Hyperlipidemia 12/08/2022   Hypertension    Leg swelling 11/30/2021   Noticed early 2023 with ferritin deposition.    OSA (obstructive sleep apnea) 11/30/2021   Restless legs 11/30/2021   Seasonal allergies    Sleep apnea    Past Surgical History:  Procedure Laterality Date   NASAL SINUS SURGERY  2019   RENAL CRYOABLATION     TONSILLECTOMY     Family History  Problem Relation Age of Onset   Emphysema Mother    Liver cancer Mother    Pancreatic cancer Mother    Anxiety disorder Mother    Cancer Mother    Hypertension Mother    Multiple myeloma Father    Cancer Father    Hypertension Father    Colon cancer Paternal Grandmother    Colon cancer Paternal Grandfather    Social History   Socioeconomic History   Marital status: Married    Spouse name: Not on file   Number of children: 3   Years of education: 12   Highest education level: 12th grade  Occupational History   Occupation: Retired  Tobacco Use   Smoking status: Never   Smokeless tobacco: Never  Vaping Use   Vaping status: Never Used  Substance and Sexual Activity   Alcohol use: Yes    Alcohol/week: 10.0 standard drinks of  alcohol    Types: 10 Standard drinks or equivalent per week    Comment: Varies sometimes not at all   Drug use: No   Sexual activity: Not Currently  Other Topics Concern   Not on file  Social History Narrative   Right handed   Caffeine none   Lives in one story home   Social Determinants of Health   Financial Resource Strain: Low Risk  (12/28/2022)   Overall Financial Resource Strain (CARDIA)    Difficulty of Paying Living Expenses: Not hard at all  Food Insecurity: No Food Insecurity (12/28/2022)   Hunger Vital Sign    Worried About Running Out of Food in the Last Year: Never  true    Ran Out of Food in the Last Year: Never true  Transportation Needs: No Transportation Needs (12/28/2022)   PRAPARE - Administrator, Civil Service (Medical): No    Lack of Transportation (Non-Medical): No  Physical Activity: Sufficiently Active (12/28/2022)   Exercise Vital Sign    Days of Exercise per Week: 7 days    Minutes of Exercise per Session: 40 min  Stress: Stress Concern Present (12/28/2022)   [employer] of Occupational Health - Occupational Stress Questionnaire    Feeling of Stress : To some extent  Social Connections: Socially Integrated (12/28/2022)   Social Connection and Isolation Panel [NHANES]    Frequency of Communication with Friends and Family: More than three times a week    Frequency of Social Gatherings with Friends and Family: Twice a week    Attends Religious Services: More than 4 times per year    Active Member of [employer] or Organizations: Yes    Attends Engineer, structural: More than 4 times per year    Marital Status: Married    Tobacco Counseling Counseling given: Not Answered   Clinical Intake:  Pre-visit preparation completed: Yes  Pain : No/denies pain     BMI - recorded: 27.34 Nutritional Status: BMI 25 -29 Overweight Diabetes: No  How often do you need to have someone help you when you read instructions, pamphlets, or  other written materials from your doctor or pharmacy?: 1 - Never  Interpreter Needed?: No  Information entered by :: Lanier Ensign, LPN   Activities of Daily Living    12/28/2022   10:05 AM  In your present state of health, do you have any difficulty performing the following activities:  Hearing? 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 1  Comment trouble seeing well per pt after cataract surgery  Difficulty concentrating or making decisions? 0  Walking or climbing stairs? 0  Dressing or bathing? 0  Doing errands, shopping? 0  Preparing Food and eating ? N  Using the Toilet? N  In the past six months, have you accidently leaked urine? Y  Comment wears a pad  Do you have problems with loss of bowel control? N  Managing your Medications? N  Managing your Finances? N  Housekeeping or managing your Housekeeping? N    Patient Care Team: Lula Olszewski, MD as PCP - General (Internal Medicine) Storm Frisk, MD as Attending Physician (Pulmonary Disease) Antony Madura, MD as Consulting Physician (Neurology)  Indicate any recent Medical Services you may have received from other than [facility] providers in the past year (date may be approximate).     Assessment:   This is a routine wellness examination for [place].  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  Vision Screening - Comments:: Pt follows up with Dr Dione Booze for annual eye exams    Goals Addressed             This Visit's Progress    Patient Stated       Get in better health        Depression Screen    12/28/2022   10:11 AM 12/08/2022   10:00 AM 05/31/2022    9:08 AM 12/22/2021    8:03 AM 11/30/2021    3:07 PM 06/29/2021   11:09 AM 05/14/2021    9:52 AM  PHQ 2/9 Scores  PHQ - 2 Score 1 0 2 0 0 0 0  PHQ-  9 Score 8 6 4   2  0    Fall Risk    12/08/2022   10:00 AM 09/03/2022    8:55 AM 05/31/2022    9:08 AM 03/03/2022    8:59 AM 12/22/2021    8:06 AM  Fall Risk   Falls  in the past year? 0 0 0 0 0  Number falls in past yr: 0 0 0 0 0  Injury with Fall? 0 0 0 0 0  Risk for fall due to : No Fall Risks  No Fall Risks  Impaired vision  Follow up Falls evaluation completed Falls evaluation completed Falls evaluation completed Falls evaluation completed Falls prevention discussed    MEDICARE RISK AT HOME: Medicare Risk at Home Any stairs in or around the home?: No If so, are there any without handrails?: No Home free of loose throw rugs in walkways, pet beds, electrical cords, etc?: Yes Adequate lighting in your home to reduce risk of falls?: Yes Life alert?: No Use of a cane, walker or w/c?: No Grab bars in the bathroom?: Yes Shower chair or bench in shower?: No Elevated toilet seat or a handicapped toilet?: No  TIMED UP AND GO:  Was the test performed?  Yes  Length of time to ambulate 10 feet: 10 sec Gait steady and fast without use of assistive device    Cognitive Function:    12/28/2022   10:16 AM  MMSE - Mini Mental State Exam  Not completed: Unable to complete        12/22/2021    8:07 AM  6CIT Screen  What Year? 0 points  What month? 0 points  What time? 0 points  Count back from 20 0 points  Months in reverse 0 points  Repeat phrase 0 points  Total Score 0 points    Immunizations Immunization History  Administered Date(s) Administered   Fluad Quad(high Dose 65+) 11/21/2019   Influenza Whole 12/11/2010   Influenza, High Dose Seasonal PF 12/01/2022   Influenza,inj,Quad PF,6+ Mos 12/01/2021   Influenza,trivalent, recombinat, inj, PF 12/11/2010, 12/28/2011, 12/15/2012   Influenza-Unspecified 12/01/2021   Moderna Covid-19 Fall Seasonal Vaccine 12yrs & older 02/18/2022, 12/01/2022   Moderna Covid-19 Vaccine Bivalent Booster 76yrs & up 05/15/2019, 06/12/2019, 01/19/2021   Moderna SARS-COV2 Booster Vaccination 01/16/2020, 07/10/2020, 01/19/2021   Pneumococcal Polysaccharide-23 12/28/2011   Pneumococcal-Unspecified 07/04/2017    Tdap 07/05/2014, 05/12/2017   Zoster, Live 07/03/2014   Zoster, Unspecified 05/15/2017, 08/13/2017    TDAP status: Up to date  Flu Vaccine status: Up to date  Pneumococcal vaccine status: Due, Education has been provided regarding the importance of this vaccine. Advised may receive this vaccine at local pharmacy or Health Dept. Aware to provide a copy of the vaccination record if obtained from local pharmacy or Health Dept. Verbalized acceptance and understanding.  Covid-19 vaccine status: Completed vaccines  Qualifies for Shingles Vaccine? Yes   Zostavax completed No   Shingrix Completed?: No.    Education has been provided regarding the importance of this vaccine. Patient has been advised to call insurance company to determine out of pocket expense if they have not yet received this vaccine. Advised may also receive vaccine at local pharmacy or Health Dept. Verbalized acceptance and understanding.  Screening Tests Health Maintenance  Topic Date Due   Zoster Vaccines- Shingrix (1 of 2) 02/08/2023 (Originally 07/11/1969)   Pneumonia Vaccine 11+ Years old (2 of 2 - PCV) 02/09/2023 (Originally 07/05/2018)   Medicare Annual Wellness (AWV)  12/28/2023  DTaP/Tdap/Td (3 - Td or Tdap) 05/13/2027   Colonoscopy  06/06/2032   INFLUENZA VACCINE  Completed   COVID-19 Vaccine  Completed   Hepatitis C Screening  Completed   HPV VACCINES  Aged Out    Health Maintenance  There are no preventive care reminders to display for this patient.   Pt stated colonoscopy completed at [facility] and cologuard completed 06/07/22 negative results    Additional Screening:  Hepatitis C Screening: Completed 05/14/21  Vision Screening: Recommended annual ophthalmology exams for early detection of glaucoma and other disorders of the eye. Is the patient up to date with their annual eye exam?  Yes  Who is the provider or what is the name of the office in which the patient attends annual eye exams? Dr Dione Booze  If pt  is not established with a provider, would they like to be referred to a provider to establish care? No .   Dental Screening: Recommended annual dental exams for proper oral hygiene   Community Resource Referral / Chronic Care Management: CRR required this visit?  No   CCM required this visit?  No     Plan:     I have personally reviewed and noted the following in the patient's chart:   Medical and social history Use of alcohol, tobacco or illicit drugs  Current medications and supplements including opioid prescriptions. Patient is not currently taking opioid prescriptions. Functional ability and status Nutritional status Physical activity Advanced directives List of other physicians Hospitalizations, surgeries, and ER visits in previous 12 months Vitals Screenings to include cognitive, depression, and falls Referrals and appointments  In addition, I have reviewed and discussed with patient certain preventive protocols, quality metrics, and best practice recommendations. A written personalized care plan for preventive services as well as general preventive health recommendations were provided to patient.     Marzella Schlein, LPN   [id number]/29/5621   After Visit Summary: (MyChart) Due to this being a telephonic visit, the after visit summary with patients personalized plan was offered to patient via MyChart   Nurse Notes: none

## 2022-12-28 NOTE — Progress Notes (Signed)
[facility]: [phone]   -- Medical Office Visit --  Patient:  Jay Fox      Age: 72 y.o.       Sex:  male  Date:   12/28/2022 Patient Care Team: Lula Olszewski, MD as PCP - General (Internal Medicine) Storm Frisk, MD as Attending Physician (Pulmonary Disease) Antony Madura, MD as Consulting Physician (Neurology) Today's Healthcare Provider: Lula Olszewski, MD   Assessment & Plan Intractable migraine with aura with status migrainosus  New-onset migraine headaches    - Neurology referral for evaluation and management    - Consider CT or MRI of brain to rule out secondary causes- if eye appointment unrevealing.    - Prescribe acute migraine treatment (e.g., sumatriptan, contraindicate, so try Nurtec)    - Recommend migraine diary to track triggers and frequency   Medication review    - Evaluate potential medication interactions or side effects    - Consider tapering or adjusting doses of current medications    - Assess need for anxiety management (non-pharmacological and pharmacological options)   Lifestyle modifications    - Recommend blue light filters for all screens    - Advise on proper lighting ergonomics at home and work    - Encourage regular sleep schedule and sleep hygiene practices    - Suggest stress reduction techniques (e.g., meditation, deep breathing exercises)  Follow-up    - Schedule follow-up appointment in 1-2 weeks to review specialist findings and treatment response    - Provide patient education on red flag symptoms requiring immediate medical attention Complication due to medical care, initial encounter  He exhibits severe light sensitivity and blurred vision since undergoing cataract surgeries in September and October, alongside recent severe, throbbing, biparietal headaches exacerbated by artificial light. The headaches, alleviated by darkness, coupled with small pupil size in dark settings, suggest possible  glaucoma. We will urgently refer him to an ophthalmologist for a thorough examination, including eye pressure checks and endophthalmitis screening. He will resume eye drops (Ketorolac, Ofloxacin/Ocuflox, Prednisolone) discontinued last week. We will consider migraine medication, though its effectiveness may be limited due to the likely eye-related headache cause. Post-cataract surgery complications    - Obtain records from ophthalmologist    - Urgent ophthalmology referral for evaluation of:      a) Extreme light sensitivity      b) Visual disturbances (left eye dysfunction, flashes, floaters)    - Consider possibility of endophthalmitis, retinal detachment, or other post-surgical complications like glaucoma.  Heart palpitations Heart palpitations and chronic JVD    - Expedite cardiology appointment    - Consider echocardiogram to evaluate cardiac function and JVD etiology    - Review current antihypertensive regimen (amlodipine, irbesartan) no obvious cause.   Diagnoses and all orders for this visit: Intractable migraine with aura with status migrainosus -     Rimegepant Sulfate (NURTEC) 75 MG TBDP; Take 1 tablet (75 mg total) by mouth daily as needed.  Recommended follow-up: No follow-ups on file. Future Appointments  Date Time Provider Department Center  01/18/2023 11:00 AM Lula Olszewski, MD [facility]  01/27/2023 10:40 AM Lennette Bihari, MD [facility] None  03/04/2023 10:30 AM Antony Madura, MD [facility] None  01/03/2024 10:00 AM [facility] ANNUAL WELLNESS VISIT 1 [facility]    Medical Decision Making: 1 or more chronic illnesses with exacerbation,  progression, or side effects of treatment 1 undiagnosed new problem with uncertain prognosis 1 acute complicated injury Prescription drug  management  Decision regarding minor surgery with identified patient or procedure risk factors       Subjective   72 y.o. male who has Asthma; Hypertension; Seasonal allergies; Urinary  incontinence; Skin lesions, generalized; Prostate cancer (HCC); Insomnia; Heart palpitations; Benign essential hypertension; Asthma, moderate persistent; History of renal cell carcinoma; Personal history of prostate cancer; History of radical prostatectomy; Leg swelling; Restless leg syndrome; Prediabetes; OSA (obstructive sleep apnea); Back wound; Generalized abdominal pain; High risk medication use; Abdominal distension; Altered olfactory perception; Jugular venous distension; Cervical lymphadenopathy; and Hyperlipidemia on their problem list. His reasons/main concerns/chief complaints for today's office visit are Insomnia (Pt would like refill on Ambien), Anxiety (C/o of anxiety w/ headaches, sensitivity to light), and Hypertension (Refill needed for Avapro/)   ------------------------------------------------------------------------------------------------------------------------ AI-Extracted: Discussed the use of AI scribe software for clinical note transcription with the patient, who gave verbal consent to proceed.  History of Present Illness   The patient, a 72 year old individual with a history of recent cataract surgeries, presents with a one-week history of severe, throbbing, biparietal headaches. These headaches reportedly started after discontinuation of post-operative eye drops. The patient describes the headaches as similar to migraines experienced in the past, with symptoms subsiding when in a dark environment or away from the computer. The headaches have been severe at times, with the patient rating the pain as 10 out of 10, and occurring approximately 50% of the time in the last few days. Over-the-counter medications such as Tylenol and ibuprofen have been tried with little relief.  The patient also reports extreme sensitivity to artificial light, particularly from computers and indoor fluorescent lights, which has been ongoing since the cataract surgeries. Sunlight does not seem to  exacerbate the symptoms. The patient notes that the left side of the left eye has had poor vision since the first cataract surgery, and there have been occasional flashes of light and floaters in the left eye.  In addition to the headaches and light sensitivity, the patient has noticed an increase in heart palpitations, which he describes as feeling like anxiety. The palpitations seem to subside when the patient is in a dark environment or away from the computer. The patient also reports increased fatigue since the summer, prior to the eye surgeries.  The patient's sleep patterns have been affected, with increased insomnia and the need to take a full dose of Ambien more frequently. 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 The patient also reports a long-standing history of tinnitus, which has worsened with the current symptoms.      # History of Present Illness (HPI) manually produced:  72 year old male presents with a one-week history of severe migraine headaches following discontinuation of post-cataract surgery eye drops. The patient underwent cataract surgery in both eyes (left eye in September 2024, right eye in October 2024). He reports the following symptoms and history:  - Severe migraine headaches (10/10 pain, occurring 50% of the time)   - Throbbing, parietal headaches   - Subside in darkness, exacerbated by indoor and computer lights   - No associated nausea, vomiting, or sound sensitivity   - Migraines resolve completely in dark environments  - Extreme light sensitivity   - Constant for artificial light   - Equal in both eyes   - Began in left eye after right eye surgery   - More sensitive to computer and indoor fluorescent lights than sunlight  -  Visual disturbances   - Left side of left eye not functioning well since surgery   - Flashes of light and  floaters in the left eye  - Heart palpitations   - Worsened since onset of migraines   - Feel like hard pumping mid-chest, sometimes with left precordial pain   - Associated with anxiety   - Subside when in bed  - Chronic jugular venous distension (JVD)   - Planning to see cardiologist  - Tinnitus   - Long-standing, but recently flared  The patient tried ibuprofen after Tylenol failed to provide relief. Blue light glasses for computer use did not help. He discontinued eye drops (ketorolac 0.5%, ofloxacin 0.3%, and prednisolone acetate 1%) one week ago, which coincided with the onset of migraines.  Sleep patterns remain consistent, using zolpidem (Ambien) as needed, recently requiring full dose due to migraines and anxiety.   He has a past medical history of Allergy (1953), Altered olfactory perception (05/31/2022), Anxiety, Arthritis, Asthma, BPH (benign prostatic hypertrophy), BPH (benign prostatic hypertrophy), Cancer (HCC), Cataract, Chronic kidney disease, Depression, GERD (gastroesophageal reflux disease), History of kidney cancer, History of prostate cancer (11/30/2021), Hyperlipidemia (12/08/2022), Hypertension, Leg swelling (11/30/2021), OSA (obstructive sleep apnea) (11/30/2021), Restless legs (11/30/2021), Seasonal allergies, and Sleep apnea.  Problem list overviews that were updated at today's visit: No problems updated. Current Outpatient Medications on File Prior to Visit  Medication Sig   albuterol (PROVENTIL) (2.5 MG/3ML) 0.083% nebulizer solution Take 3 mLs (2.5 mg total) by nebulization every 6 (six) hours as needed for wheezing or shortness of breath.   albuterol (VENTOLIN HFA) 108 (90 Base) MCG/ACT inhaler Inhale 2 puffs into the lungs every 6 (six) hours as needed for wheezing or shortness of breath.   amLODipine (NORVASC) 10 MG tablet Take 1 tablet (10 mg total) by mouth daily.   Ascorbic Acid (VITAMIN C PO) Take 1-2 tablets by mouth 2 (two) times daily. 500mg     budesonide-formoterol (BREYNA) 160-4.5 MCG/ACT inhaler Inhale 2 puffs into the lungs 2 (two) times daily.   calcium carbonate (SUPER CALCIUM) 1500 (600 Ca) MG TABS tablet 1 tablet with meals Orally Twice a day for 30 day(s)   calcium carbonate (TUMS - DOSED IN MG ELEMENTAL CALCIUM) 500 MG chewable tablet Chew 1 tablet by mouth daily.   Calcium Carbonate-Vit D-Min (CALCIUM 1200 PO) Take 1 tablet by mouth daily.   Cholecalciferol (VITAMIN D PO) Take 1 tablet by mouth daily.   Cholecalciferol (VITAMIN D3) 50 MCG (2000 UT) TABS Take 1 tablet by mouth daily.   diclofenac Sodium (VOLTAREN) 1 % GEL Place onto the skin.   fluticasone (FLONASE) 50 MCG/ACT nasal spray Place into the nose.   irbesartan (AVAPRO) 300 MG tablet Take 1 tablet by mouth once daily   Multiple Vitamin (MULTIVITAMIN) tablet Take 1 tablet by mouth daily.   Omega-3 Fatty Acids (FISH OIL) 1200 MG CAPS Take 1,200 mg by mouth in the morning and at bedtime.   pregabalin (LYRICA) 150 MG capsule Take 1 capsule (150 mg total) by mouth at bedtime.   Respiratory Therapy Supplies MISC Filters Hoses tubing masks headgear cleaning supplies for dreamwear and  [employer] respironics bipap machine.   sildenafil (REVATIO) 20 MG tablet 1 tablet Orally Once a day   Spacer/Aero-Holding Chambers (AEROCHAMBER MV) inhaler Use as directed with albuterol inhaler.   TURMERIC PO Take 1,000 mg by mouth in the morning and at bedtime.   vitamin B-12 (CYANOCOBALAMIN) 500 MCG tablet Take 1 tablet by mouth daily.  zolpidem (AMBIEN) 10 MG tablet Take 1 tablet (10 mg total) by mouth at bedtime as needed for sleep. Can NOT take with opioid pain medicine, alcohol, or other sedative.  Risk dementia/mornings impaired alertness   No current facility-administered medications on file prior to visit.   Medications Discontinued During This Encounter  Medication Reason   budesonide-formoterol (BREYNA) 160-4.5 MCG/ACT inhaler      Objective   Physical Exam  BP 130/72    Pulse 85   Temp 98.1 F (36.7 C)   Ht 5' 9.5" (1.765 m)   Wt 186 lb 12.8 oz (84.7 kg)   SpO2 93%   BMI 27.19 kg/m  Wt Readings from Last 10 Encounters:  12/28/22 186 lb 12.8 oz (84.7 kg)  12/28/22 187 lb 12.8 oz (85.2 kg)  12/08/22 188 lb 2 oz (85.3 kg)  09/22/22 187 lb 3.2 oz (84.9 kg)  09/03/22 186 lb (84.4 kg)  05/31/22 185 lb 12.8 oz (84.3 kg)  03/29/22 182 lb (82.6 kg)  03/03/22 179 lb 3.2 oz (81.3 kg)  01/04/22 177 lb (80.3 kg)  12/22/21 176 lb (79.8 kg)   Vital signs reviewed.  Nursing notes reviewed. Weight trend reviewed. Abnormalities and Problem-Specific physical exam findings:  fixed mid dilated pupils. Very sensitive to light so room kept dark.  Not sound sensitive.  General Appearance:  No acute distress appreciable.   Well-groomed, healthy-appearing male.  Well proportioned with no abnormal fat distribution.  Good muscle tone. Pulmonary:  Normal work of breathing at rest, no respiratory distress apparent. SpO2: 93 %  Musculoskeletal: All extremities are intact.  Neurological:  Awake, alert, oriented, and engaged.  No obvious focal neurological deficits or cognitive impairments.  Sensorium seems unclouded.   Speech is clear and coherent with logical content. Psychiatric:  Appropriate mood, pleasant and cooperative demeanor, thoughtful and engaged during the exam  Results   DIAGNOSTIC Jugular venous distention: Observed        Results for orders placed or performed in visit on 12/28/22  Cologuard  Result Value Ref Range   Cologuard Negative Negative    Clinical Support on 12/28/2022  Component Date Value   Cologuard 06/07/2022 Negative   Office Visit on 12/08/2022  Component Date Value   WBC 12/08/2022 6.0    RBC 12/08/2022 4.89    Platelets 12/08/2022 240.0    Hemoglobin 12/08/2022 14.2    HCT 12/08/2022 42.9    MCV 12/08/2022 87.8    MCHC 12/08/2022 33.1    RDW 12/08/2022 14.1    Sodium 12/08/2022 141    Potassium 12/08/2022 4.1    Chloride  12/08/2022 105    CO2 12/08/2022 27    Glucose, Bld 12/08/2022 109 (H)    BUN 12/08/2022 17    Creatinine, Ser 12/08/2022 1.02    Total Bilirubin 12/08/2022 0.7    Alkaline Phosphatase 12/08/2022 67    AST 12/08/2022 18    ALT 12/08/2022 21    Total Protein 12/08/2022 7.1    Albumin 12/08/2022 4.5    GFR 12/08/2022 73.99    Calcium 12/08/2022 9.9    Cholesterol 12/08/2022 134    Triglycerides 12/08/2022 227.0 (H)    HDL 12/08/2022 50.90    VLDL 12/08/2022 45.4 (H)    LDL Cholesterol 12/08/2022 38    Total CHOL/HDL Ratio 12/08/2022 3    NonHDL 12/08/2022 83.39    TSH 12/08/2022 2.28    Hgb A1c MFr Bld 12/08/2022 5.9   Office Visit on 05/31/2022  Component Date Value  COLOGUARD 06/07/2022 Negative   Orders Only on 04/07/2022  Component Date Value   Total Protein, Urine 04/07/2022 7.9    Total Protein, Urine-Ur/* 04/07/2022 122    Albumin, U 04/07/2022 50.3    ALPHA 1 URINE 04/07/2022 9.4    Alpha 2, Urine 04/07/2022 11.9    % BETA, Urine 04/07/2022 23.1    GAMMA GLOBULIN URINE 04/07/2022 5.4    Free Kappa Lt Chains,Ur 04/07/2022 12.36    Free Lambda Lt Chains,Ur 04/07/2022 1.76    Free Kappa/Lambda Ratio 04/07/2022 7.02    Immunofixation Result, U* 04/07/2022 Comment    Total Volume 04/07/2022 1,550    M-SPIKE %, Urine 04/07/2022 Not Observed    Note: 04/07/2022 Comment   Appointment on 04/05/2022  Component Date Value   LDH 04/05/2022 131    Beta-2 Microglobulin 04/05/2022 1.5    Kappa free light chain 04/05/2022 18.5    Lambda free light chains 04/05/2022 14.1    Kappa, lambda light chai* 04/05/2022 1.31    IgG (Immunoglobin G), Se* 04/05/2022 787    IgA 04/05/2022 149    IgM (Immunoglobulin M), * 04/05/2022 88    Total Protein ELP 04/05/2022 6.6    Albumin SerPl Elph-Mcnc 04/05/2022 3.8    Alpha 1 04/05/2022 0.2    Alpha2 Glob SerPl Elph-M* 04/05/2022 0.7    B-Globulin SerPl Elph-Mc* 04/05/2022 1.0    Gamma Glob SerPl Elph-Mc* 04/05/2022 0.9    M Protein  SerPl Elph-Mcnc 04/05/2022 Not Observed    Globulin, Total 04/05/2022 2.8    Albumin/Glob SerPl 04/05/2022 1.4    IFE 1 04/05/2022 Comment    Please Note 04/05/2022 Comment    Sodium 04/05/2022 140    Potassium 04/05/2022 4.2    Chloride 04/05/2022 106    CO2 04/05/2022 28    Glucose, Bld 04/05/2022 110 (H)    BUN 04/05/2022 19    Creatinine 04/05/2022 0.97    Calcium 04/05/2022 10.2    Total Protein 04/05/2022 7.0    Albumin 04/05/2022 4.4    AST 04/05/2022 15    ALT 04/05/2022 20    Alkaline Phosphatase 04/05/2022 69    Total Bilirubin 04/05/2022 0.5    GFR, Estimated 04/05/2022 >60    Anion gap 04/05/2022 6    WBC Count 04/05/2022 5.8    RBC 04/05/2022 4.96    Hemoglobin 04/05/2022 14.7    HCT 04/05/2022 43.7    MCV 04/05/2022 88.1    MCH 04/05/2022 29.6    MCHC 04/05/2022 33.6    RDW 04/05/2022 13.1    Platelet Count 04/05/2022 220    nRBC 04/05/2022 0.0    Neutrophils Relative % 04/05/2022 62    Neutro Abs 04/05/2022 3.6    Lymphocytes Relative 04/05/2022 20    Lymphs Abs 04/05/2022 1.1    Monocytes Relative 04/05/2022 11    Monocytes Absolute 04/05/2022 0.6    Eosinophils Relative 04/05/2022 6    Eosinophils Absolute 04/05/2022 0.4    Basophils Relative 04/05/2022 1    Basophils Absolute 04/05/2022 0.1    Immature Granulocytes 04/05/2022 0    Abs Immature Granulocytes 04/05/2022 0.02   Office Visit on 03/29/2022  Component Date Value   Amylase 03/29/2022 50    Lipase 03/29/2022 20.0    PSA 03/29/2022 0.00 Repeated and verified X2. (L)    Sodium 03/29/2022 143    Potassium 03/29/2022 4.7    Chloride 03/29/2022 105    CO2 03/29/2022 30    Glucose, Bld 03/29/2022 111 (H)  BUN 03/29/2022 20    Creatinine, Ser 03/29/2022 0.98    Total Bilirubin 03/29/2022 0.5    Alkaline Phosphatase 03/29/2022 76    AST 03/29/2022 15    ALT 03/29/2022 19    Total Protein 03/29/2022 6.8    Albumin 03/29/2022 4.5    GFR 03/29/2022 78.01    Calcium 03/29/2022 10.1    WBC  03/29/2022 6.6    RBC 03/29/2022 4.89    Platelets 03/29/2022 226.0    Hemoglobin 03/29/2022 14.5    HCT 03/29/2022 43.3    MCV 03/29/2022 88.6    MCHC 03/29/2022 33.4    RDW 03/29/2022 13.6   Lab on 03/03/2022  Component Date Value   Immunofix Electr Int 03/03/2022     Immunoglobulin A 03/03/2022 151    IgG (Immunoglobin G), Se* 03/03/2022 836    IgM, Serum 03/03/2022 92    No image results found.   No results found.  CT Abdomen Pelvis W Contrast  Result Date: 03/30/2022 CLINICAL DATA:  Abdominal pain for 1 month which radiates to the left. Irregular bowel movements. History of prostate cancer and renal cell carcinoma. EXAM: CT ABDOMEN AND PELVIS WITH CONTRAST TECHNIQUE: Multidetector CT imaging of the abdomen and pelvis was performed using the standard protocol following bolus administration of intravenous contrast. RADIATION DOSE REDUCTION: This exam was performed according to the departmental dose-optimization program which includes automated exposure control, adjustment of the mA and/or kV according to patient size and/or use of iterative reconstruction technique. CONTRAST:  85mL OMNIPAQUE IOHEXOL 300 MG/ML  SOLN COMPARISON:  11/13/2021 FINDINGS: Lower chest: No pleural fluid or airspace disease. Scarring within the right middle lobe and lingula is again seen. Hepatobiliary: Small hypodense lesion within segment 8/4 is unchanged measuring 5 mm. No suspicious liver lesions identified on today's study. Gallbladder appears normal. No bile duct dilatation. Pancreas: Unremarkable. No pancreatic ductal dilatation or surrounding inflammatory changes. Spleen: Normal in size without focal abnormality. Adrenals/Urinary Tract: Normal adrenal glands. Stable postoperative change from partial nephrectomy off the upper pole of the left kidney. Bilateral kidney cysts are again seen in appears stable in the interval. The largest arises off the medial cortex of the left kidney measuring 3.3 cm. No  nephrolithiasis or hydronephrosis. Urinary bladder appears normal. Stomach/Bowel: Stomach is within normal limits. The appendix is visualized and appears normal. Sigmoid diverticulosis without signs of acute diverticulitis. No bowel wall thickening or inflammation. No pathologic dilatation of the large or small bowel loops. Vascular/Lymphatic: Normal appearance of the abdominal aorta. No enlarged abdominopelvic adenopathy. Reproductive: Status post prostatectomy. Other: No free fluid or fluid collections. Small fat containing umbilical hernia. No signs of pneumoperitoneum. Musculoskeletal: Mild multilevel lumbar spondylosis. No acute or suspicious osseous findings. IMPRESSION: 1. No acute findings within the abdomen or pelvis. 2. Sigmoid diverticulosis without signs of acute diverticulitis. 3. Status post partial nephrectomy off the upper pole of the left kidney. No signs of recurrent or metastatic disease. 4. Status post prostatectomy. Electronically Signed   By: Signa Kell M.D.   On: 03/30/2022 09:57       Additional Info: This encounter employed real-time, collaborative documentation. The patient actively reviewed and updated their medical record on a shared screen, ensuring transparency and facilitating joint problem-solving for the problem list, overview, and plan. This approach promotes accurate, informed care. The treatment plan was discussed and reviewed in detail, including medication safety, potential side effects, and all patient questions. We confirmed understanding and comfort with the plan. Follow-up instructions were established, including contacting  the office for any concerns, returning if symptoms worsen, persist, or new symptoms develop, and precautions for potential emergency department visits.

## 2022-12-28 NOTE — Patient Instructions (Signed)
Jay Fox , Thank you for taking time to come for your Medicare Wellness Visit. I appreciate your ongoing commitment to your health goals. Please review the following plan we discussed and let me know if I can assist you in the future.   Referrals/Orders/Follow-Ups/Clinician Recommendations: Aim for 30 minutes of exercise or brisk walking, 6-8 glasses of water, and 5 servings of fruits and vegetables each day.   This is a list of the screening recommended for you and due dates:  Health Maintenance  Topic Date Due   Colon Cancer Screening  Never done   Medicare Annual Wellness Visit  12/23/2022   Zoster (Shingles) Vaccine (1 of 2) 02/08/2023*   Pneumonia Vaccine (2 of 2 - PCV) 02/09/2023*   DTaP/Tdap/Td vaccine (3 - Td or Tdap) 05/13/2027   Flu Shot  Completed   COVID-19 Vaccine  Completed   Hepatitis C Screening  Completed   HPV Vaccine  Aged Out  *Topic was postponed. The date shown is not the original due date.    Advanced directives: (In Chart) A copy of your advanced directives are scanned into your chart should your provider ever need it.  Next Medicare Annual Wellness Visit scheduled for next year: Yes

## 2022-12-28 NOTE — Assessment & Plan Note (Signed)
Heart palpitations and chronic JVD    - Expedite cardiology appointment    - Consider echocardiogram to evaluate cardiac function and JVD etiology    - Review current antihypertensive regimen (amlodipine, irbesartan) no obvious cause.

## 2022-12-28 NOTE — Patient Instructions (Signed)
#   After Visit Summary   Thank you for coming in today. We addressed your concerns about severe migraines, light sensitivity, and heart palpitations following your recent cataract surgeries. Here's a summary of our plan:  1. Urgent ophthalmology referral to evaluate your eyes and vision changes. We arranged for you to go directly to Dr. Laruth Bouchard office 2. Neurology referral for migraine management can be considered if Dr. Dione Booze cannot help and the Nurtec I wrote doesn't help 3. Cardiology appointment (already planned) for heart palpitations and JVD. 4. New prescriptions:    - [Migraine prophylaxis medication]    - [Acute migraine treatment] 5. Tests ordered:    - [Brain imaging study]    - ECG    - 24-hour Holter monitor    - Possible echocardiogram (to be determined by cardiologist)  Lifestyle recommendations: - Use blue light filters on all screens - Optimize lighting at home and work - Maintain a regular sleep schedule - Practice stress reduction techniques  Please start a migraine diary to track your headaches, triggers, and medication effectiveness.  Red flag symptoms requiring immediate medical attention: - Sudden, severe headache unlike any previous headache - Visual loss or double vision - Weakness or numbness on one side of the body - Difficulty speaking or understanding speech - Chest pain or shortness of breath

## 2022-12-29 NOTE — Progress Notes (Signed)
Check on patient please.  I thought for sure he had complication of procedure- but eye Dr.. evaluation was pretty normal.   See if he tried the Nurtec I sent for his headache(s). Inform the patient that if he is yet to try and still having headache(s) to give it a try.

## 2022-12-30 NOTE — Progress Notes (Signed)
Subjective:   Jay Fox is a 72 y.o. male who presents for Medicare Annual/Subsequent preventive examination.  Visit Complete: In person   Cardiac Risk Factors include: advanced age (>33men, >35 women);hypertension;male gender     Objective:    Today's Vitals   12/28/22 1000  BP: 130/68  Pulse: 75  Temp: 98.7 F (37.1 C)  SpO2: 94%  Weight: 187 lb 12.8 oz (85.2 kg)   Body mass index is 27.34 kg/m.     12/28/2022   10:11 AM 09/03/2022    8:55 AM 03/03/2022    8:59 AM 12/22/2021    8:05 AM 12/02/2021    9:25 AM 06/29/2021   11:04 AM  Advanced Directives  Does Patient Have a Medical Advance Directive? Yes Yes Yes Yes Yes Yes  Type of Estate agent of [place];Living will Healthcare Power of [place];Out of facility DNR (pink MOST or yellow form);Living will Healthcare Power of [place];Out of facility DNR (pink MOST or yellow form);Living will Healthcare Power of [place];Living will Out of facility DNR (pink MOST or yellow form);Healthcare Power of [place];Living will Healthcare Power of [place];Living will;Out of facility DNR (pink MOST or yellow form)  Does patient want to make changes to medical advance directive? No - Patient declined   No - Patient declined    Copy of Healthcare Power of Attorney in Chart? Yes - validated most recent copy scanned in chart (See row information)   Yes - validated most recent copy scanned in chart (See row information)      Current Medications (verified) Outpatient Encounter Medications as of 12/28/2022  Medication Sig   albuterol (PROVENTIL) (2.5 MG/3ML) 0.083% nebulizer solution Take 3 mLs (2.5 mg total) by nebulization every 6 (six) hours as needed for wheezing or shortness of breath.   albuterol (VENTOLIN HFA) 108 (90 Base) MCG/ACT inhaler Inhale 2 puffs into the lungs every 6 (six) hours as needed for wheezing or shortness of breath.   amLODipine (NORVASC) 10 MG tablet Take 1 tablet (10 mg total) by mouth  daily.   Ascorbic Acid (VITAMIN C PO) Take 1-2 tablets by mouth 2 (two) times daily. 500mg    budesonide-formoterol (BREYNA) 160-4.5 MCG/ACT inhaler Inhale 2 puffs into the lungs 2 (two) times daily.   calcium carbonate (SUPER CALCIUM) 1500 (600 Ca) MG TABS tablet 1 tablet with meals Orally Twice a day for 30 day(s)   calcium carbonate (TUMS - DOSED IN MG ELEMENTAL CALCIUM) 500 MG chewable tablet Chew 1 tablet by mouth daily.   Calcium Carbonate-Vit D-Min (CALCIUM 1200 PO) Take 1 tablet by mouth daily.   Cholecalciferol (VITAMIN D PO) Take 1 tablet by mouth daily.   Cholecalciferol (VITAMIN D3) 50 MCG (2000 UT) TABS Take 1 tablet by mouth daily.   diclofenac Sodium (VOLTAREN) 1 % GEL Place onto the skin.   fluticasone (FLONASE) 50 MCG/ACT nasal spray Place into the nose.   irbesartan (AVAPRO) 300 MG tablet Take 1 tablet by mouth once daily   Multiple Vitamin (MULTIVITAMIN) tablet Take 1 tablet by mouth daily.   Omega-3 Fatty Acids (FISH OIL) 1200 MG CAPS Take 1,200 mg by mouth in the morning and at bedtime.   pregabalin (LYRICA) 150 MG capsule Take 1 capsule (150 mg total) by mouth at bedtime.   Respiratory Therapy Supplies MISC Filters Hoses tubing masks headgear cleaning supplies for dreamwear and  philips respironics bipap machine.   sildenafil (REVATIO) 20 MG tablet 1 tablet Orally Once a day   Spacer/Aero-Holding Chambers (AEROCHAMBER MV) inhaler  Use as directed with albuterol inhaler.   TURMERIC PO Take 1,000 mg by mouth in the morning and at bedtime.   vitamin B-12 (CYANOCOBALAMIN) 500 MCG tablet Take 1 tablet by mouth daily.   zolpidem (AMBIEN) 10 MG tablet Take 1 tablet (10 mg total) by mouth at bedtime as needed for sleep. Can NOT take with opioid pain medicine, alcohol, or other sedative.  Risk dementia/mornings impaired alertness   [DISCONTINUED] ketorolac (ACULAR) 0.5 % ophthalmic solution Place 1 drop into the left eye 4 (four) times daily.   [DISCONTINUED] ofloxacin (OCUFLOX) 0.3 %  ophthalmic solution Place 1 drop into the left eye 4 (four) times daily.   [DISCONTINUED] prednisoLONE acetate (PRED FORTE) 1 % ophthalmic suspension Place 1 drop into the left eye 4 (four) times daily.   No facility-administered encounter medications on file as of 12/28/2022.    Allergies (verified) Shellfish allergy and Shellfish-derived products   History: Past Medical History:  Diagnosis Date   Allergy 1953   Altered olfactory perception 05/31/2022   Smells cigarettes sometimes     Anxiety    Arthritis    Asthma    BPH (benign prostatic hypertrophy)    BPH (benign prostatic hypertrophy)    Cancer (HCC)    Cataract    Chronic kidney disease    Depression    GERD (gastroesophageal reflux disease)    History of kidney cancer    History of prostate cancer 11/30/2021   Hyperlipidemia 12/08/2022   Hypertension    Leg swelling 11/30/2021   Noticed early 2023 with ferritin deposition.    OSA (obstructive sleep apnea) 11/30/2021   Restless legs 11/30/2021   Seasonal allergies    Sleep apnea    Past Surgical History:  Procedure Laterality Date   NASAL SINUS SURGERY  2019   RENAL CRYOABLATION     TONSILLECTOMY     Family History  Problem Relation Age of Onset   Emphysema Mother    Liver cancer Mother    Pancreatic cancer Mother    Anxiety disorder Mother    Cancer Mother    Hypertension Mother    Multiple myeloma Father    Cancer Father    Hypertension Father    Colon cancer Paternal Grandmother    Colon cancer Paternal Grandfather    Social History   Socioeconomic History   Marital status: Married    Spouse name: Not on file   Number of children: 3   Years of education: 12   Highest education level: 12th grade  Occupational History   Occupation: Retired  Tobacco Use   Smoking status: Never   Smokeless tobacco: Never  Vaping Use   Vaping status: Never Used  Substance and Sexual Activity   Alcohol use: Yes    Alcohol/week: 10.0 standard drinks of  alcohol    Types: 10 Standard drinks or equivalent per week    Comment: Varies sometimes not at all   Drug use: No   Sexual activity: Not Currently  Other Topics Concern   Not on file  Social History Narrative   Right handed   Caffeine none   Lives in one story home   Social Determinants of Health   Financial Resource Strain: Low Risk  (12/28/2022)   Overall Financial Resource Strain (CARDIA)    Difficulty of Paying Living Expenses: Not hard at all  Food Insecurity: No Food Insecurity (12/28/2022)   Hunger Vital Sign    Worried About Running Out of Food in the Last Year: Never  true    Ran Out of Food in the Last Year: Never true  Transportation Needs: No Transportation Needs (12/28/2022)   PRAPARE - Administrator, Civil Service (Medical): No    Lack of Transportation (Non-Medical): No  Physical Activity: Sufficiently Active (12/28/2022)   Exercise Vital Sign    Days of Exercise per Week: 7 days    Minutes of Exercise per Session: 40 min  Stress: Stress Concern Present (12/28/2022)   [employer] of Occupational Health - Occupational Stress Questionnaire    Feeling of Stress : To some extent  Social Connections: Socially Integrated (12/28/2022)   Social Connection and Isolation Panel [NHANES]    Frequency of Communication with Friends and Family: More than three times a week    Frequency of Social Gatherings with Friends and Family: Twice a week    Attends Religious Services: More than 4 times per year    Active Member of [employer] or Organizations: Yes    Attends Engineer, structural: More than 4 times per year    Marital Status: Married    Tobacco Counseling Counseling given: Not Answered   Clinical Intake:  Pre-visit preparation completed: Yes  Pain : No/denies pain     BMI - recorded: 27.34 Nutritional Status: BMI 25 -29 Overweight Diabetes: No  How often do you need to have someone help you when you read instructions, pamphlets, or  other written materials from your doctor or pharmacy?: 1 - Never  Interpreter Needed?: No  Information entered by :: Lanier Ensign, LPN   Activities of Daily Living    12/28/2022   10:05 AM  In your present state of health, do you have any difficulty performing the following activities:  Hearing? 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 1  Comment trouble seeing well per pt after cataract surgery  Difficulty concentrating or making decisions? 0  Walking or climbing stairs? 0  Dressing or bathing? 0  Doing errands, shopping? 0  Preparing Food and eating ? N  Using the Toilet? N  In the past six months, have you accidently leaked urine? Y  Comment wears a pad  Do you have problems with loss of bowel control? N  Managing your Medications? N  Managing your Finances? N  Housekeeping or managing your Housekeeping? N    Patient Care Team: Lula Olszewski, MD as PCP - General (Internal Medicine) Storm Frisk, MD as Attending Physician (Pulmonary Disease) Antony Madura, MD as Consulting Physician (Neurology)  Indicate any recent Medical Services you may have received from other than [facility] providers in the past year (date may be approximate).     Assessment:   This is a routine wellness examination for Michaeljohn.  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  Vision Screening - Comments:: Pt follows up with Dr Dione Booze for annual eye exams    Goals Addressed             This Visit's Progress    Patient Stated       Get in better health        Depression Screen    12/28/2022   10:11 AM 12/08/2022   10:00 AM 05/31/2022    9:08 AM 12/22/2021    8:03 AM 11/30/2021    3:07 PM 06/29/2021   11:09 AM 05/14/2021    9:52 AM  PHQ 2/9 Scores  PHQ - 2 Score 1 0 2 0 0 0 0  PHQ-  9 Score 8 6 4   2  0    Fall Risk    12/28/2022    7:50 AM 12/08/2022   10:00 AM 09/03/2022    8:55 AM 05/31/2022    9:08 AM 03/03/2022    8:59 AM  Fall Risk   Falls  in the past year? 0 0 0 0 0  Number falls in past yr: 0 0 0 0 0  Injury with Fall? 0 0 0 0 0  Risk for fall due to : Impaired vision No Fall Risks  No Fall Risks   Follow up Falls prevention discussed Falls evaluation completed Falls evaluation completed Falls evaluation completed Falls evaluation completed    MEDICARE RISK AT HOME: Medicare Risk at Home Any stairs in or around the home?: No If so, are there any without handrails?: No Home free of loose throw rugs in walkways, pet beds, electrical cords, etc?: Yes Adequate lighting in your home to reduce risk of falls?: Yes Life alert?: No Use of a cane, walker or w/c?: No Grab bars in the bathroom?: Yes Shower chair or bench in shower?: No Elevated toilet seat or a handicapped toilet?: No  TIMED UP AND GO:  Was the test performed?  Yes  Length of time to ambulate 10 feet: 10 sec Gait steady and fast without use of assistive device    Cognitive Function:    12/28/2022   10:16 AM  MMSE - Mini Mental State Exam  Not completed: Unable to complete        12/22/2021    8:07 AM  6CIT Screen  What Year? 0 points  What month? 0 points  What time? 0 points  Count back from 20 0 points  Months in reverse 0 points  Repeat phrase 0 points  Total Score 0 points    Immunizations Immunization History  Administered Date(s) Administered   Fluad Quad(high Dose 65+) 11/21/2019   Influenza Whole 12/11/2010   Influenza, High Dose Seasonal PF 12/01/2022   Influenza,inj,Quad PF,6+ Mos 12/01/2021   Influenza,trivalent, recombinat, inj, PF 12/11/2010, 12/28/2011, 12/15/2012   Influenza-Unspecified 12/01/2021   Moderna Covid-19 Fall Seasonal Vaccine 29yrs & older 02/18/2022, 12/01/2022   Moderna Covid-19 Vaccine Bivalent Booster 27yrs & up 05/15/2019, 06/12/2019, 01/19/2021   Moderna SARS-COV2 Booster Vaccination 01/16/2020, 07/10/2020, 01/19/2021   Pneumococcal Polysaccharide-23 12/28/2011   Pneumococcal-Unspecified 07/04/2017    Tdap 07/05/2014, 05/12/2017   Zoster, Live 07/03/2014   Zoster, Unspecified 05/15/2017, 08/13/2017    TDAP status: Up to date  Flu Vaccine status: Up to date  Pneumococcal vaccine status: Due, Education has been provided regarding the importance of this vaccine. Advised may receive this vaccine at local pharmacy or Health Dept. Aware to provide a copy of the vaccination record if obtained from local pharmacy or Health Dept. Verbalized acceptance and understanding.  Covid-19 vaccine status: Completed vaccines  Qualifies for Shingles Vaccine? Yes   Zostavax completed No   Shingrix Completed?: No.    Education has been provided regarding the importance of this vaccine. Patient has been advised to call insurance company to determine out of pocket expense if they have not yet received this vaccine. Advised may also receive vaccine at local pharmacy or Health Dept. Verbalized acceptance and understanding.  Screening Tests Health Maintenance  Topic Date Due   Zoster Vaccines- Shingrix (1 of 2) 02/08/2023 (Originally 07/11/1969)   Pneumonia Vaccine 43+ Years old (2 of 2 - PCV) 02/09/2023 (Originally 07/05/2018)   Medicare Annual Wellness (AWV)  12/28/2023  DTaP/Tdap/Td (3 - Td or Tdap) 05/13/2027   Colonoscopy  06/06/2032   INFLUENZA VACCINE  Completed   COVID-19 Vaccine  Completed   Hepatitis C Screening  Completed   HPV VACCINES  Aged Out    Health Maintenance  There are no preventive care reminders to display for this patient.   Pt stated colonoscopy completed at [facility] and cologuard completed 06/07/22 negative results    Additional Screening:  Hepatitis C Screening: Completed 05/14/21  Vision Screening: Recommended annual ophthalmology exams for early detection of glaucoma and other disorders of the eye. Is the patient up to date with their annual eye exam?  Yes  Who is the provider or what is the name of the office in which the patient attends annual eye exams? Dr Dione Booze  If pt  is not established with a provider, would they like to be referred to a provider to establish care? No .   Dental Screening: Recommended annual dental exams for proper oral hygiene   Community Resource Referral / Chronic Care Management: CRR required this visit?  No   CCM required this visit?  No     Plan:     I have personally reviewed and noted the following in the patient's chart:   Medical and social history Use of alcohol, tobacco or illicit drugs  Current medications and supplements including opioid prescriptions. Patient is not currently taking opioid prescriptions. Functional ability and status Nutritional status Physical activity Advanced directives List of other physicians Hospitalizations, surgeries, and ER visits in previous 12 months Vitals Screenings to include cognitive, depression, and falls Referrals and appointments  In addition, I have reviewed and discussed with patient certain preventive protocols, quality metrics, and best practice recommendations. A written personalized care plan for preventive services as well as general preventive health recommendations were provided to patient.     Marzella Schlein, LPN   [id number]   After Visit Summary: (MyChart) Due to this being a telephonic visit, the after visit summary with patients personalized plan was offered to patient via MyChart   Nurse Notes: none

## 2022-12-31 ENCOUNTER — Telehealth: Payer: Self-pay | Admitting: Internal Medicine

## 2022-12-31 NOTE — Telephone Encounter (Signed)
Prescription Request  12/31/2022  LOV: 12/28/2022  What is the name of the medication or equipment? zolpidem (AMBIEN) 10 MG tablet   Have you contacted your pharmacy to request a refill? Yes   Which pharmacy would you like this sent to?  [facility] [address], [place] - [address] [place] [address] Phone: [phone] Fax: [phone]    Patient notified that their request is being sent to the clinical staff for review and that they should receive a response within 2 business days.   Please advise at Mobile [phone] (mobile)

## 2023-01-03 NOTE — Telephone Encounter (Signed)
Please send RX below

## 2023-01-05 ENCOUNTER — Other Ambulatory Visit: Payer: Self-pay

## 2023-01-05 ENCOUNTER — Other Ambulatory Visit: Payer: Self-pay | Admitting: Internal Medicine

## 2023-01-05 DIAGNOSIS — G4700 Insomnia, unspecified: Secondary | ICD-10-CM

## 2023-01-05 MED ORDER — ZOLPIDEM TARTRATE 10 MG PO TABS
10.0000 mg | ORAL_TABLET | Freq: Every evening | ORAL | 0 refills | Status: DC | PRN
Start: 2023-01-05 — End: 2023-01-18

## 2023-01-05 MED ORDER — ZOLPIDEM TARTRATE 10 MG PO TABS: 10.0000 mg | ORAL_TABLET | Freq: Every evening | ORAL | 1 refills | Status: DC | PRN

## 2023-01-05 NOTE — Telephone Encounter (Signed)
Forwarding this message to Dr. Jon Billings, I'm unable to send it in because it's a controlled substance.

## 2023-01-05 NOTE — Telephone Encounter (Signed)
Please send asap

## 2023-01-05 NOTE — Progress Notes (Signed)
Last visit sent out to urgent ophthalmology so didn't take care of Ambien refills. Sending 30 days until he can return to office for visit to address. Checked prescription drug monitoring program.

## 2023-01-12 DIAGNOSIS — M25561 Pain in right knee: Secondary | ICD-10-CM | POA: Diagnosis not present

## 2023-01-13 ENCOUNTER — Other Ambulatory Visit (HOSPITAL_COMMUNITY): Payer: Self-pay

## 2023-01-13 ENCOUNTER — Telehealth: Payer: Self-pay | Admitting: Pharmacy Technician

## 2023-01-13 NOTE — Telephone Encounter (Signed)
Clinical questions answered. PA submitted

## 2023-01-13 NOTE — Telephone Encounter (Signed)
Pharmacy Patient Advocate Encounter   Received notification from CoverMyMeds that prior authorization for Nurtec 75MG  dispersible tablets is required/requested.   Insurance verification completed.   The patient is insured through [facility] .   Per test claim: PA required; PA started via CoverMyMeds. KEY [id number] . Waiting for clinical questions to populate.

## 2023-01-14 ENCOUNTER — Other Ambulatory Visit (HOSPITAL_COMMUNITY): Payer: Self-pay

## 2023-01-14 NOTE — Telephone Encounter (Signed)
Sent my chart message informing patient of this approval.

## 2023-01-14 NOTE — Telephone Encounter (Signed)
Pharmacy Patient Advocate Encounter  Received notification from [facility] that Prior Authorization for Nurtec 75mg  tab  has been APPROVED from 01/13/23 to 01/13/24   PA #/Case ID/Reference #: [id number]  Left a message with [facility] to notify of the approval.

## 2023-01-18 ENCOUNTER — Ambulatory Visit (INDEPENDENT_AMBULATORY_CARE_PROVIDER_SITE_OTHER): Payer: Medicare Other | Admitting: Internal Medicine

## 2023-01-18 ENCOUNTER — Encounter: Payer: Self-pay | Admitting: Internal Medicine

## 2023-01-18 VITALS — BP 130/68 | HR 64 | Temp 98.2°F | Ht 69.5 in | Wt 187.8 lb

## 2023-01-18 DIAGNOSIS — G44321 Chronic post-traumatic headache, intractable: Secondary | ICD-10-CM

## 2023-01-18 DIAGNOSIS — L568 Other specified acute skin changes due to ultraviolet radiation: Secondary | ICD-10-CM | POA: Diagnosis not present

## 2023-01-18 DIAGNOSIS — H5713 Ocular pain, bilateral: Secondary | ICD-10-CM

## 2023-01-18 DIAGNOSIS — G4700 Insomnia, unspecified: Secondary | ICD-10-CM | POA: Diagnosis not present

## 2023-01-18 DIAGNOSIS — F32A Depression, unspecified: Secondary | ICD-10-CM

## 2023-01-18 DIAGNOSIS — M9903 Segmental and somatic dysfunction of lumbar region: Secondary | ICD-10-CM | POA: Diagnosis not present

## 2023-01-18 DIAGNOSIS — M51361 Other intervertebral disc degeneration, lumbar region with lower extremity pain only: Secondary | ICD-10-CM | POA: Diagnosis not present

## 2023-01-18 MED ORDER — KETOROLAC TROMETHAMINE 0.4 % OP SOLN: 1.0000 [drp] | Freq: Four times a day (QID) | OPHTHALMIC | 2 refills | Status: DC

## 2023-01-18 MED ORDER — CITALOPRAM HYDROBROMIDE 10 MG PO TABS
10.0000 mg | ORAL_TABLET | Freq: Every day | ORAL | 1 refills | Status: DC
Start: 2023-01-18 — End: 2023-02-03

## 2023-01-18 MED ORDER — ZOLPIDEM TARTRATE 10 MG PO TABS
10.0000 mg | ORAL_TABLET | Freq: Every evening | ORAL | 1 refills | Status: DC | PRN
Start: 2023-01-18 — End: 2023-08-04

## 2023-01-18 NOTE — Assessment & Plan Note (Signed)
Refilled Ambien Didn't focus on this as much as I'd like due to pressing issues with depression and headache(s).  PDMP reviewed during this encounter.

## 2023-01-18 NOTE — Patient Instructions (Signed)
VISIT SUMMARY:  During today's visit, we discussed your ongoing issues with photosensitivity and blurry vision following your cataract surgery, as well as your headaches, depression, and restless leg syndrome. We have developed a plan to address each of these concerns and will be coordinating with specialists as needed.  YOUR PLAN:  -POST-CATARACT SURGERY COMPLICATIONS: You are experiencing persistent sensitivity to light and blurry vision, especially in artificial light, since your cataract surgery. This may be due to nerve damage from the anesthesia injection. We will refer you to [facility] for a second opinion and start you on Ketorolac Acular eye drops to see if they help with any inflammation.  -MIGRAINE-LIKE SYMPTOMS: You have been experiencing headaches with sensitivity to light, which may be related to your eye issues. We will start you on migraine medication that should be available at pharmacy already now that insurance approved.  -DEPRESSION: You have been feeling depressed, likely due to your ongoing health issues and the recent loss of a family member. We will resume your Citalopram at 10mg  daily to help manage your symptoms.  -RESTLESS LEG SYNDROME: Your restless leg syndrome is currently being managed by Dr. Jacquelyne Balint, a neurologist. You should continue your current treatment under his care.  We asked him to urgently see you for headache(s) as well  INSTRUCTIONS:  Please schedule a follow-up appointment in 3-4 weeks so we can monitor your progress. Additionally, we will be referring you to [facility] for a second opinion on your eye symptoms. We will also start you on migraine medication once we receive insurance approval.

## 2023-01-18 NOTE — Progress Notes (Signed)
[facility]: [phone]   -- Medical Office Visit --  Patient:  Jay Fox      Age: 72 y.o.       Sex:  male  Date:   01/18/2023 Today's Healthcare Provider: Lula Olszewski, MD  ====================================================================================     Assessment & Plan Depressive disorder His headaches feature photophobia, which may stem from eye issues. We will initiate a trial of prescribed migraine medication once we confirm insurance approval.  Insomnia, unspecified type Refilled Ambien Didn't focus on this as much as I'd like due to pressing issues with depression and headache(s).  PDMP reviewed during this encounter.  Intractable chronic post-traumatic headache He exhibits persistent photophobia and blurry vision, especially in artificial light, since his cataract surgery in September, with no improvement after various interventions, suggesting possible nerve damage from the anesthesia injection. We will urgently refer him to [facility] for a second opinion and start a trial of Ketorolac Acular eye drops for potential inflammation.  His headaches feature photophobia, which may stem from eye issues. We will initiate a trial of prescribed migraine medication once we confirm insurance approval.  Urgent referred to his existing neurologist Dr. Loleta Chance as he is desperate for relief. Photosensitivity He exhibits persistent photophobia and blurry vision, especially in artificial light, since his cataract surgery in September, with no improvement after various interventions, suggesting possible nerve damage from the anesthesia injection. We will urgently refer him to [facility] for a second opinion and start a trial of Ketorolac Acular eye drops for potential inflammation. Pain of both eyes Trial acular and refer to 2nd opinion opth and follow up soon    Encounter orders           Ordered    Ambulatory referral to Neurology       Comments: Photosensitivity headaches.  Existing patient of Dr. Loleta Chance for restless legs syndrome.   01/18/23 1141    Ambulatory referral to Ophthalmology       Comments: Patient request 2nd opinion.  Disabling nonresolving light sensitivity after cataract surgery September 2024.  Surgeon not able to find cause.  Would like re-evaluation.  Steroid drops briefly helped light sensitivity. Vision coming and going.   01/18/23 1141    zolpidem (AMBIEN) 10 MG tablet  At bedtime PRN        01/18/23 1141    citalopram (CELEXA) 10 MG tablet  Daily        01/18/23 1141    ketorolac (ACULAR) 0.4 % SOLN  4 times daily        01/18/23 1141          Diagnoses and all orders for this visit: Depressive disorder -     citalopram (CELEXA) 10 MG tablet; Take 1 tablet (10 mg total) by mouth daily. Insomnia, unspecified type -     zolpidem (AMBIEN) 10 MG tablet; Take 1 tablet (10 mg total) by mouth at bedtime as needed for sleep. Can NOT take with opioid pain medicine, alcohol, or other sedative.  Risk dementia/mornings impaired alertness Intractable chronic post-traumatic headache -     Ambulatory referral to Neurology -     Ambulatory referral to Ophthalmology Photosensitivity -     Ambulatory referral to Neurology -     Ambulatory referral to Ophthalmology -     zolpidem (AMBIEN) 10 MG tablet; Take 1 tablet (10 mg total) by mouth at bedtime as needed for sleep. Can NOT take with  opioid pain medicine, alcohol, or other sedative.  Risk dementia/mornings impaired alertness -     ketorolac (ACULAR) 0.4 % SOLN; Place 1 drop into both eyes 4 (four) times daily. Pain of both eyes -     ketorolac (ACULAR) 0.4 % SOLN; Place 1 drop into both eyes 4 (four) times daily.  Recommended follow-up: Given his multiple ongoing health issues, we suggest a follow-up appointment in 3-4 weeks for close monitoring.  Future Appointments  Date Time Provider Department Center   01/27/2023 10:40 AM Lennette Bihari, MD [facility] None  03/04/2023 10:30 AM Antony Madura, MD [facility] None  01/03/2024 10:00 AM [facility] ANNUAL WELLNESS VISIT 1 [facility]  Patient Care Team: Lula Olszewski, MD as PCP - General (Internal Medicine) Storm Frisk, MD as Attending Physician (Pulmonary Disease) Antony Madura, MD as Consulting Physician (Neurology)    SUBJECTIVE: 72 y.o. male who has Asthma; Hypertension; Seasonal allergies; Urinary incontinence; Skin lesions, generalized; Prostate cancer (HCC); Insomnia; Heart palpitations; Benign essential hypertension; Asthma, moderate persistent; History of renal cell carcinoma; Personal history of prostate cancer; History of radical prostatectomy; Leg swelling; Restless leg syndrome; Prediabetes; OSA (obstructive sleep apnea); Back wound; Generalized abdominal pain; High risk medication use; Abdominal distension; Altered olfactory perception; Jugular venous distension; Cervical lymphadenopathy; and Hyperlipidemia on their problem list.. Main reasons for visit/main concerns/chief complaint: 3 week follow-up (Reorder Ambien.) and Discuss medication (Wants to discuss starting Citalopram again.)   ------------------------------------------------------------------------------------------------------------------------ AI-Extracted: Discussed the use of AI scribe software for clinical note transcription with the patient, who gave verbal consent to proceed.  History of Present Illness   The patient, with a history of cataract surgery, presents with persistent photosensitivity and blurry vision, particularly in the left eye. The symptoms are triggered by artificial light, especially from screens and fluorescent lights, but not by sunlight. The patient reports that the discomfort begins after approximately 30 minutes of exposure to these light sources. The symptoms have been ongoing since the cataract surgery in mid-September and have not  improved or worsened significantly over time.  The patient also reports a pounding sensation in the head, akin to tinnitus, which is exacerbated by exposure to fluorescent lights. The patient denies any significant tearing of the eyes. The patient has tried various types of glasses, including blue light glasses, but none have provided significant relief.  The patient also reports a history of restless legs, managed by Lyrica, and a history of prostate cancer, treated with hormone shots. The patient has been experiencing depression, which has worsened since the death of a family member in April 21, 2022. The patient has been self-administering citalopram, an antidepressant previously prescribed for night sweats associated with hormone treatment, which has provided some relief for the depression.  The patient has sought multiple opinions for the eye symptoms, but no definitive cause or effective treatment has been identified. The patient reports a family history of a similar reaction to cataract surgery. The patient is currently awaiting approval for migraine medication and is considering seeking further specialist opinions.       Note that patient  has a past medical history of Allergy (1953), Altered olfactory perception (05/31/2022), Anxiety, Arthritis, Asthma, BPH (benign prostatic hypertrophy), BPH (benign prostatic hypertrophy), Cancer (HCC), Cataract, Chronic kidney disease, Depression, GERD (gastroesophageal reflux disease), History of kidney cancer, History of prostate cancer (11/30/2021), Hyperlipidemia (12/08/2022), Hypertension, Leg swelling (11/30/2021), OSA (obstructive sleep apnea) (11/30/2021), Restless legs (11/30/2021), Seasonal allergies, and Sleep apnea.  Problem list overviews that were updated at today's visit:No  problems updated.  Med reconciliation: Current Outpatient Medications on File Prior to Visit  Medication Sig   albuterol (PROVENTIL) (2.5 MG/3ML) 0.083% nebulizer solution Take 3  mLs (2.5 mg total) by nebulization every 6 (six) hours as needed for wheezing or shortness of breath.   albuterol (VENTOLIN HFA) 108 (90 Base) MCG/ACT inhaler Inhale 2 puffs into the lungs every 6 (six) hours as needed for wheezing or shortness of breath.   amLODipine (NORVASC) 10 MG tablet Take 1 tablet (10 mg total) by mouth daily.   Ascorbic Acid (VITAMIN C PO) Take 1-2 tablets by mouth 2 (two) times daily. 500mg    budesonide-formoterol (BREYNA) 160-4.5 MCG/ACT inhaler Inhale 2 puffs into the lungs 2 (two) times daily.   calcium carbonate (SUPER CALCIUM) 1500 (600 Ca) MG TABS tablet 1 tablet with meals Orally Twice a day for 30 day(s)   calcium carbonate (TUMS - DOSED IN MG ELEMENTAL CALCIUM) 500 MG chewable tablet Chew 1 tablet by mouth daily.   Calcium Carbonate-Vit D-Min (CALCIUM 1200 PO) Take 1 tablet by mouth daily.   Cholecalciferol (VITAMIN D PO) Take 1 tablet by mouth daily.   Cholecalciferol (VITAMIN D3) 50 MCG (2000 UT) TABS Take 1 tablet by mouth daily.   diclofenac Sodium (VOLTAREN) 1 % GEL Place onto the skin.   fluticasone (FLONASE) 50 MCG/ACT nasal spray Place into the nose.   irbesartan (AVAPRO) 300 MG tablet Take 1 tablet by mouth once daily   Multiple Vitamin (MULTIVITAMIN) tablet Take 1 tablet by mouth daily.   Omega-3 Fatty Acids (FISH OIL) 1200 MG CAPS Take 1,200 mg by mouth in the morning and at bedtime.   pregabalin (LYRICA) 150 MG capsule Take 1 capsule (150 mg total) by mouth at bedtime.   Respiratory Therapy Supplies MISC Filters Hoses tubing masks headgear cleaning supplies for dreamwear and  philips respironics bipap machine.   Rimegepant Sulfate (NURTEC) 75 MG TBDP Take 1 tablet (75 mg total) by mouth daily as needed.   sildenafil (REVATIO) 20 MG tablet 1 tablet Orally Once a day   Spacer/Aero-Holding Chambers (AEROCHAMBER MV) inhaler Use as directed with albuterol inhaler.   TURMERIC PO Take 1,000 mg by mouth in the morning and at bedtime.   vitamin B-12  (CYANOCOBALAMIN) 500 MCG tablet Take 1 tablet by mouth daily.   No current facility-administered medications on file prior to visit.   Medications Discontinued During This Encounter  Medication Reason   zolpidem (AMBIEN) 10 MG tablet Reorder     Objective   Physical Exam     01/18/2023   10:44 AM 12/28/2022   11:25 AM 12/28/2022   10:00 AM  Vitals with BMI  Height 5' 9.5" 5' 9.5"   Weight 187 lbs 13 oz 186 lbs 13 oz 187 lbs 13 oz  BMI 27.35 27.2 27.35  Systolic 130 130 409  Diastolic 68 72 68  Pulse 64 85 75   Wt Readings from Last 10 Encounters:  01/18/23 187 lb 12.8 oz (85.2 kg)  12/28/22 186 lb 12.8 oz (84.7 kg)  12/28/22 187 lb 12.8 oz (85.2 kg)  12/08/22 188 lb 2 oz (85.3 kg)  09/22/22 187 lb 3.2 oz (84.9 kg)  09/03/22 186 lb (84.4 kg)  05/31/22 185 lb 12.8 oz (84.3 kg)  03/29/22 182 lb (82.6 kg)  03/03/22 179 lb 3.2 oz (81.3 kg)  01/04/22 177 lb (80.3 kg)   Vital signs reviewed.  Nursing notes reviewed. Weight trend reviewed. Abnormalities and Problem-Specific physical exam findings:  sits in dark  room because can't tolerate fluorescent lights  General Appearance:  No acute distress appreciable.   Well-groomed, healthy-appearing male.  Well proportioned with no abnormal fat distribution.  Good muscle tone. Pulmonary:  Normal work of breathing at rest, no respiratory distress apparent. SpO2: 95 %  Musculoskeletal: All extremities are intact.  Neurological:  Awake, alert, oriented, and engaged.  No obvious focal neurological deficits or cognitive impairments.  Sensorium seems unclouded.   Speech is clear and coherent with logical content. Psychiatric:  Appropriate mood, pleasant and cooperative demeanor, thoughtful and engaged during the exam  Results            No results found for any visits on 01/18/23.  Clinical Support on 12/28/2022  Component Date Value   Cologuard 06/07/2022 Negative   Office Visit on 12/08/2022  Component Date Value   WBC 12/08/2022  6.0    RBC 12/08/2022 4.89    Platelets 12/08/2022 240.0    Hemoglobin 12/08/2022 14.2    HCT 12/08/2022 42.9    MCV 12/08/2022 87.8    MCHC 12/08/2022 33.1    RDW 12/08/2022 14.1    Sodium 12/08/2022 141    Potassium 12/08/2022 4.1    Chloride 12/08/2022 105    CO2 12/08/2022 27    Glucose, Bld 12/08/2022 109 (H)    BUN 12/08/2022 17    Creatinine, Ser 12/08/2022 1.02    Total Bilirubin 12/08/2022 0.7    Alkaline Phosphatase 12/08/2022 67    AST 12/08/2022 18    ALT 12/08/2022 21    Total Protein 12/08/2022 7.1    Albumin 12/08/2022 4.5    GFR 12/08/2022 73.99    Calcium 12/08/2022 9.9    Cholesterol 12/08/2022 134    Triglycerides 12/08/2022 227.0 (H)    HDL 12/08/2022 50.90    VLDL 12/08/2022 45.4 (H)    LDL Cholesterol 12/08/2022 38    Total CHOL/HDL Ratio 12/08/2022 3    NonHDL 12/08/2022 83.39    TSH 12/08/2022 2.28    Hgb A1c MFr Bld 12/08/2022 5.9   Office Visit on 05/31/2022  Component Date Value   COLOGUARD 06/07/2022 Negative   Orders Only on 04/07/2022  Component Date Value   Total Protein, Urine 04/07/2022 7.9    Total Protein, Urine-Ur/* 04/07/2022 122    Albumin, U 04/07/2022 50.3    ALPHA 1 URINE 04/07/2022 9.4    Alpha 2, Urine 04/07/2022 11.9    % BETA, Urine 04/07/2022 23.1    GAMMA GLOBULIN URINE 04/07/2022 5.4    Free Kappa Lt Chains,Ur 04/07/2022 12.36    Free Lambda Lt Chains,Ur 04/07/2022 1.76    Free Kappa/Lambda Ratio 04/07/2022 7.02    Immunofixation Result, U* 04/07/2022 Comment    Total Volume 04/07/2022 1,550    M-SPIKE %, Urine 04/07/2022 Not Observed    Note: 04/07/2022 Comment   Appointment on 04/05/2022  Component Date Value   LDH 04/05/2022 131    Beta-2 Microglobulin 04/05/2022 1.5    Kappa free light chain 04/05/2022 18.5    Lambda free light chains 04/05/2022 14.1    Kappa, lambda light chai* 04/05/2022 1.31    IgG (Immunoglobin G), Se* 04/05/2022 787    IgA 04/05/2022 149    IgM (Immunoglobulin M), * 04/05/2022 88     Total Protein ELP 04/05/2022 6.6    Albumin SerPl Elph-Mcnc 04/05/2022 3.8    Alpha 1 04/05/2022 0.2    Alpha2 Glob SerPl Elph-M* 04/05/2022 0.7    B-Globulin SerPl Elph-Mc* 04/05/2022 1.0    Gamma  Glob SerPl Elph-Mc* 04/05/2022 0.9    M Protein SerPl Elph-Mcnc 04/05/2022 Not Observed    Globulin, Total 04/05/2022 2.8    Albumin/Glob SerPl 04/05/2022 1.4    IFE 1 04/05/2022 Comment    Please Note 04/05/2022 Comment    Sodium 04/05/2022 140    Potassium 04/05/2022 4.2    Chloride 04/05/2022 106    CO2 04/05/2022 28    Glucose, Bld 04/05/2022 110 (H)    BUN 04/05/2022 19    Creatinine 04/05/2022 0.97    Calcium 04/05/2022 10.2    Total Protein 04/05/2022 7.0    Albumin 04/05/2022 4.4    AST 04/05/2022 15    ALT 04/05/2022 20    Alkaline Phosphatase 04/05/2022 69    Total Bilirubin 04/05/2022 0.5    GFR, Estimated 04/05/2022 >60    Anion gap 04/05/2022 6    WBC Count 04/05/2022 5.8    RBC 04/05/2022 4.96    Hemoglobin 04/05/2022 14.7    HCT 04/05/2022 43.7    MCV 04/05/2022 88.1    MCH 04/05/2022 29.6    MCHC 04/05/2022 33.6    RDW 04/05/2022 13.1    Platelet Count 04/05/2022 220    nRBC 04/05/2022 0.0    Neutrophils Relative % 04/05/2022 62    Neutro Abs 04/05/2022 3.6    Lymphocytes Relative 04/05/2022 20    Lymphs Abs 04/05/2022 1.1    Monocytes Relative 04/05/2022 11    Monocytes Absolute 04/05/2022 0.6    Eosinophils Relative 04/05/2022 6    Eosinophils Absolute 04/05/2022 0.4    Basophils Relative 04/05/2022 1    Basophils Absolute 04/05/2022 0.1    Immature Granulocytes 04/05/2022 0    Abs Immature Granulocytes 04/05/2022 0.02   Office Visit on 03/29/2022  Component Date Value   Amylase 03/29/2022 50    Lipase 03/29/2022 20.0    PSA 03/29/2022 0.00 Repeated and verified X2. (L)    Sodium 03/29/2022 143    Potassium 03/29/2022 4.7    Chloride 03/29/2022 105    CO2 03/29/2022 30    Glucose, Bld 03/29/2022 111 (H)    BUN 03/29/2022 20    Creatinine, Ser  03/29/2022 0.98    Total Bilirubin 03/29/2022 0.5    Alkaline Phosphatase 03/29/2022 76    AST 03/29/2022 15    ALT 03/29/2022 19    Total Protein 03/29/2022 6.8    Albumin 03/29/2022 4.5    GFR 03/29/2022 78.01    Calcium 03/29/2022 10.1    WBC 03/29/2022 6.6    RBC 03/29/2022 4.89    Platelets 03/29/2022 226.0    Hemoglobin 03/29/2022 14.5    HCT 03/29/2022 43.3    MCV 03/29/2022 88.6    MCHC 03/29/2022 33.4    RDW 03/29/2022 13.6   Lab on 03/03/2022  Component Date Value   Immunofix Electr Int 03/03/2022     Immunoglobulin A 03/03/2022 151    IgG (Immunoglobin G), Se* 03/03/2022 836    IgM, Serum 03/03/2022 92    No image results found.   No results found.  CT Abdomen Pelvis W Contrast  Result Date: 03/30/2022 CLINICAL DATA:  Abdominal pain for 1 month which radiates to the left. Irregular bowel movements. History of prostate cancer and renal cell carcinoma. EXAM: CT ABDOMEN AND PELVIS WITH CONTRAST TECHNIQUE: Multidetector CT imaging of the abdomen and pelvis was performed using the standard protocol following bolus administration of intravenous contrast. RADIATION DOSE REDUCTION: This exam was performed according to the departmental dose-optimization program which includes automated  exposure control, adjustment of the mA and/or kV according to patient size and/or use of iterative reconstruction technique. CONTRAST:  85mL OMNIPAQUE IOHEXOL 300 MG/ML  SOLN COMPARISON:  11/13/2021 FINDINGS: Lower chest: No pleural fluid or airspace disease. Scarring within the right middle lobe and lingula is again seen. Hepatobiliary: Small hypodense lesion within segment 8/4 is unchanged measuring 5 mm. No suspicious liver lesions identified on today's study. Gallbladder appears normal. No bile duct dilatation. Pancreas: Unremarkable. No pancreatic ductal dilatation or surrounding inflammatory changes. Spleen: Normal in size without focal abnormality. Adrenals/Urinary Tract: Normal adrenal glands.  Stable postoperative change from partial nephrectomy off the upper pole of the left kidney. Bilateral kidney cysts are again seen in appears stable in the interval. The largest arises off the medial cortex of the left kidney measuring 3.3 cm. No nephrolithiasis or hydronephrosis. Urinary bladder appears normal. Stomach/Bowel: Stomach is within normal limits. The appendix is visualized and appears normal. Sigmoid diverticulosis without signs of acute diverticulitis. No bowel wall thickening or inflammation. No pathologic dilatation of the large or small bowel loops. Vascular/Lymphatic: Normal appearance of the abdominal aorta. No enlarged abdominopelvic adenopathy. Reproductive: Status post prostatectomy. Other: No free fluid or fluid collections. Small fat containing umbilical hernia. No signs of pneumoperitoneum. Musculoskeletal: Mild multilevel lumbar spondylosis. No acute or suspicious osseous findings. IMPRESSION: 1. No acute findings within the abdomen or pelvis. 2. Sigmoid diverticulosis without signs of acute diverticulitis. 3. Status post partial nephrectomy off the upper pole of the left kidney. No signs of recurrent or metastatic disease. 4. Status post prostatectomy. Electronically Signed   By: Signa Kell M.D.   On: 03/30/2022 09:57         Additional Info: This encounter employed real-time, collaborative documentation. The patient actively reviewed and updated their medical record on a shared screen, ensuring transparency and facilitating joint problem-solving for the problem list, overview, and plan. This approach promotes accurate, informed care. The treatment plan was discussed and reviewed in detail, including medication safety, potential side effects, and all patient questions. We confirmed understanding and comfort with the plan. Follow-up instructions were established, including contacting the office for any concerns, returning if symptoms worsen, persist, or new symptoms develop, and  precautions for potential emergency department visits.

## 2023-01-19 ENCOUNTER — Ambulatory Visit: Payer: Medicare Other | Admitting: Internal Medicine

## 2023-01-20 ENCOUNTER — Telehealth: Payer: Self-pay | Admitting: Neurology

## 2023-01-20 NOTE — Telephone Encounter (Signed)
Caller stated he's been having some

## 2023-01-26 NOTE — Progress Notes (Signed)
NEUROLOGY FOLLOW UP OFFICE NOTE  Jay Fox [id number]  Subjective:  Jay Fox is a 72 y.o. year old right-handed male with a medical history of HTN, prostate cancer, renal cell carcinoma, CKD, restless leg syndrome, chronic low back pain, OSA (on BiPAP), prediabetes, depression, anxiety who we last saw on 09/03/22 for restless leg syndrome.  To briefly review: Initial consult 12/02/21: Patient has had RLS for 10-12 years. He describes it as a creepy crawly feeling in both feet (on bottom). He feels the urge to move his feet. If he gets up and walks, the sensation goes away. Poor sleep makes his symptoms worse. He takes ropinirole 5 mg at 9:30 pm and if his symptoms return or does not work, he takes another 5 mg (usually will be 11pm to 12am). Most nights he now takes both 5 mg doses (10 mg total). He thinks he has been on the ropinirole for about 2 years. He was on gabapentin and an under the tongue medication then. Gabapentin did not help. He does not remember the dose. He was started on 2.5 mg at that time.   Patient was seen by his PCP, Dr. Jon Billings on 11/30/21 for his symptoms. Per the clinic note, patient has a 10-12 year history of RLS. His symptoms have been progressively worsening despite increasing doses of ropinirole. He was taking 10 mg daily at that clinic visit. It was recommended the patient wean off ropinirole at some point. Labs and MRI lumbar spine was ordered. He was prescribed on pramipexole 0.5 mg TID and rotigotine patch (3 mg) daily. He has not gotten either yet. One was covered by insurance and one was not (he does not remember which today). He was also told to stop citalopram. Patient also mentioned a vein issue that has popped up over the last 6 months. He will be seeing a vascular specialist for this. Patient was referred to neurology for further work up and management of RLS.   Do RLS symptoms appear earlier than when ropinirole was first started? Sometimes yes, but  not all the time. Over the last 6 months or so, he now gets the symptoms when taking a nap or sitting for long periods. Are higher doses now needed or do you need to take the medication earlier to control symptoms? Yes Has intensity of symptoms worsened since starting ropinirole? No change in intensity, but happening more. Have symptoms spread to other body parts since starting ropinirole? No   He denies numbness and tingling when not having RLS symptoms (during the day). He does have a history of plantar fasciitis (bilateral) and Morton's neuroma on right foot (early 2000s).   He describes poor sleep due to sleep apnea but also insomnia. He previously saw sleep medicine in [place], but has no one here. He is interested in establishing care.   Patient also mentions achy teeth on the left side of mouth since 2016. He had a burning mouth for a while. A neurologist in [place] put his on gabapentin, which did not help. This resolved, but he recently had caps put on and the tooth pain has returned. The burning mouth has not returned.   03/03/22: Patient messaged that his RLS was worsening on 01/11/22. I increased his Lyrica to 100 mg and asked that he reduce ropinirole to 5 mg (was taking 10 mg). I further reduced this to 2.5 mg (1/2 tablet on 01/26/22). Patient reported improvement on 02/05/22.   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   Since patient increased Lyrica to 100 mg, his symptoms have resolved. He continues to take magnesium. He no longer takes ropinirole.   09/03/22: Patient's IFE again showed a faint IgG lambda monoclonal antibody. I referred him to hematology. There was no indication of abnormal M protein on further testing and no indication of plasma cell dyscrasia.   Patient has noticed increase in symptoms in legs needing to move over the last 2 months. The  last couple have weeks have been the worse. Patient is prescribed Lyrica 100 mg at bedtime. He takes 100 mg around 9:30 pm. He may take 1/2 capsule or full capsule later in the night. Taking more Lyrica will help with the symptoms.   He continues on BiPAP for OSA.   He has no other complaints.  Most recent Assessment and Plan (09/03/22): This is Jay Fox, a 72 y.o. male with restless leg syndrome with normal ferritin (12/01/21). 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 He has noticed some increase in symptoms, but a self increase in Lyrica helped symptoms.   Plan: -Increase Lyrica to 150 mg at bedtime -Again recommend retrying melatonin to help with sleep  Since their last visit: Patient had cataract surgery (left eye on 10/29/22, right eye 11/26/22). After the left eye surgery, he had a lot of blurriness of vision, particularly on the left peripheral vision. He then got the right side done. After the right sided surgery, he noticed light sensitivity. He was on steroid drops and when these were stopped, the headaches began. He describes the symptoms as starting anxiety attacks (anxiety in chest), then tinnitus in ears and pounding on sides of head, then eye and forehead head pain (pounding, pressure, throbbing). It is worse with exposure to artifical lights (computer, TV, lights in buildings) but not sunlight as much. He rates the pain as 7-8/10. It usually happens every day, but does not occur unless he is exposed to artificial light. The headache will last 1-2 hours. Closing his eyes, being in a dark, cold room, and laying down with a cold compress with help. He tried tylenol and ibuprofen but neither seemed to help. Patient was prescribed Nurtec 75 mg PRN for rescue. He has taken it twice. When he took it at headache onset, it worked quickly.  Prior to surgery, patient only had occasional sinus headaches.  He denies jaw claudication or fevers.  He was seen by Earley Brooke who did the surgery (last on 01/10/23). There were no significant ophthalmic abnormalities seen to explain symptoms per patient. Patient requested second opinion. He has not heard from anyone about this.  He has been wearing yellow tinted glasses to help block some of the light.  He mentions that while living in [place], he would occasionally have tunnel vision and weird head sensation episodes, usually at higher altitudes. This has not happened recently.  In terms of RLS that I saw patient for previously, patient is doing well. He is happy with Lyrica since it was increased.  MEDICATIONS:  Outpatient Encounter Medications as of 02/03/2023  Medication Sig   albuterol (PROVENTIL) (2.5 MG/3ML) 0.083% nebulizer solution Take 3 mLs (2.5 mg total) by nebulization every 6 (six) hours as needed for wheezing or shortness of breath.   albuterol (VENTOLIN HFA) 108 (90 Base) MCG/ACT inhaler Inhale 2 puffs into the lungs every 6 (six) hours  as needed for wheezing or shortness of breath.   amLODipine (NORVASC) 10 MG tablet Take 1 tablet (10 mg total) by mouth daily.   Ascorbic Acid (VITAMIN C PO) Take 1 tablet by mouth once. 500mg    budesonide-formoterol (BREYNA) 160-4.5 MCG/ACT inhaler Inhale 2 puffs into the lungs 2 (two) times daily.   calcium carbonate (SUPER CALCIUM) 1500 (600 Ca) MG TABS tablet 1 tablet with meals Orally Twice a day for 30 day(s)   calcium carbonate (TUMS - DOSED IN MG ELEMENTAL CALCIUM) 500 MG chewable tablet Chew 1 tablet by mouth as needed.   Cholecalciferol (VITAMIN D3) 50 MCG (2000 UT) TABS Take 1 tablet by mouth daily.   citalopram (CELEXA) 10 MG tablet Take 1 tablet (10 mg total) by mouth daily.   diclofenac Sodium (VOLTAREN) 1 % GEL Place onto the skin.   fluticasone (FLONASE) 50 MCG/ACT nasal spray Place into the nose.   irbesartan (AVAPRO) 300 MG tablet Take 1 tablet by mouth once daily   ketorolac (ACULAR) 0.4 % SOLN Place 1 drop into both eyes 4 (four) times  daily.   Multiple Vitamin (MULTIVITAMIN) tablet Take 1 tablet by mouth daily.   Omega-3 Fatty Acids (FISH OIL) 1200 MG CAPS Take 1,200 mg by mouth in the morning and at bedtime.   pregabalin (LYRICA) 150 MG capsule Take 1 capsule (150 mg total) by mouth at bedtime.   Respiratory Therapy Supplies MISC Filters Hoses tubing masks headgear cleaning supplies for dreamwear and  philips respironics bipap machine.   Rimegepant Sulfate (NURTEC) 75 MG TBDP Take 1 tablet (75 mg total) by mouth daily as needed.   sildenafil (REVATIO) 20 MG tablet 1 tablet Orally Once a day   Spacer/Aero-Holding Chambers (AEROCHAMBER MV) inhaler Use as directed with albuterol inhaler.   TURMERIC PO Take 1,000 mg by mouth in the morning and at bedtime.   vitamin B-12 (CYANOCOBALAMIN) 500 MCG tablet Take 1 tablet by mouth daily.   zolpidem (AMBIEN) 10 MG tablet Take 1 tablet (10 mg total) by mouth at bedtime as needed for sleep. Can NOT take with opioid pain medicine, alcohol, or other sedative.  Risk dementia/mornings impaired alertness   Calcium Carbonate-Vit D-Min (CALCIUM 1200 PO) Take 1 tablet by mouth daily. (Patient not taking: Reported on 02/03/2023)   Cholecalciferol (VITAMIN D PO) Take 1 tablet by mouth daily. (Patient not taking: Reported on 02/03/2023)   No facility-administered encounter medications on file as of 02/03/2023.    PAST MEDICAL HISTORY: Past Medical History:  Diagnosis Date   Allergy 1953   Altered olfactory perception 05/31/2022   Smells cigarettes sometimes     Anxiety    Arthritis    Asthma    BPH (benign prostatic hypertrophy)    BPH (benign prostatic hypertrophy)    Cancer (HCC)    Cataract    Chronic kidney disease    Depression    GERD (gastroesophageal reflux disease)    History of kidney cancer    History of prostate cancer 11/30/2021   Hyperlipidemia 12/08/2022   Hypertension    Leg swelling 11/30/2021   Noticed early 2023 with ferritin deposition.    OSA (obstructive sleep  apnea) 11/30/2021   Restless legs 11/30/2021   Seasonal allergies    Sleep apnea     PAST SURGICAL HISTORY: Past Surgical History:  Procedure Laterality Date   CATARACT EXTRACTION, BILATERAL Bilateral 11/2022   10/24 left   NASAL SINUS SURGERY  2019   RENAL CRYOABLATION     TONSILLECTOMY  ALLERGIES: Allergies  Allergen Reactions   Shellfish Allergy Rash, Shortness Of Breath and Swelling   Shellfish-Derived Products Shortness Of Breath    FAMILY HISTORY: Family History  Problem Relation Age of Onset   Emphysema Mother    Liver cancer Mother    Pancreatic cancer Mother    Anxiety disorder Mother    Cancer Mother    Hypertension Mother    Multiple myeloma Father    Cancer Father    Hypertension Father    Cancer Paternal Grandmother    Colon cancer Paternal Grandmother    Cancer Paternal Grandfather    Colon cancer Paternal Grandfather     SOCIAL HISTORY: Social History   Tobacco Use   Smoking status: Never   Smokeless tobacco: Never  Vaping Use   Vaping status: Never Used  Substance Use Topics   Alcohol use: Yes    Alcohol/week: 10.0 standard drinks of alcohol    Types: 10 Standard drinks or equivalent per week    Comment: Varies sometimes not at all   Drug use: No   Social History   Social History Narrative   Right handed   Caffeine none   Lives in one story home   Retired    Lives with wife      Objective:  Vital Signs:  BP 135/68   Pulse 62   Ht 5' 9.5" (1.765 m)   Wt 188 lb (85.3 kg)   BMI 27.36 kg/m   General: No acute distress.  Patient appears well-groomed.   Head:  Normocephalic/atraumatic Eyes:  fundi examined but not visualized Neck: supple, normal range of motion Lungs: Non-labored breathing on room air   Neurological Exam: Mental status: alert and oriented, speech fluent and not dysarthric, language intact.  Cranial nerves: CN I: not tested CN II: pupils small, equal, round and reactive to light, visual fields  intact CN III, IV, VI:  full range of motion, no nystagmus, no ptosis CN V: facial sensation intact. CN VII: upper and lower face symmetric CN VIII: hearing intact CN IX, X: uvula midline CN XI: sternocleidomastoid and trapezius muscles intact CN XII: tongue midline  Bulk & Tone: normal, no fasciculations. Motor:  muscle strength 5/5 throughout Deep Tendon Reflexes:  2+ throughout.   Sensation:  Light touch sensation intact. Finger to nose testing:  Without dysmetria.   Gait:  Normal station and stride.  Labs and Imaging review: New results: 12/08/22: HbA1c: 5.9 TSH wnl Lipid panel: tChol 134, LDL 38, TG 227 CMP unremarkable CBC unremarkable  Previously reviewed results: IFE (03/03/22): faint IgG lambda monoclonal immunoglobulin detected   MM panel (04/05/22): No M protein seen K/L (04/05/22): wnl CBC and CMP (04/05/22): unremarkable   12/02/21: B12: 470 IFE: IgG lambda monoclonal ab present HbA1c: 6.0    12/01/21: Normal or unremarkable: CBC, CMP, Ferritin 134.1   EMG (01/25/22): NCV & EMG Findings: Extensive electrodiagnostic evaluation of the left lower limb shows: Left sural and superficial peroneal sensory studies are within normal limits. Left peroneal (EDB) and tibial (AH) motor studies are within normal limits. Left H reflex response shows a mildly prolonged latency. Chronic motor axon loss changes without accompanying active denervation changes are seen in the left tibialis anterior, flexor digitorum longus, medial head of gastrocnemius, and short head of bicep femoris muscles.   Impression: This is an abnormal electrodiagnostic evaluation. The findings are most consistent with the following: The residuals of an old intraspinal canal lesion (ie: motor radiculopathy) at the left L5 and S1  roots, mild in degree electrically. No electrodiagnostic evidence of a generalized polyneuropathy or myopathy.  Assessment/Plan:  This is Jay Fox, a 72 y.o. male  with: Restless leg syndrome - symptoms currently well controlled on Lyrica 150 mg. He had a normal ferritin (12/01/21). 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  Photosensitive headache - since cataract surgery, only to artificial light and not sunlight. There is likely an anxiety component as this is how headaches start. Patient is concerned about nerve damage to the eye that may have occurred during surgery. I explained that any small nerves like this I would not be able to prove or disprove. His CN testing appears normal to me today, so there is no obvious nerve injury. Perhaps the cataract removal has caused photosensitivity and new onset headaches as a result. Hopefully with treating headaches, we can greatly reduce symptoms. GCA is another potential mimic, so I will get inflammatory markers to screen for this today.  Plan: -Blood work: ESR, CRP -For headaches: Headache prevention:  Venlafaxine 37.5 mg for 1 week, then increase to 75 mg daily thereafter. Will stop citalopram. Headache rescue:  Nurtec 75 mg as needed at headache onset Limit use of pain relievers to no more than 2 days out of week to prevent risk of rebound or medication-overuse headache. Keep headache diary  Continue Lyrica 150 mg bedtime for RLS  Return to clinic in 3 months  Total time spent reviewing records, interview, history/exam, documentation, and coordination of care on day of encounter:  60 min  Jacquelyne Balint, MD

## 2023-01-27 ENCOUNTER — Ambulatory Visit: Payer: Medicare Other | Attending: Cardiovascular Disease | Admitting: Cardiovascular Disease

## 2023-01-27 ENCOUNTER — Encounter: Payer: Self-pay | Admitting: Cardiovascular Disease

## 2023-01-27 VITALS — BP 128/88 | HR 57 | Ht 69.5 in | Wt 193.2 lb

## 2023-01-27 DIAGNOSIS — I872 Venous insufficiency (chronic) (peripheral): Secondary | ICD-10-CM

## 2023-01-27 DIAGNOSIS — I10 Essential (primary) hypertension: Secondary | ICD-10-CM | POA: Diagnosis not present

## 2023-01-27 DIAGNOSIS — J4520 Mild intermittent asthma, uncomplicated: Secondary | ICD-10-CM

## 2023-01-27 DIAGNOSIS — E782 Mixed hyperlipidemia: Secondary | ICD-10-CM | POA: Diagnosis not present

## 2023-01-27 DIAGNOSIS — E785 Hyperlipidemia, unspecified: Secondary | ICD-10-CM

## 2023-01-27 DIAGNOSIS — G4733 Obstructive sleep apnea (adult) (pediatric): Secondary | ICD-10-CM

## 2023-01-27 DIAGNOSIS — R002 Palpitations: Secondary | ICD-10-CM

## 2023-01-27 DIAGNOSIS — R011 Cardiac murmur, unspecified: Secondary | ICD-10-CM | POA: Diagnosis not present

## 2023-01-27 DIAGNOSIS — G2581 Restless legs syndrome: Secondary | ICD-10-CM

## 2023-01-27 NOTE — Patient Instructions (Signed)
Medication Instructions:  No medication changes *If you need a refill on your cardiac medications before your next appointment, please call your pharmacy*   Lab Work: No labs ordered If you have labs (blood work) drawn today and your tests are completely normal, you will receive your results only by: MyChart Message (if you have MyChart) OR A paper copy in the mail If you have any lab test that is abnormal or we need to change your treatment, we will call you to review the results.   Testing/Procedures: Your physician has requested that you have an echocardiogram. Echocardiography is a painless test that uses sound waves to create images of your heart. It provides your doctor with information about the size and shape of your heart and how well your heart's chambers and valves are working. This procedure takes approximately one hour. There are no restrictions for this procedure. Please do NOT wear cologne, perfume, aftershave, or lotions (deodorant is allowed).   Please arrive 15 minutes prior to your appointment time.   CT coronary calcium score.   Test locations:  [facility]  [facility]  This is $99 out of pocket.   Coronary CalciumScan A coronary calcium scan is an imaging test used to look for deposits of calcium and other fatty materials (plaques) in the inner lining of the blood vessels of the heart (coronary arteries). These deposits of calcium and plaques can partly clog and narrow the coronary arteries without producing any symptoms or warning signs. This puts a person at risk for a heart attack. This test can detect these deposits before symptoms develop. Tell a health care provider about: Any allergies you have. All medicines you are taking, including vitamins, herbs, eye drops, creams, and over-the-counter medicines. Any problems you or family members have had with anesthetic  medicines. Any blood disorders you have. Any surgeries you have had. Any medical conditions you have. Whether you are pregnant or may be pregnant. What are the risks? Generally, this is a safe procedure. However, problems may occur, including: Harm to a pregnant woman and her unborn baby. This test involves the use of radiation. Radiation exposure can be dangerous to a pregnant woman and her unborn baby. If you are pregnant, you generally should not have this procedure done. Slight increase in the risk of cancer. This is because of the radiation involved in the test. What happens before the procedure? No preparation is needed for this procedure. What happens during the procedure? You will undress and remove any jewelry around your neck or chest. You will put on a hospital gown. Sticky electrodes will be placed on your chest. The electrodes will be connected to an electrocardiogram (ECG) machine to record a tracing of the electrical activity of your heart. A CT scanner will take pictures of your heart. During this time, you will be asked to lie still and hold your breath for 2-3 seconds while a picture of your heart is being taken. The procedure may vary among health care providers and hospitals. What happens after the procedure? You can get dressed. You can return to your normal activities. It is up to you to get the results of your test. Ask your health care provider, or the department that is doing the test, when your results will be ready. Summary A coronary calcium scan is an imaging test used to look for deposits of calcium and other fatty materials (plaques) in the inner  lining of the blood vessels of the heart (coronary arteries). Generally, this is a safe procedure. Tell your health care provider if you are pregnant or may be pregnant. No preparation is needed for this procedure. A CT scanner will take pictures of your heart. You can return to your normal activities after the scan  is done. This information is not intended to replace advice given to you by your health care provider. Make sure you discuss any questions you have with your health care provider. Document Released: 08/28/2007 Document Revised: 01/19/2016 Document Reviewed: 01/19/2016 Elsevier Interactive Patient Education  2017 [employer]   Please note: We ask at that you not bring children with you during ultrasound (echo/ vascular) testing. Due to room size and safety concerns, children are not allowed in the ultrasound rooms during exams. Our front office staff cannot provide observation of children in our lobby area while testing is being conducted. An adult accompanying a patient to their appointment will only be allowed in the ultrasound room at the discretion of the ultrasound technician under special circumstances. We apologize for any inconvenience.    Follow-Up: At [facility], you and your health needs are our priority.  As part of our continuing mission to provide you with exceptional heart care, we have created designated Provider Care Teams.  These Care Teams include your primary Cardiologist (physician) and Advanced Practice Providers (APPs -  Physician Assistants and Nurse Practitioners) who all work together to provide you with the care you need, when you need it.  We recommend signing up for the patient portal called "MyChart".  Sign up information is provided on this After Visit Summary.  MyChart is used to connect with patients for Virtual Visits (Telemedicine).  Patients are able to view lab/test results, encounter notes, upcoming appointments, etc.  Non-urgent messages can be sent to your provider as well.   To learn more about what you can do with MyChart, go to [email or web address].    Your next appointment:    As needed  Provider:   Dr. Nicki Guadalajara

## 2023-01-27 NOTE — Progress Notes (Signed)
Cardiology Office Note    Date:  02/07/2023   ID:  Jay Fox, DOB 1950/10/21, MRN [id number]  PCP:  Jay Olszewski, MD  Cardiologist:  Jay Guadalajara, MD   New cardiology evaluation referred by Dr. Glenetta Fox for palpitations.   History of Present Illness:  Jay Fox is a 72 y.o. male who is followed by Dr. Glenetta Fox for primary care.  He has a history of migraine headaches and has been referred to neurology.  He has remote history of prostate cancer and kidney cancer.  He has been on amlodipine 10 mg and irbesartan 300 mg for hypertension, pregabalin for restless leg syndrome, and takes Celexa for depression.  He uses inhaler with Jerral Ralph and as needed albuterol for asthma.  At times he has noted some occasional palpitations and has been felt to have JVD by Dr. Jon Fox.  Palpitations seem to occur more with increased migraine headaches.  He denies any exertional chest pain.  He has seen a vein doctor for venous insufficiency.  A lower extremity venous reflux study performed on January 04, 2022 showed deep vein reflux in the proximal femoral vein and popliteal vein and superficial vein reflux at the knee interpreted by Dr. Myra Fox.  He has a history of obstructive sleep apnea originally diagnosed in [place] and has been on BiPAP therapy and previously had a Philips Respironics BiPAP machine.  He tells me he received a new machine.  He believes he is sleeping well.  He denies any daytime sleepiness.  An Epworth scale was calculated in the office today and this endorsed at 2 arguing against daytime sleepiness.  He presents for cardiology evaluation.   Past Medical History:  Diagnosis Date   Allergy 1953   Altered olfactory perception 05/31/2022   Smells cigarettes sometimes     Anxiety    Arthritis    Asthma    BPH (benign prostatic hypertrophy)    BPH (benign prostatic hypertrophy)    Cancer (HCC)    Cataract    Chronic kidney disease    Depression    GERD  (gastroesophageal reflux disease)    History of kidney cancer    History of prostate cancer 11/30/2021   Hyperlipidemia 12/08/2022   Hypertension    Leg swelling 11/30/2021   Noticed early 2023 with ferritin deposition.    OSA (obstructive sleep apnea) 11/30/2021   Restless legs 11/30/2021   Seasonal allergies    Sleep apnea     Past Surgical History:  Procedure Laterality Date   CATARACT EXTRACTION, BILATERAL Bilateral 11/2022   10/24 left   NASAL SINUS SURGERY  2019   RENAL CRYOABLATION     TONSILLECTOMY      Current Medications: Outpatient Medications Prior to Visit  Medication Sig Dispense Refill   albuterol (PROVENTIL) (2.5 MG/3ML) 0.083% nebulizer solution Take 3 mLs (2.5 mg total) by nebulization every 6 (six) hours as needed for wheezing or shortness of breath. 150 mL 1   albuterol (VENTOLIN HFA) 108 (90 Base) MCG/ACT inhaler Inhale 2 puffs into the lungs every 6 (six) hours as needed for wheezing or shortness of breath. 18 g 11   amLODipine (NORVASC) 10 MG tablet Take 1 tablet (10 mg total) by mouth daily. 90 tablet 3   Ascorbic Acid (VITAMIN C PO) Take 1 tablet by mouth once. 500mg      budesonide-formoterol (BREYNA) 160-4.5 MCG/ACT inhaler Inhale 2 puffs into the lungs 2 (two) times daily.     calcium carbonate (SUPER CALCIUM) 1500 (  600 Ca) MG TABS tablet 1 tablet with meals Orally Twice a day for 30 day(s)     calcium carbonate (TUMS - DOSED IN MG ELEMENTAL CALCIUM) 500 MG chewable tablet Chew 1 tablet by mouth as needed.     Calcium Carbonate-Vit D-Min (CALCIUM 1200 PO) Take 1 tablet by mouth daily. (Patient not taking: Reported on 02/03/2023)     Cholecalciferol (VITAMIN D PO) Take 1 tablet by mouth daily. (Patient not taking: Reported on 02/03/2023)     Cholecalciferol (VITAMIN D3) 50 MCG (2000 UT) TABS Take 1 tablet by mouth daily.     diclofenac Sodium (VOLTAREN) 1 % GEL Place onto the skin.     fluticasone (FLONASE) 50 MCG/ACT nasal spray Place into the nose.      irbesartan (AVAPRO) 300 MG tablet Take 1 tablet by mouth once daily 90 tablet 0   ketorolac (ACULAR) 0.4 % SOLN Place 1 drop into both eyes 4 (four) times daily. 5 mL 2   Multiple Vitamin (MULTIVITAMIN) tablet Take 1 tablet by mouth daily.     Omega-3 Fatty Acids (FISH OIL) 1200 MG CAPS Take 1,200 mg by mouth in the morning and at bedtime.     pregabalin (LYRICA) 150 MG capsule Take 1 capsule (150 mg total) by mouth at bedtime. 90 capsule 3   Respiratory Therapy Supplies MISC Filters Hoses tubing masks headgear cleaning supplies for dreamwear and  philips respironics bipap machine. 2 each 0   Rimegepant Sulfate (NURTEC) 75 MG TBDP Take 1 tablet (75 mg total) by mouth daily as needed. 9 tablet 2   sildenafil (REVATIO) 20 MG tablet 1 tablet Orally Once a day     Spacer/Aero-Holding Chambers (AEROCHAMBER MV) inhaler Use as directed with albuterol inhaler. 1 each 0   TURMERIC PO Take 1,000 mg by mouth in the morning and at bedtime.     vitamin B-12 (CYANOCOBALAMIN) 500 MCG tablet Take 1 tablet by mouth daily.     zolpidem (AMBIEN) 10 MG tablet Take 1 tablet (10 mg total) by mouth at bedtime as needed for sleep. Can NOT take with opioid pain medicine, alcohol, or other sedative.  Risk dementia/mornings impaired alertness 90 tablet 1   citalopram (CELEXA) 10 MG tablet Take 1 tablet (10 mg total) by mouth daily. 90 tablet 1   No facility-administered medications prior to visit.     Allergies:   Shellfish allergy and Shellfish-derived products   Social History   Socioeconomic History   Marital status: Married    Spouse name: Not on file   Number of children: 3   Years of education: 12   Highest education level: 12th grade  Occupational History   Occupation: Retired  Tobacco Use   Smoking status: Never   Smokeless tobacco: Never  Vaping Use   Vaping status: Never Used  Substance and Sexual Activity   Alcohol use: Yes    Alcohol/week: 10.0 standard drinks of alcohol    Types: 10 Standard  drinks or equivalent per week    Comment: Varies sometimes not at all   Drug use: No   Sexual activity: Not Currently  Other Topics Concern   Not on file  Social History Narrative   Right handed   Caffeine none   Lives in one story home   Retired    Lives with wife   Social Determinants of Health   Financial Resource Strain: Low Risk  (12/28/2022)   Overall Financial Resource Strain (CARDIA)    Difficulty of Paying Living  Expenses: Not hard at all  Food Insecurity: No Food Insecurity (12/28/2022)   Hunger Vital Sign    Worried About Running Out of Food in the Last Year: Never true    Ran Out of Food in the Last Year: Never true  Transportation Needs: No Transportation Needs (12/28/2022)   PRAPARE - Administrator, Civil Service (Medical): No    Lack of Transportation (Non-Medical): No  Physical Activity: Sufficiently Active (12/28/2022)   Exercise Vital Sign    Days of Exercise per Week: 7 days    Minutes of Exercise per Session: 40 min  Stress: Stress Concern Present (12/28/2022)   [facility] [employer] - Occupational Stress Questionnaire    Feeling of Stress : To some extent  Social Connections: Socially Integrated (12/28/2022)   Social Connection and Isolation Panel [NHANES]    Frequency of Communication with Friends and Family: More than three times a week    Frequency of Social Gatherings with Friends and Family: Twice a week    Attends Religious Services: More than 4 times per year    Active Member of [employer] or Organizations: Yes    Attends Engineer, structural: More than 4 times per year    Marital Status: Married    Socially he was born in [place].  He lived in [place] for 20 years and moved to [place] in 2012.  He returned back to [facility] to care for family members in 2016 and return to [facility] 2021.  Family History:  The patient's family history includes Anxiety disorder in his mother; Cancer in  his father, mother, paternal grandfather, and paternal grandmother; Colon cancer in his paternal grandfather and paternal grandmother; Emphysema in his mother; Hypertension in his father and mother; Liver cancer in his mother; Multiple myeloma in his father; Pancreatic cancer in his mother.   ROS General: Negative; No fevers, chills, or night sweats;  HEENT: Negative; No changes in vision or hearing, sinus congestion, difficulty swallowing Pulmonary: Negative; No cough, wheezing, shortness of breath, hemoptysis Cardiovascular: Negative; No chest pain, presyncope, syncope, palpitations GI: Negative; No nausea, vomiting, diarrhea, or abdominal pain GU: Negative; No dysuria, hematuria, or difficulty voiding Musculoskeletal: Negative; no myalgias, joint pain, or weakness Hematologic/Oncology: Negative; no easy bruising, bleeding Endocrine: Negative; no heat/cold intolerance; no diabetes Neuro: Negative; no changes in balance, headaches Skin: Negative; No rashes or skin lesions Psychiatric: Negative; No behavioral problems, depression Sleep: Negative; No snoring, daytime sleepiness, hypersomnolence, bruxism, restless legs, hypnogognic hallucinations, no cataplexy Other comprehensive 14 point system review is negative.   PHYSICAL EXAM:   VS:  BP 128/88   Pulse (!) 57   Ht 5' 9.5" (1.765 m)   Wt 193 lb 3.2 oz (87.6 kg)   SpO2 97%   BMI 28.12 kg/m    Wt Readings from Last 3 Encounters:  02/03/23 188 lb (85.3 kg)  01/27/23 193 lb 3.2 oz (87.6 kg)  01/18/23 187 lb 12.8 oz (85.2 kg)    General: Alert, oriented, no distress.  Skin: normal turgor, no rashes, warm and dry HEENT: Normocephalic, atraumatic. Pupils equal round and reactive to light; sclera anicteric; extraocular muscles intact;  Nose without nasal septal hypertrophy Mouth/Parynx benign; Mallinpatti scale 3 Neck: No JVD, no carotid bruits; normal carotid upstroke Lungs: clear to ausculatation and percussion; no wheezing or  rales Chest wall: without tenderness to palpitation Heart: PMI not displaced, RRR, s1 s2 normal, 1/6 systolic murmur, no diastolic murmur, no rubs, gallops, thrills,  or heaves Abdomen: Diastases recti;soft, nontender; no hepatosplenomehaly, BS+; abdominal aorta nontender and not dilated by palpation. Back: no CVA tenderness Pulses 2+ Musculoskeletal: full range of motion, normal strength, no joint deformities Extremities: no clubbing cyanosis or edema, Homan's sign negative  Neurologic: grossly nonfocal; Cranial nerves grossly wnl Psychologic: Normal mood and affect   Studies/Labs Reviewed:   EKG Interpretation Date/Time:  Thursday January 27 2023 11:14:42 EST Ventricular Rate:  57 PR Interval:  132 QRS Duration:  110 QT Interval:  444 QTC Calculation: 432 R Axis:   -28  Text Interpretation: Sinus bradycardia Incomplete right bundle branch block No previous ECGs available Confirmed by Jay Fox ([id number]) on 01/27/2023 12:28:31 PM    Recent Labs:    Latest Ref Rng & Units 12/08/2022   10:36 AM 04/05/2022   12:17 PM 03/29/2022   11:14 AM  BMP  Glucose 70 - 99 mg/dL 528  413  244   BUN 6 - 23 mg/dL 17  19  20    Creatinine 0.40 - 1.50 mg/dL 0.10  2.72  5.36   Sodium 135 - 145 mEq/L 141  140  143   Potassium 3.5 - 5.1 mEq/L 4.1  4.2  4.7   Chloride 96 - 112 mEq/L 105  106  105   CO2 19 - 32 mEq/L 27  28  30    Calcium 8.4 - 10.5 mg/dL 9.9  64.4  03.4         Latest Ref Rng & Units 12/08/2022   10:36 AM 04/05/2022   12:17 PM 03/29/2022   11:14 AM  Hepatic Function  Total Protein 6.0 - 8.3 g/dL 7.1  7.0  6.8   Albumin 3.5 - 5.2 g/dL 4.5  4.4  4.5   AST 0 - 37 U/L 18  15  15    ALT 0 - 53 U/L 21  20  19    Alk Phosphatase 39 - 117 U/L 67  69  76   Total Bilirubin 0.2 - 1.2 mg/dL 0.7  0.5  0.5        Latest Ref Rng & Units 12/08/2022   10:36 AM 04/05/2022   12:17 PM 03/29/2022   11:14 AM  CBC  WBC 4.0 - 10.5 K/uL 6.0  5.8  6.6   Hemoglobin 13.0 - 17.0 g/dL 74.2  59.5   63.8   Hematocrit 39.0 - 52.0 % 42.9  43.7  43.3   Platelets 150.0 - 400.0 K/uL 240.0  220  226.0    Lab Results  Component Value Date   MCV 87.8 12/08/2022   MCV 88.1 04/05/2022   MCV 88.6 03/29/2022   Lab Results  Component Value Date   TSH 2.28 12/08/2022   Lab Results  Component Value Date   HGBA1C 5.9 12/08/2022     BNP No results found for: "BNP"  ProBNP No results found for: "PROBNP"   Lipid Panel     Component Value Date/Time   CHOL 134 12/08/2022 1036   TRIG 227.0 (H) 12/08/2022 1036   HDL 50.90 12/08/2022 1036   CHOLHDL 3 12/08/2022 1036   VLDL 45.4 (H) 12/08/2022 1036   LDLCALC 38 12/08/2022 1036     RADIOLOGY: No results found.   Additional studies/ records that were reviewed today include:  Records of Dr. Glenetta Fox were reviewed.   ASSESSMENT:    1. Primary hypertension   2. Mixed hyperlipidemia   3. Murmur   4. OSA on BiPAP   5. Heart palpitations   6. Venous  reflux   7. Mild intermittent asthma without complication   8. Restless leg syndrome      PLAN:  Mr. Voth is a 71 year old gentleman originally from [facility] [place].  He has a history of hypertension and blood pressure today is well-controlled on amlodipine 10 mg and irbesartan 300 mg daily.  He presented today for a "checkup "and admitted to some throbbing in his neck.  On exam there is no evidence for JVD or carotid bruits.  There was no hepatojugular reflux.  He has a history of obstructive sleep apnea and has been on BiPAP therapy for many years originally diagnosed in [place].  Tells me he recently received a new machine in place of his previous Philips BiPAP unit.  He has a history of venous insufficiency with reflux disease noted on Doppler imaging.  With history of some wheezing he is on albuterol andBreyna.  He is on pregabalin for restless legs.  Blood pressure today is minimally increased at 132/82 when checked by me.  He does have a short 1/6 systolic murmur  in the aortic region.  I am recommending he undergo an echo Doppler study for assessment of LV systolic and diastolic function, wall thickness, and valvular architecture.  He has a history of mixed hyperlipidemia and laboratory done by Dr. Jon Fox on December 08, 2022 showed total cholesterol 134 HDL 50.9 LDL was excellent at 38 but triglycerides were elevated at 227.  We discussed the importance of reduction in carbohydrates and sweets.  I am scheduling him for a screening coronary calcium score to assess for coronary calcification.  He typically walks his dog several times a day for 20 to 30 minutes on a daily basis.  I will contact him regarding the results of the above studies.  I will be available depending upon results as needed.   Medication Adjustments/Labs and Tests Ordered: Current medicines are reviewed at length with the patient today.  Concerns regarding medicines are outlined above.  Medication changes, Labs and Tests ordered today are listed in the Patient Instructions below. Patient Instructions  Medication Instructions:  No medication changes *If you need a refill on your cardiac medications before your next appointment, please call your pharmacy*   Lab Work: No labs ordered If you have labs (blood work) drawn today and your tests are completely normal, you will receive your results only by: MyChart Message (if you have MyChart) OR A paper copy in the mail If you have any lab test that is abnormal or we need to change your treatment, we will call you to review the results.   Testing/Procedures: Your physician has requested that you have an echocardiogram. Echocardiography is a painless test that uses sound waves to create images of your heart. It provides your doctor with information about the size and shape of your heart and how well your heart's chambers and valves are working. This procedure takes approximately one hour. There are no restrictions for this procedure. Please  do NOT wear cologne, perfume, aftershave, or lotions (deodorant is allowed).   Please arrive 15 minutes prior to your appointment time.   CT coronary calcium score.   Test locations:  [facility]  [facility]  This is $99 out of pocket.   Coronary CalciumScan A coronary calcium scan is an imaging test used to look for deposits of calcium and other fatty materials (plaques) in the inner lining of the blood  vessels of the heart (coronary arteries). These deposits of calcium and plaques can partly clog and narrow the coronary arteries without producing any symptoms or warning signs. This puts a person at risk for a heart attack. This test can detect these deposits before symptoms develop. Tell a health care provider about: Any allergies you have. All medicines you are taking, including vitamins, herbs, eye drops, creams, and over-the-counter medicines. Any problems you or family members have had with anesthetic medicines. Any blood disorders you have. Any surgeries you have had. Any medical conditions you have. Whether you are pregnant or may be pregnant. What are the risks? Generally, this is a safe procedure. However, problems may occur, including: Harm to a pregnant woman and her unborn baby. This test involves the use of radiation. Radiation exposure can be dangerous to a pregnant woman and her unborn baby. If you are pregnant, you generally should not have this procedure done. Slight increase in the risk of cancer. This is because of the radiation involved in the test. What happens before the procedure? No preparation is needed for this procedure. What happens during the procedure? You will undress and remove any jewelry around your neck or chest. You will put on a hospital gown. Sticky electrodes will be placed on your chest. The electrodes will be connected to an electrocardiogram (ECG)  machine to record a tracing of the electrical activity of your heart. A CT scanner will take pictures of your heart. During this time, you will be asked to lie still and hold your breath for 2-3 seconds while a picture of your heart is being taken. The procedure may vary among health care providers and hospitals. What happens after the procedure? You can get dressed. You can return to your normal activities. It is up to you to get the results of your test. Ask your health care provider, or the department that is doing the test, when your results will be ready. Summary A coronary calcium scan is an imaging test used to look for deposits of calcium and other fatty materials (plaques) in the inner lining of the blood vessels of the heart (coronary arteries). Generally, this is a safe procedure. Tell your health care provider if you are pregnant or may be pregnant. No preparation is needed for this procedure. A CT scanner will take pictures of your heart. You can return to your normal activities after the scan is done. This information is not intended to replace advice given to you by your health care provider. Make sure you discuss any questions you have with your health care provider. Document Released: 08/28/2007 Document Revised: 01/19/2016 Document Reviewed: 01/19/2016 Elsevier Interactive Patient Education  2017 Elsevier Inc.   Please note: We ask at that you not bring children with you during ultrasound (echo/ vascular) testing. Due to room size and safety concerns, children are not allowed in the ultrasound rooms during exams. Our front office staff cannot provide observation of children in our lobby area while testing is being conducted. An adult accompanying a patient to their appointment will only be allowed in the ultrasound room at the discretion of the ultrasound technician under special circumstances. We apologize for any inconvenience.    Follow-Up: At [facility], you and  your health needs are our priority.  As part of our continuing mission to provide you with exceptional heart care, we have created designated Provider Care Teams.  These Care Teams include your primary Cardiologist (physician) and Advanced Practice Providers (APPs -  Physician Assistants and Nurse Practitioners) who all work together to provide you with the care you need, when you need it.  We recommend signing up for the patient portal called "MyChart".  Sign up information is provided on this After Visit Summary.  MyChart is used to connect with patients for Virtual Visits (Telemedicine).  Patients are able to view lab/test results, encounter notes, upcoming appointments, etc.  Non-urgent messages can be sent to your provider as well.   To learn more about what you can do with MyChart, go to [email or web address].    Your next appointment:    As needed  Provider:   Dr. Nicki Fox       Signed, Jay Guadalajara, MD  02/07/2023 2:06 PM    [facility] [address], [place], [place]  [address] Phone: [phone]

## 2023-02-03 ENCOUNTER — Ambulatory Visit: Payer: Medicare Other | Admitting: Neurology

## 2023-02-03 ENCOUNTER — Other Ambulatory Visit: Payer: Medicare Other

## 2023-02-03 ENCOUNTER — Encounter: Payer: Self-pay | Admitting: Neurology

## 2023-02-03 VITALS — BP 135/68 | HR 62 | Ht 69.5 in | Wt 188.0 lb

## 2023-02-03 DIAGNOSIS — E785 Hyperlipidemia, unspecified: Secondary | ICD-10-CM

## 2023-02-03 DIAGNOSIS — R519 Headache, unspecified: Secondary | ICD-10-CM

## 2023-02-03 DIAGNOSIS — H269 Unspecified cataract: Secondary | ICD-10-CM | POA: Diagnosis not present

## 2023-02-03 DIAGNOSIS — R011 Cardiac murmur, unspecified: Secondary | ICD-10-CM

## 2023-02-03 DIAGNOSIS — H53149 Visual discomfort, unspecified: Secondary | ICD-10-CM

## 2023-02-03 DIAGNOSIS — I10 Essential (primary) hypertension: Secondary | ICD-10-CM

## 2023-02-03 MED ORDER — VENLAFAXINE HCL ER 37.5 MG PO CP24: 75.0000 mg | ORAL_CAPSULE | Freq: Every day | ORAL | 5 refills | Status: DC

## 2023-02-03 NOTE — Patient Instructions (Addendum)
I saw you today for light sensitivity and headaches. I would like to do some lab work today to look for other causes of your symptoms. I will be in touch when I have those results.  For your headaches: Headache prevention:  Venlafaxine 37.5 mg for 1 week, then increase to 75 mg daily thereafter. Please stop citalopram as you should not take these medications together. Headache rescue:  Continue Nurtec 75 mg as needed at headache onset  Continue Lyrica 150 mg bedtime for restless leg syndrome.  Return to clinic in 3 months  The physicians and staff at [facility] are committed to providing excellent care. You may receive a survey requesting feedback about your experience at our office. We strive to receive "very good" responses to the survey questions. If you feel that your experience would prevent you from giving the office a "very good " response, please contact our office to try to remedy the situation. We may be reached at [phone]. Thank you for taking the time out of your busy day to complete the survey.  Jacquelyne Balint, MD [facility]  More headache/migraine information: Be aware of common food triggers:  - Caffeine:  coffee, black tea, cola, Mt. Dew  - Chocolate  - Dairy:  aged cheeses (brie, blue, cheddar, gouda, [place], provolone, [place], Swiss, etc), chocolate milk, buttermilk, sour cream, limit eggs and yogurt  - Nuts, peanut butter  - Alcohol  - Cereals/grains:  FRESH breads (fresh bagels, sourdough, doughnuts), yeast productions  - Processed/canned/aged/cured meats (pre-packaged deli meats, hotdogs)  - MSG/glutamate:  soy sauce, flavor enhancer, pickled/preserved/marinated foods  - Sweeteners:  aspartame (Equal, Nutrasweet).  Sugar and Splenda are okay  - Vegetables:  legumes (lima beans, lentils, snow peas, fava beans, pinto peans, peas, garbanzo beans), sauerkraut, onions, olives, pickles  - Fruit:  avocados, bananas, citrus fruit (orange, lemon, grapefruit),  mango  - Other:  Frozen meals, macaroni and cheese Routine exercise Stay adequately hydrated (aim for 64 oz water daily) Keep headache diary Maintain proper stress management Maintain proper sleep hygiene Do not skip meals Consider supplements:  magnesium citrate 400mg  daily, riboflavin 400mg  daily, coenzyme Q10 100mg  three times daily.

## 2023-02-04 LAB — SEDIMENTATION RATE: Sed Rate: 9 mm/h (ref 0–20)

## 2023-02-04 LAB — C-REACTIVE PROTEIN: CRP: <3 mg/L (ref <8.0)

## 2023-02-07 ENCOUNTER — Encounter: Payer: Self-pay | Admitting: Cardiovascular Disease

## 2023-02-11 ENCOUNTER — Encounter: Payer: Self-pay | Admitting: Internal Medicine

## 2023-02-17 DIAGNOSIS — R519 Headache, unspecified: Secondary | ICD-10-CM | POA: Diagnosis not present

## 2023-02-17 DIAGNOSIS — H539 Unspecified visual disturbance: Secondary | ICD-10-CM | POA: Diagnosis not present

## 2023-02-17 DIAGNOSIS — H53143 Visual discomfort, bilateral: Secondary | ICD-10-CM | POA: Diagnosis not present

## 2023-02-22 DIAGNOSIS — M51361 Other intervertebral disc degeneration, lumbar region with lower extremity pain only: Secondary | ICD-10-CM | POA: Diagnosis not present

## 2023-02-22 DIAGNOSIS — M9903 Segmental and somatic dysfunction of lumbar region: Secondary | ICD-10-CM | POA: Diagnosis not present

## 2023-02-23 ENCOUNTER — Ambulatory Visit (HOSPITAL_BASED_OUTPATIENT_CLINIC_OR_DEPARTMENT_OTHER)
Admission: RE | Admit: 2023-02-23 | Discharge: 2023-02-23 | Disposition: A | Discharge status: Home / Self Care | Payer: Medicare Other | Source: Ambulatory Visit | Attending: Cardiovascular Disease | Admitting: Cardiovascular Disease

## 2023-02-23 ENCOUNTER — Ambulatory Visit (INDEPENDENT_AMBULATORY_CARE_PROVIDER_SITE_OTHER): Payer: Medicare Other

## 2023-02-23 DIAGNOSIS — E785 Hyperlipidemia, unspecified: Secondary | ICD-10-CM | POA: Insufficient documentation

## 2023-02-23 DIAGNOSIS — R011 Cardiac murmur, unspecified: Secondary | ICD-10-CM | POA: Diagnosis not present

## 2023-02-23 DIAGNOSIS — I10 Essential (primary) hypertension: Secondary | ICD-10-CM | POA: Insufficient documentation

## 2023-02-23 LAB — ECHOCARDIOGRAM COMPLETE
Area-P 1/2: 3.03 cm2
P 1/2 time: 935 ms
S' Lateral: 2.95 cm

## 2023-02-25 ENCOUNTER — Encounter: Payer: Self-pay | Admitting: Internal Medicine

## 2023-02-25 ENCOUNTER — Ambulatory Visit (INDEPENDENT_AMBULATORY_CARE_PROVIDER_SITE_OTHER): Payer: Medicare Other | Admitting: Internal Medicine

## 2023-02-25 VITALS — BP 132/78 | HR 84 | Temp 98.2°F | Ht 69.5 in | Wt 189.4 lb

## 2023-02-25 DIAGNOSIS — H6521 Chronic serous otitis media, right ear: Secondary | ICD-10-CM

## 2023-02-25 DIAGNOSIS — J320 Chronic maxillary sinusitis: Secondary | ICD-10-CM

## 2023-02-25 DIAGNOSIS — J069 Acute upper respiratory infection, unspecified: Secondary | ICD-10-CM | POA: Diagnosis not present

## 2023-02-25 MED ORDER — LORATADINE 10 MG PO TABS: 10.0000 mg | ORAL_TABLET | Freq: Every day | ORAL | 11 refills | Status: DC

## 2023-02-25 MED ORDER — AMOXICILLIN-POT CLAVULANATE 875-125 MG PO TABS: 1.0000 | ORAL_TABLET | Freq: Two times a day (BID) | ORAL | 1 refills | Status: DC

## 2023-02-25 MED ORDER — SIMPLY SALINE 0.9 % NA AERS: 2.0000 | INHALATION_SPRAY | NASAL | 11 refills | Status: AC

## 2023-02-25 MED ORDER — PSEUDOEPHEDRINE HCL ER 120 MG PO TB12: 120.0000 mg | ORAL_TABLET | Freq: Two times a day (BID) | ORAL | 0 refills | Status: DC

## 2023-02-25 MED ORDER — FLUTICASONE PROPIONATE 50 MCG/ACT NA SUSP: 2.0000 | Freq: Every day | NASAL | 6 refills | Status: AC

## 2023-02-25 NOTE — Patient Instructions (Addendum)
This shows  how the eustachian canal works to drain the inner ear and how it is connected to the nasopharynx.  Nasal sinus rinses with saline nasal mist sprays can rinse all of the pollen and allergens and other irritants and pathogens out of his sinuses and nasopharynx, reducing the plugging/swelling around the eustachian tube.  The sinus rinse clears away the mucus and allows nasal steroid sprays to help reach the eustachian tube and reduce swellling to open it up.    Basic Sinus Care: Mist each nostril nightly with sterile saline nasal mist, then spray each nostril immediately after with fluticasone nasal spray (Flonase) or other steroid or antihistamine nasal pray Next, if symptoms persist, add a daily allergy pill.  Benadryl is the strongest but will make you very drowsy so only take when you can sleep after.  Ear Pressure Relief Maneuvers: Step 1 If the congestion is mild, you can often use simple maneuvers to quickly alter the pressure in your middle ear, such as: Swallowing Yawning Chewing gum Sucking on hard candy Similar methods can be used on children. If traveling with an infant or toddler, try giving them a bottle, pacifier, or something to drink or suck on.  Warm compress: Applying a warm, moist cloth to the back of your ear can help reduce swelling and help drain congested passages. In some cases, these interventions will cause the ears will pop without trying. If they don't, give it 20 minutes and see if swallowing, yawning, chewing gum, or sucking on hard candy helps.  Step 2 If these methods alone don't help, you can try other interventions like:  Decongestants: OTC drugs like Afrin (oxymetazoline) or Sudafed (pseudoephedrine) work by reducing the swelling of blood vessels in the nasal passages and Eustachian tubes.  Never use these medications for more than a few days at a time, especially afrin is dependency-forming  If this isn't working or you need more than a few days  of afrin or sudafed... you should make an appointment.   Step 3 (just for mod severe ear pressure and pain) If these interventions don't help, there are three other advanced strategies you can try called the Valsalva maneuver, the Toynbee maneuver, and the Frenzel maneuver.  Advanced Strategy 1:  The Valsalva maneuver Inhale. Pinch your nose shut with your fingers. Keeping your lips tightly shut, blow out forcefully as if you are blowing up a balloon. To increase the pressure, try bearing down as if having a bowel movement.  Advanced Strategy 2:  The Toynbee maneuver The Toynbee maneuver may also be safer than the Valsalva maneuver if you've had a previous eardrum injury. The Valsalva method exerts much more pressure on the eardrum and can possibly cause a rupture if you blow too forcefully.  Keep your mouth tightly shut. Pinch your nose shut with your fingers. Swallow hard.  Advanced Strategy 3: The Frenzel maneuver Pinch your nose shut with your fingers Close your mouth and place the tip of your tongue behind your upper front teeth. Push the back of your tongue to the roof of your mouth as if making a hard "G" or "K" sound. The back of your tongue will touch the roof. While doing this, close your vocal folds at the back of your throat and lift your larynx (voice box) up to push the air out of your mouth and into your nose.  ------------------------------------------------------------------------ If all this fails despite extensive efforts then you need to go to an ear nose and throat for   surgical correction - but this should not be tried until everything else fails.      

## 2023-02-26 NOTE — Progress Notes (Signed)
[facility]: [phone]   -- Medical Office Visit --  Patient:  Jay Fox      Age: 72 y.o.       Sex:  male  Date:   02/25/2023 Today's Healthcare Provider: Lula Olszewski, MD  ==========================================================================    Assessment & Plan Upper respiratory tract infection, unspecified type Sinusitis with Otitis Media   He presents with a two-week history of sinus infection, cough, and ear fullness. Examination reveals a significant right ear infection with blood behind the eardrum, indicating otitis media, and inflamed sinuses suggest bacterial sinusitis. He is experiencing hearing loss due to the ear infection. Aggressive treatment is recommended to prevent potential permanent hearing loss. We discussed the risks of Sudafed, including potential hypertension, and the benefits of Simply Saline extra strength nasal rinse for better dissolution of nasal congestion. We explained that antibiotics alone cannot reach the infection behind the eardrum without nasal rinses to clear the Eustachian tubes. We will prescribe Augmentin, recommend Simply Saline extra strength nasal rinse, advise cautious use of Sudafed due to potential hypertension, instruct on using Flonase after nasal rinses, and educate on techniques to open Eustachian tubes, including the Valsalva maneuver and chewing gum. Right chronic serous otitis media  Chronic maxillary sinusitis        Orders Placed During this Encounter:   ED Discharge Orders          Ordered    fluticasone (FLONASE) 50 MCG/ACT nasal spray  Daily        02/25/23 1122    Saline (SIMPLY SALINE) 0.9 % AERS  As directed        02/25/23 1122    loratadine (CLARITIN) 10 MG tablet  Daily        02/25/23 1122    pseudoephedrine (SUDAFED 12 HOUR) 120 MG 12 hr tablet  2 times daily        02/25/23 1122    amoxicillin-clavulanate (AUGMENTIN) 875-125 MG tablet  2 times daily         02/25/23 1122          He reports improvement in migraine symptoms with the current medication prescribed by his neurologist, experiencing no migraines for the past two to three weeks. We will continue the current migraine medication as prescribed by the neurologist.  General Health Maintenance   He is following up with various specialists for chronic conditions, including recent cardiology and ophthalmology visits. The cardiologist found no significant issues; recent calcium tests and echocardiograms were normal. The eye doctor recommended rose-colored glasses for better healing. We will continue wearing rose-colored glasses as recommended by the eye doctor and follow up with the cardiologist as needed.  Follow-up   We advise following up if symptoms worsen or do not improve and consider an ER visit if the condition deteriorates significantly.   SUBJECTIVE: 72 y.o. male who has Asthma; Hypertension; Seasonal allergies; Urinary incontinence; Skin lesions, generalized; Prostate cancer (HCC); Insomnia; Heart palpitations; Benign essential hypertension; Asthma, moderate persistent; History of renal cell carcinoma; Personal history of prostate cancer; History of radical prostatectomy; Leg swelling; Restless leg syndrome; Prediabetes; OSA (obstructive sleep apnea); Back wound; Generalized abdominal pain; High risk medication use; Abdominal distension; Altered olfactory perception; Jugular venous distension; Cervical lymphadenopathy; and Hyperlipidemia on their problem list.  Main reasons for visit/main concerns/chief complaint: Cough (Pt c/o of cold for 2 weeks w/ cough - pt's grandson was here 3 weeks ago sick, pt has tried OTC meds, did not help)  and Ear Fullness    AI-Extracted: Discussed the use of AI scribe software for clinical note transcription with the patient, who gave verbal consent to proceed.  History of Present Illness The patient, with a history of chronic sinusitis and ear  infections, presents with a two-week history of worsening symptoms. He reports a persistent cough and describes a sensation of fullness in the ears, despite attempts at self-management with warm water syringing. The patient denies any improvement in symptoms with over-the-counter medications such as Claritin D, which was previously recommended for a similar condition in his grandson.  The patient also reports a recent history of migraines and eye discomfort, for which he has been prescribed medication by a neurologist. He notes a significant improvement in these symptoms since starting the medication. He has also been evaluated by a cardiologist and undergone a calcium test and echocardiogram, the results of which were reportedly normal.  The patient's primary concern at this time is the loss of hearing, particularly in the right ear, which he attributes to the ongoing ear and sinus infection. He expresses a willingness to try more aggressive treatment measures to resolve the current infection and prevent potential permanent hearing loss.   Note that patient  has a past medical history of Allergy (1953), Altered olfactory perception (05/31/2022), Anxiety, Arthritis, Asthma, BPH (benign prostatic hypertrophy), BPH (benign prostatic hypertrophy), Cancer (HCC), Cataract, Chronic kidney disease, Depression, GERD (gastroesophageal reflux disease), History of kidney cancer, History of prostate cancer (11/30/2021), Hyperlipidemia (12/08/2022), Hypertension, Leg swelling (11/30/2021), OSA (obstructive sleep apnea) (11/30/2021), Restless legs (11/30/2021), Seasonal allergies, and Sleep apnea.  Problem list overviews that were updated at today's visit:No problems updated.  Med reconciliation: Current Outpatient Medications on File Prior to Visit  Medication Sig   albuterol (PROVENTIL) (2.5 MG/3ML) 0.083% nebulizer solution Take 3 mLs (2.5 mg total) by nebulization every 6 (six) hours as needed for wheezing or  shortness of breath.   albuterol (VENTOLIN HFA) 108 (90 Base) MCG/ACT inhaler Inhale 2 puffs into the lungs every 6 (six) hours as needed for wheezing or shortness of breath.   amLODipine (NORVASC) 10 MG tablet Take 1 tablet (10 mg total) by mouth daily.   Ascorbic Acid (VITAMIN C PO) Take 1 tablet by mouth once. 500mg    budesonide-formoterol (BREYNA) 160-4.5 MCG/ACT inhaler Inhale 2 puffs into the lungs 2 (two) times daily.   calcium carbonate (SUPER CALCIUM) 1500 (600 Ca) MG TABS tablet 1 tablet with meals Orally Twice a day for 30 day(s)   calcium carbonate (TUMS - DOSED IN MG ELEMENTAL CALCIUM) 500 MG chewable tablet Chew 1 tablet by mouth as needed.   Calcium Carbonate-Vit D-Min (CALCIUM 1200 PO) Take 1 tablet by mouth daily.   Cholecalciferol (VITAMIN D PO) Take 1 tablet by mouth daily.   Cholecalciferol (VITAMIN D3) 50 MCG (2000 UT) TABS Take 1 tablet by mouth daily.   diclofenac Sodium (VOLTAREN) 1 % GEL Place onto the skin.   irbesartan (AVAPRO) 300 MG tablet Take 1 tablet by mouth once daily   ketorolac (ACULAR) 0.4 % SOLN Place 1 drop into both eyes 4 (four) times daily.   Multiple Vitamin (MULTIVITAMIN) tablet Take 1 tablet by mouth daily.   Omega-3 Fatty Acids (FISH OIL) 1200 MG CAPS Take 1,200 mg by mouth in the morning and at bedtime.   pregabalin (LYRICA) 150 MG capsule Take 1 capsule (150 mg total) by mouth at bedtime.   Respiratory Therapy Supplies MISC Filters Hoses tubing masks headgear cleaning supplies for dreamwear and  philips respironics bipap machine.   Rimegepant Sulfate (NURTEC) 75 MG TBDP Take 1 tablet (75 mg total) by mouth daily as needed.   sildenafil (REVATIO) 20 MG tablet 1 tablet Orally Once a day   Spacer/Aero-Holding Chambers (AEROCHAMBER MV) inhaler Use as directed with albuterol inhaler.   TURMERIC PO Take 1,000 mg by mouth in the morning and at bedtime.   venlafaxine XR (EFFEXOR XR) 37.5 MG 24 hr capsule Take 2 capsules (75 mg total) by mouth daily with  breakfast. Take 1 capsule (37.5 mg) by mouth for 1 week, then increase to 2 capsules (75 mg) thereafter   vitamin B-12 (CYANOCOBALAMIN) 500 MCG tablet Take 1 tablet by mouth daily.   zolpidem (AMBIEN) 10 MG tablet Take 1 tablet (10 mg total) by mouth at bedtime as needed for sleep. Can NOT take with opioid pain medicine, alcohol, or other sedative.  Risk dementia/mornings impaired alertness   No current facility-administered medications on file prior to visit.   Medications Discontinued During This Encounter  Medication Reason   fluticasone (FLONASE) 50 MCG/ACT nasal spray      Objective   Physical Exam     02/25/2023   10:54 AM 02/03/2023    9:12 AM 01/27/2023   11:10 AM  Vitals with BMI  Height 5' 9.5" 5' 9.5" 5' 9.5"  Weight 189 lbs 6 oz 188 lbs 193 lbs 3 oz  BMI 27.58 27.37 28.13  Systolic 132 135 469  Diastolic 78 68 88  Pulse 84 62 57   Wt Readings from Last 10 Encounters:  02/25/23 189 lb 6.4 oz (85.9 kg)  02/03/23 188 lb (85.3 kg)  01/27/23 193 lb 3.2 oz (87.6 kg)  01/18/23 187 lb 12.8 oz (85.2 kg)  12/28/22 186 lb 12.8 oz (84.7 kg)  12/28/22 187 lb 12.8 oz (85.2 kg)  12/08/22 188 lb 2 oz (85.3 kg)  09/22/22 187 lb 3.2 oz (84.9 kg)  09/03/22 186 lb (84.4 kg)  05/31/22 185 lb 12.8 oz (84.3 kg)   Vital signs reviewed.  Nursing notes reviewed. Weight trend reviewed. Abnormalities and Problem-Specific physical exam findings:  Physical Exam HEENT: Left ear canal clear, right ear canal with wax and blood behind eardrum. Nasal mucosa erythematous and inflamed. Tonsillar pillars enlarged, swelling where tonsils should be. NECK: No tenderness on palpation of lymph nodes. CHEST: Lungs clear to auscultation.  General Appearance:  No acute distress appreciable.   Well-groomed, healthy-appearing male.  Well proportioned with no abnormal fat distribution.  Good muscle tone. Pulmonary:  Normal work of breathing at rest, no respiratory distress apparent. SpO2: 96 %   Musculoskeletal: All extremities are intact.  Neurological:  Awake, alert, oriented, and engaged.  No obvious focal neurological deficits or cognitive impairments.  Sensorium seems unclouded.   Speech is clear and coherent with logical content. Psychiatric:  Appropriate mood, pleasant and cooperative demeanor, thoughtful and engaged during the exam  Results            No results found for any visits on 02/25/23.  Office Visit on 02/03/2023  Component Date Value   S' Lateral 02/23/2023 2.95    Area-P 1/2 02/23/2023 3.03    P 1/2 time 02/23/2023 935    Est EF 02/23/2023 60 - 65%    Sed Rate 02/03/2023 9    CRP 02/03/2023 <3.0   Clinical Support on 12/28/2022  Component Date Value   Cologuard 06/07/2022 Negative   Office Visit on 12/08/2022  Component Date Value   WBC 12/08/2022 6.0  RBC 12/08/2022 4.89    Platelets 12/08/2022 240.0    Hemoglobin 12/08/2022 14.2    HCT 12/08/2022 42.9    MCV 12/08/2022 87.8    MCHC 12/08/2022 33.1    RDW 12/08/2022 14.1    Sodium 12/08/2022 141    Potassium 12/08/2022 4.1    Chloride 12/08/2022 105    CO2 12/08/2022 27    Glucose, Bld 12/08/2022 109 (H)    BUN 12/08/2022 17    Creatinine, Ser 12/08/2022 1.02    Total Bilirubin 12/08/2022 0.7    Alkaline Phosphatase 12/08/2022 67    AST 12/08/2022 18    ALT 12/08/2022 21    Total Protein 12/08/2022 7.1    Albumin 12/08/2022 4.5    GFR 12/08/2022 73.99    Calcium 12/08/2022 9.9    Cholesterol 12/08/2022 134    Triglycerides 12/08/2022 227.0 (H)    HDL 12/08/2022 50.90    VLDL 12/08/2022 45.4 (H)    LDL Cholesterol 12/08/2022 38    Total CHOL/HDL Ratio 12/08/2022 3    NonHDL 12/08/2022 83.39    TSH 12/08/2022 2.28    Hgb A1c MFr Bld 12/08/2022 5.9   Office Visit on 05/31/2022  Component Date Value   COLOGUARD 06/07/2022 Negative   Orders Only on 04/07/2022  Component Date Value   Total Protein, Urine 04/07/2022 7.9    Total Protein, Urine-Ur/* 04/07/2022 122    Albumin, U  04/07/2022 50.3    ALPHA 1 URINE 04/07/2022 9.4    Alpha 2, Urine 04/07/2022 11.9    % BETA, Urine 04/07/2022 23.1    GAMMA GLOBULIN URINE 04/07/2022 5.4    Free Kappa Lt Chains,Ur 04/07/2022 12.36    Free Lambda Lt Chains,Ur 04/07/2022 1.76    Free Kappa/Lambda Ratio 04/07/2022 7.02    Immunofixation Result, U* 04/07/2022 Comment    Total Volume 04/07/2022 1,550    M-SPIKE %, Urine 04/07/2022 Not Observed    Note: 04/07/2022 Comment   Appointment on 04/05/2022  Component Date Value   LDH 04/05/2022 131    Beta-2 Microglobulin 04/05/2022 1.5    Kappa free light chain 04/05/2022 18.5    Lambda free light chains 04/05/2022 14.1    Kappa, lambda light chai* 04/05/2022 1.31    IgG (Immunoglobin G), Se* 04/05/2022 787    IgA 04/05/2022 149    IgM (Immunoglobulin M), * 04/05/2022 88    Total Protein ELP 04/05/2022 6.6    Albumin SerPl Elph-Mcnc 04/05/2022 3.8    Alpha 1 04/05/2022 0.2    Alpha2 Glob SerPl Elph-M* 04/05/2022 0.7    B-Globulin SerPl Elph-Mc* 04/05/2022 1.0    Gamma Glob SerPl Elph-Mc* 04/05/2022 0.9    M Protein SerPl Elph-Mcnc 04/05/2022 Not Observed    Globulin, Total 04/05/2022 2.8    Albumin/Glob SerPl 04/05/2022 1.4    IFE 1 04/05/2022 Comment    Please Note 04/05/2022 Comment    Sodium 04/05/2022 140    Potassium 04/05/2022 4.2    Chloride 04/05/2022 106    CO2 04/05/2022 28    Glucose, Bld 04/05/2022 110 (H)    BUN 04/05/2022 19    Creatinine 04/05/2022 0.97    Calcium 04/05/2022 10.2    Total Protein 04/05/2022 7.0    Albumin 04/05/2022 4.4    AST 04/05/2022 15    ALT 04/05/2022 20    Alkaline Phosphatase 04/05/2022 69    Total Bilirubin 04/05/2022 0.5    GFR, Estimated 04/05/2022 >60    Anion gap 04/05/2022 6    WBC Count  04/05/2022 5.8    RBC 04/05/2022 4.96    Hemoglobin 04/05/2022 14.7    HCT 04/05/2022 43.7    MCV 04/05/2022 88.1    MCH 04/05/2022 29.6    MCHC 04/05/2022 33.6    RDW 04/05/2022 13.1    Platelet Count 04/05/2022 220     nRBC 04/05/2022 0.0    Neutrophils Relative % 04/05/2022 62    Neutro Abs 04/05/2022 3.6    Lymphocytes Relative 04/05/2022 20    Lymphs Abs 04/05/2022 1.1    Monocytes Relative 04/05/2022 11    Monocytes Absolute 04/05/2022 0.6    Eosinophils Relative 04/05/2022 6    Eosinophils Absolute 04/05/2022 0.4    Basophils Relative 04/05/2022 1    Basophils Absolute 04/05/2022 0.1    Immature Granulocytes 04/05/2022 0    Abs Immature Granulocytes 04/05/2022 0.02   Office Visit on 03/29/2022  Component Date Value   Amylase 03/29/2022 50    Lipase 03/29/2022 20.0    PSA 03/29/2022 0.00 Repeated and verified X2. (L)    Sodium 03/29/2022 143    Potassium 03/29/2022 4.7    Chloride 03/29/2022 105    CO2 03/29/2022 30    Glucose, Bld 03/29/2022 111 (H)    BUN 03/29/2022 20    Creatinine, Ser 03/29/2022 0.98    Total Bilirubin 03/29/2022 0.5    Alkaline Phosphatase 03/29/2022 76    AST 03/29/2022 15    ALT 03/29/2022 19    Total Protein 03/29/2022 6.8    Albumin 03/29/2022 4.5    GFR 03/29/2022 78.01    Calcium 03/29/2022 10.1    WBC 03/29/2022 6.6    RBC 03/29/2022 4.89    Platelets 03/29/2022 226.0    Hemoglobin 03/29/2022 14.5    HCT 03/29/2022 43.3    MCV 03/29/2022 88.6    MCHC 03/29/2022 33.4    RDW 03/29/2022 13.6   Lab on 03/03/2022  Component Date Value   Immunofix Electr Int 03/03/2022     Immunoglobulin A 03/03/2022 151    IgG (Immunoglobin G), Se* 03/03/2022 836    IgM, Serum 03/03/2022 92    No image results found.   ECHOCARDIOGRAM COMPLETE Result Date: 02/23/2023    ECHOCARDIOGRAM REPORT   Patient Name:   Jay Fox Date of Exam: 02/23/2023 Medical Rec #:  [id number]      Height:       69.5 in Accession #:    [id number]     Weight:       188.0 lb Date of Birth:  March 02, 1951      BSA:          2.022 m Patient Age:    72 years       BP:           121/76 mmHg Patient Gender: M              HR:           55 bpm. Exam Location:  Outpatient Procedure: 2D Echo, 3D Echo,  Color Doppler, Cardiac Doppler and Strain Analysis Indications:    R01.1 Murmur  History:        Patient has no prior history of Echocardiogram examinations.                 Signs/Symptoms:Murmur; Risk Factors:Hypertension, Non-Smoker and                 Dyslipidemia. Prostate Cancer.  Sonographer:    Jeryl Columbia RDCS Referring Phys: [phone] THOMAS A KELLY IMPRESSIONS  1. Left ventricular ejection fraction, by estimation, is 60 to 65%. Left ventricular ejection fraction by 3D volume is 61 %. The left ventricle has normal function. The left ventricle has no regional wall motion abnormalities. There is mild left ventricular hypertrophy of the basal-septal segment. Left ventricular diastolic parameters are indeterminate. The average left ventricular global longitudinal strain is -16.3 %. The global longitudinal strain is abnormal.  2. Right ventricular systolic function is normal. The right ventricular size is normal. There is normal pulmonary artery systolic pressure.  3. Left atrial size was moderately dilated.  4. The mitral valve is normal in structure. No evidence of mitral valve regurgitation. No evidence of mitral stenosis.  5. The aortic valve is tricuspid. Aortic valve regurgitation is not visualized. Aortic valve sclerosis is present, with no evidence of aortic valve stenosis.  6. The inferior vena cava is dilated in size with >50% respiratory variability, suggesting right atrial pressure of 8 mmHg. Comparison(s): No prior Echocardiogram. FINDINGS  Left Ventricle: Left ventricular ejection fraction, by estimation, is 60 to 65%. Left ventricular ejection fraction by 3D volume is 61 %. The left ventricle has normal function. The left ventricle has no regional wall motion abnormalities. The average left ventricular global longitudinal strain is -16.3 %. The global longitudinal strain is abnormal. The left ventricular internal cavity size was normal in size. There is mild left ventricular hypertrophy of the  basal-septal segment. Left ventricular diastolic parameters are indeterminate. Right Ventricle: The right ventricular size is normal. Right vetricular wall thickness was not well visualized. Right ventricular systolic function is normal. There is normal pulmonary artery systolic pressure. The tricuspid regurgitant velocity is 1.78 m/s, and with an assumed right atrial pressure of 8 mmHg, the estimated right ventricular systolic pressure is 20.7 mmHg. Left Atrium: Left atrial size was moderately dilated. Right Atrium: Right atrial size was normal in size. Pericardium: There is no evidence of pericardial effusion. Mitral Valve: The mitral valve is normal in structure. No evidence of mitral valve regurgitation. No evidence of mitral valve stenosis. Tricuspid Valve: The tricuspid valve is normal in structure. Tricuspid valve regurgitation is trivial. No evidence of tricuspid stenosis. Aortic Valve: The aortic valve is tricuspid. Aortic valve regurgitation is not visualized. Aortic regurgitation PHT measures 935 msec. Aortic valve sclerosis is present, with no evidence of aortic valve stenosis. Pulmonic Valve: The pulmonic valve was not well visualized. Pulmonic valve regurgitation is mild. No evidence of pulmonic stenosis. Aorta: The aortic root and ascending aorta are structurally normal, with no evidence of dilitation. Venous: The inferior vena cava is dilated in size with greater than 50% respiratory variability, suggesting right atrial pressure of 8 mmHg. IAS/Shunts: The interatrial septum was not well visualized.  LEFT VENTRICLE PLAX 2D LVIDd:         5.51 cm         Diastology LVIDs:         2.95 cm         LV e' medial:    7.29 cm/s LV PW:         1.00 cm         LV E/e' medial:  8.8 LV IVS:        1.30 cm         LV e' lateral:   8.49 cm/s LVOT diam:     2.00 cm         LV E/e' lateral: 7.5 LV SV:         80 LV SV  Index:   40              2D LVOT Area:     3.14 cm        Longitudinal                                 Strain                                2D Strain GLS  -16.3 %                                Avg:                                 3D Volume EF                                LV 3D EF:    Left                                             ventricul                                             ar                                             ejection                                             fraction                                             by 3D                                             volume is                                             61 %.                                 3D Volume EF:                                3D EF:  61 %                                LV EDV:       128 ml                                LV ESV:       50 ml                                LV SV:        78 ml RIGHT VENTRICLE RV Basal diam:  4.52 cm RV Mid diam:    3.66 cm RV S prime:     16.00 cm/s TAPSE (M-mode): 2.6 cm LEFT ATRIUM             Index        RIGHT ATRIUM           Index LA diam:        5.10 cm 2.52 cm/m   RA Area:     15.70 cm LA Vol (A2C):   79.2 ml 39.16 ml/m  RA Volume:   41.30 ml  20.42 ml/m LA Vol (A4C):   99.0 ml 48.95 ml/m LA Biplane Vol: 96.7 ml 47.82 ml/m  AORTIC VALVE LVOT Vmax:   102.00 cm/s LVOT Vmean:  63.900 cm/s LVOT VTI:    0.256 m AI PHT:      935 msec  AORTA Ao Root diam: 3.70 cm MITRAL VALVE               TRICUSPID VALVE MV Area (PHT): 3.03 cm    TR Peak grad:   12.7 mmHg MV Decel Time: 250 msec    TR Vmax:        178.00 cm/s MV E velocity: 63.90 cm/s MV A velocity: 66.00 cm/s  SHUNTS MV E/A ratio:  0.97        Systemic VTI:  0.26 m                            Systemic Diam: 2.00 cm Sunit Tolia Electronically signed by Tessa Lerner Signature Date/Time: 02/23/2023/4:34:58 PM    Final    CT CARDIAC SCORING (SELF PAY ONLY) Result Date: 02/23/2023 CLINICAL DATA:  Risk stratification: 72 Year-old Male EXAM: Coronary Calcium Score TECHNIQUE: The patient was scanned on a [employer] scanner.  Axial non-contrast 3 mm slices were carried out through the heart. The data set was analyzed on a dedicated work station and scored using the Agatson method. FINDINGS: Non-cardiac: See separate report from [facility]. Ascending Aorta: Normal caliber. Aortic atherosclerosis. Aortic Valve Calcium score: 365 Mitral annular calcification: Mild Pericardium: Normal. Coronary arteries: Normal origins. Coronary Calcium Score: Left main: 41 Left anterior descending artery: 257 Left circumflex artery: 271 Right coronary artery: Total: 569 Percentile: 74th for age, sex, and race matched control. IMPRESSION: 1. Coronary calcium score of 569. This was 74th percentile for age, gender, and race matched controls. 2. Aortic atherosclerosis. RECOMMENDATIONS: The proposed cut-off value of 1,651 AU yielded a 93 % sensitivity and 75 % specificity in grading AS severity in patients with classical low-flow, low-gradient AS. Proposed different cut-off values to define severe AS for men and women as 2,065 AU and 1,274 AU, respectively.  The joint European and American recommendations for the assessment of AS consider the aortic valve calcium score as a continuum - a very high calcium score suggests severe AS and a low calcium score suggests severe AS is unlikely. Sunday Shams, et al. 2017 ESC/EACTS Guidelines for the management of valvular heart disease. Eur Heart J 2017;38:2739-91. Coronary artery calcium (CAC) score is a strong predictor of incident coronary heart disease (CHD) and provides predictive information beyond traditional risk factors. CAC scoring is reasonable to use in the decision to withhold, postpone, or initiate statin therapy in intermediate-risk or selected borderline-risk asymptomatic adults (age 28-75 years and LDL-C >=70 to <190 mg/dL) who do not have diabetes or established atherosclerotic cardiovascular disease (ASCVD).* In intermediate-risk (10-year ASCVD risk >=7.5% to <20%) adults or  selected borderline-risk (10-year ASCVD risk >=5% to <7.5%) adults in whom a CAC score is measured for the purpose of making a treatment decision the following recommendations have been made: If CAC = 0, it is reasonable to withhold statin therapy and reassess in 5 to 10 years, as long as higher risk conditions are absent (diabetes mellitus, family history of premature CHD in first degree relatives (males <55 years; females <65 years), cigarette smoking, LDL >=190 mg/dL or other independent risk factors). If CAC is 1 to 99, it is reasonable to initiate statin therapy for patients >=4 years of age. If CAC is >=100 or >=75th percentile, it is reasonable to initiate statin therapy at any age. Cardiology referral should be considered for patients with CAC scores =400 or >=75th percentile. *2018 AHA/ACC/AACVPR/AAPA/ABC/ACPM/ADA/AGS/APhA/ASPC/NLA/PCNA Guideline on the Management of Blood Cholesterol: A Report of the American College of Cardiology/American Heart Association Task Force on Clinical Practice Guidelines. J Am Coll Cardiol. 2019;73(24):3168-3209. Riley Lam, MD Electronically Signed   By: Riley Lam M.D.   On: 02/23/2023 11:04    ECHOCARDIOGRAM COMPLETE Result Date: 02/23/2023    ECHOCARDIOGRAM REPORT   Patient Name:   Jay Fox Date of Exam: 02/23/2023 Medical Rec #:  [id number]      Height:       69.5 in Accession #:    [id number]     Weight:       188.0 lb Date of Birth:  1950/07/27      BSA:          2.022 m Patient Age:    72 years       BP:           121/76 mmHg Patient Gender: M              HR:           55 bpm. Exam Location:  Outpatient Procedure: 2D Echo, 3D Echo, Color Doppler, Cardiac Doppler and Strain Analysis Indications:    R01.1 Murmur  History:        Patient has no prior history of Echocardiogram examinations.                 Signs/Symptoms:Murmur; Risk Factors:Hypertension, Non-Smoker and                 Dyslipidemia. Prostate Cancer.  Sonographer:    Jeryl Columbia RDCS Referring Phys: [phone] THOMAS A KELLY IMPRESSIONS  1. Left ventricular ejection fraction, by estimation, is 60 to 65%. Left ventricular ejection fraction by 3D volume is 61 %. The left ventricle has normal function. The left ventricle has no regional wall motion abnormalities. There is mild left ventricular hypertrophy of the basal-septal  segment. Left ventricular diastolic parameters are indeterminate. The average left ventricular global longitudinal strain is -16.3 %. The global longitudinal strain is abnormal.  2. Right ventricular systolic function is normal. The right ventricular size is normal. There is normal pulmonary artery systolic pressure.  3. Left atrial size was moderately dilated.  4. The mitral valve is normal in structure. No evidence of mitral valve regurgitation. No evidence of mitral stenosis.  5. The aortic valve is tricuspid. Aortic valve regurgitation is not visualized. Aortic valve sclerosis is present, with no evidence of aortic valve stenosis.  6. The inferior vena cava is dilated in size with >50% respiratory variability, suggesting right atrial pressure of 8 mmHg. Comparison(s): No prior Echocardiogram. FINDINGS  Left Ventricle: Left ventricular ejection fraction, by estimation, is 60 to 65%. Left ventricular ejection fraction by 3D volume is 61 %. The left ventricle has normal function. The left ventricle has no regional wall motion abnormalities. The average left ventricular global longitudinal strain is -16.3 %. The global longitudinal strain is abnormal. The left ventricular internal cavity size was normal in size. There is mild left ventricular hypertrophy of the basal-septal segment. Left ventricular diastolic parameters are indeterminate. Right Ventricle: The right ventricular size is normal. Right vetricular wall thickness was not well visualized. Right ventricular systolic function is normal. There is normal pulmonary artery systolic pressure. The tricuspid regurgitant  velocity is 1.78 m/s, and with an assumed right atrial pressure of 8 mmHg, the estimated right ventricular systolic pressure is 20.7 mmHg. Left Atrium: Left atrial size was moderately dilated. Right Atrium: Right atrial size was normal in size. Pericardium: There is no evidence of pericardial effusion. Mitral Valve: The mitral valve is normal in structure. No evidence of mitral valve regurgitation. No evidence of mitral valve stenosis. Tricuspid Valve: The tricuspid valve is normal in structure. Tricuspid valve regurgitation is trivial. No evidence of tricuspid stenosis. Aortic Valve: The aortic valve is tricuspid. Aortic valve regurgitation is not visualized. Aortic regurgitation PHT measures 935 msec. Aortic valve sclerosis is present, with no evidence of aortic valve stenosis. Pulmonic Valve: The pulmonic valve was not well visualized. Pulmonic valve regurgitation is mild. No evidence of pulmonic stenosis. Aorta: The aortic root and ascending aorta are structurally normal, with no evidence of dilitation. Venous: The inferior vena cava is dilated in size with greater than 50% respiratory variability, suggesting right atrial pressure of 8 mmHg. IAS/Shunts: The interatrial septum was not well visualized.  LEFT VENTRICLE PLAX 2D LVIDd:         5.51 cm         Diastology LVIDs:         2.95 cm         LV e' medial:    7.29 cm/s LV PW:         1.00 cm         LV E/e' medial:  8.8 LV IVS:        1.30 cm         LV e' lateral:   8.49 cm/s LVOT diam:     2.00 cm         LV E/e' lateral: 7.5 LV SV:         80 LV SV Index:   40              2D LVOT Area:     3.14 cm        Longitudinal  Strain                                2D Strain GLS  -16.3 %                                Avg:                                 3D Volume EF                                LV 3D EF:    Left                                             ventricul                                             ar                                              ejection                                             fraction                                             by 3D                                             volume is                                             61 %.                                 3D Volume EF:                                3D EF:        61 %                                LV EDV:       128 ml  LV ESV:       50 ml                                LV SV:        78 ml RIGHT VENTRICLE RV Basal diam:  4.52 cm RV Mid diam:    3.66 cm RV S prime:     16.00 cm/s TAPSE (M-mode): 2.6 cm LEFT ATRIUM             Index        RIGHT ATRIUM           Index LA diam:        5.10 cm 2.52 cm/m   RA Area:     15.70 cm LA Vol (A2C):   79.2 ml 39.16 ml/m  RA Volume:   41.30 ml  20.42 ml/m LA Vol (A4C):   99.0 ml 48.95 ml/m LA Biplane Vol: 96.7 ml 47.82 ml/m  AORTIC VALVE LVOT Vmax:   102.00 cm/s LVOT Vmean:  63.900 cm/s LVOT VTI:    0.256 m AI PHT:      935 msec  AORTA Ao Root diam: 3.70 cm MITRAL VALVE               TRICUSPID VALVE MV Area (PHT): 3.03 cm    TR Peak grad:   12.7 mmHg MV Decel Time: 250 msec    TR Vmax:        178.00 cm/s MV E velocity: 63.90 cm/s MV A velocity: 66.00 cm/s  SHUNTS MV E/A ratio:  0.97        Systemic VTI:  0.26 m                            Systemic Diam: 2.00 cm Sunit Tolia Electronically signed by Tessa Lerner Signature Date/Time: 02/23/2023/4:34:58 PM    Final    CT CARDIAC SCORING (SELF PAY ONLY) Result Date: 02/23/2023 CLINICAL DATA:  Risk stratification: 72 Year-old Male EXAM: Coronary Calcium Score TECHNIQUE: The patient was scanned on a [employer] scanner. Axial non-contrast 3 mm slices were carried out through the heart. The data set was analyzed on a dedicated work station and scored using the Agatson method. FINDINGS: Non-cardiac: See separate report from [facility]. Ascending Aorta: Normal caliber. Aortic atherosclerosis. Aortic Valve Calcium score: 365  Mitral annular calcification: Mild Pericardium: Normal. Coronary arteries: Normal origins. Coronary Calcium Score: Left main: 41 Left anterior descending artery: 257 Left circumflex artery: 271 Right coronary artery: Total: 569 Percentile: 74th for age, sex, and race matched control. IMPRESSION: 1. Coronary calcium score of 569. This was 74th percentile for age, gender, and race matched controls. 2. Aortic atherosclerosis. RECOMMENDATIONS: The proposed cut-off value of 1,651 AU yielded a 93 % sensitivity and 75 % specificity in grading AS severity in patients with classical low-flow, low-gradient AS. Proposed different cut-off values to define severe AS for men and women as 2,065 AU and 1,274 AU, respectively. The joint European and American recommendations for the assessment of AS consider the aortic valve calcium score as a continuum - a very high calcium score suggests severe AS and a low calcium score suggests severe AS is unlikely. Sunday Shams, et al. 2017 ESC/EACTS Guidelines for the management of valvular heart disease. Eur Heart J 2017;38:2739-91. Coronary artery calcium (CAC) score is a strong predictor of incident coronary heart  disease (CHD) and provides predictive information beyond traditional risk factors. CAC scoring is reasonable to use in the decision to withhold, postpone, or initiate statin therapy in intermediate-risk or selected borderline-risk asymptomatic adults (age 65-75 years and LDL-C >=70 to <190 mg/dL) who do not have diabetes or established atherosclerotic cardiovascular disease (ASCVD).* In intermediate-risk (10-year ASCVD risk >=7.5% to <20%) adults or selected borderline-risk (10-year ASCVD risk >=5% to <7.5%) adults in whom a CAC score is measured for the purpose of making a treatment decision the following recommendations have been made: If CAC = 0, it is reasonable to withhold statin therapy and reassess in 5 to 10 years, as long as higher risk conditions are  absent (diabetes mellitus, family history of premature CHD in first degree relatives (males <55 years; females <65 years), cigarette smoking, LDL >=190 mg/dL or other independent risk factors). If CAC is 1 to 99, it is reasonable to initiate statin therapy for patients >=3 years of age. If CAC is >=100 or >=75th percentile, it is reasonable to initiate statin therapy at any age. Cardiology referral should be considered for patients with CAC scores =400 or >=75th percentile. *2018 AHA/ACC/AACVPR/AAPA/ABC/ACPM/ADA/AGS/APhA/ASPC/NLA/PCNA Guideline on the Management of Blood Cholesterol: A Report of the American College of Cardiology/American Heart Association Task Force on Clinical Practice Guidelines. J Am Coll Cardiol. 2019;73(24):3168-3209. Riley Lam, MD Electronically Signed   By: Riley Lam M.D.   On: 02/23/2023 11:04         Additional Info: This encounter employed real-time, collaborative documentation. The patient actively reviewed and updated their medical record on a shared screen, ensuring transparency and facilitating joint problem-solving for the problem list, overview, and plan. This approach promotes accurate, informed care. The treatment plan was discussed and reviewed in detail, including medication safety, potential side effects, and all patient questions. We confirmed understanding and comfort with the plan. Follow-up instructions were established, including contacting the office for any concerns, returning if symptoms worsen, persist, or new symptoms develop, and precautions for potential emergency department visits.

## 2023-03-04 ENCOUNTER — Ambulatory Visit: Payer: Medicare Other | Admitting: Neurology

## 2023-03-10 ENCOUNTER — Ambulatory Visit: Payer: Medicare Other | Admitting: Neurology

## 2023-03-18 ENCOUNTER — Other Ambulatory Visit: Payer: Self-pay | Admitting: Internal Medicine

## 2023-03-18 DIAGNOSIS — I10 Essential (primary) hypertension: Secondary | ICD-10-CM

## 2023-03-22 DIAGNOSIS — M9903 Segmental and somatic dysfunction of lumbar region: Secondary | ICD-10-CM | POA: Diagnosis not present

## 2023-03-22 DIAGNOSIS — M51361 Other intervertebral disc degeneration, lumbar region with lower extremity pain only: Secondary | ICD-10-CM | POA: Diagnosis not present

## 2023-03-26 ENCOUNTER — Other Ambulatory Visit: Payer: Self-pay | Admitting: Internal Medicine

## 2023-03-26 DIAGNOSIS — I10 Essential (primary) hypertension: Secondary | ICD-10-CM

## 2023-04-04 DIAGNOSIS — N393 Stress incontinence (female) (male): Secondary | ICD-10-CM | POA: Diagnosis not present

## 2023-04-04 DIAGNOSIS — C61 Malignant neoplasm of prostate: Secondary | ICD-10-CM | POA: Diagnosis not present

## 2023-04-18 DIAGNOSIS — C642 Malignant neoplasm of left kidney, except renal pelvis: Secondary | ICD-10-CM | POA: Diagnosis not present

## 2023-04-18 DIAGNOSIS — K5730 Diverticulosis of large intestine without perforation or abscess without bleeding: Secondary | ICD-10-CM | POA: Diagnosis not present

## 2023-04-18 DIAGNOSIS — I700 Atherosclerosis of aorta: Secondary | ICD-10-CM | POA: Diagnosis not present

## 2023-04-18 DIAGNOSIS — Z905 Acquired absence of kidney: Secondary | ICD-10-CM | POA: Diagnosis not present

## 2023-04-18 NOTE — Progress Notes (Signed)
NEUROLOGY FOLLOW UP OFFICE NOTE  Jay Fox [id number]  Subjective:  Jay Fox is a 73 y.o. year old right-handed male with a medical history of HTN, prostate cancer, renal cell carcinoma, CKD, restless leg syndrome, chronic low back pain, OSA (on BiPAP), prediabetes, depression, anxiety who we last saw on 02/03/23 for RLS and headaches.  To briefly review: Initial consult 12/02/21: Patient has had RLS for 10-12 years. He describes it as a creepy crawly feeling in both feet (on bottom). He feels the urge to move his feet. If he gets up and walks, the sensation goes away. Poor sleep makes his symptoms worse. He takes ropinirole 5 mg at 9:30 pm and if his symptoms return or does not work, he takes another 5 mg (usually will be 11pm to 12am). Most nights he now takes both 5 mg doses (10 mg total). He thinks he has been on the ropinirole for about 2 years. He was on gabapentin and an under the tongue medication then. Gabapentin did not help. He does not remember the dose. He was started on 2.5 mg at that time.   Patient was seen by his PCP, Dr. Jon Billings on 11/30/21 for his symptoms. Per the clinic note, patient has a 10-12 year history of RLS. His symptoms have been progressively worsening despite increasing doses of ropinirole. He was taking 10 mg daily at that clinic visit. It was recommended the patient wean off ropinirole at some point. Labs and MRI lumbar spine was ordered. He was prescribed on pramipexole 0.5 mg TID and rotigotine patch (3 mg) daily. He has not gotten either yet. One was covered by insurance and one was not (he does not remember which today). He was also told to stop citalopram. Patient also mentioned a vein issue that has popped up over the last 6 months. He will be seeing a vascular specialist for this. Patient was referred to neurology for further work up and management of RLS.   Do RLS symptoms appear earlier than when ropinirole was first started? Sometimes yes, but  not all the time. Over the last 6 months or so, he now gets the symptoms when taking a nap or sitting for long periods. Are higher doses now needed or do you need to take the medication earlier to control symptoms? Yes Has intensity of symptoms worsened since starting ropinirole? No change in intensity, but happening more. Have symptoms spread to other body parts since starting ropinirole? No   He denies numbness and tingling when not having RLS symptoms (during the day). He does have a history of plantar fasciitis (bilateral) and Morton's neuroma on right foot (early 2000s).   He describes poor sleep due to sleep apnea but also insomnia. He previously saw sleep medicine in [place], but has no one here. He is interested in establishing care.   Patient also mentions achy teeth on the left side of mouth since 2016. He had a burning mouth for a while. A neurologist in [place] put his on gabapentin, which did not help. This resolved, but he recently had caps put on and the tooth pain has returned. The burning mouth has not returned.   03/03/22: Patient messaged that his RLS was worsening on 01/11/22. I increased his Lyrica to 100 mg and asked that he reduce ropinirole to 5 mg (was taking 10 mg). I further reduced this to 2.5 mg (1/2 tablet on 01/26/22). Patient reported improvement on 02/05/22.   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   Since patient increased Lyrica to 100 mg, his symptoms have resolved. He continues to take magnesium. He no longer takes ropinirole.    09/03/22: Patient's IFE again showed a faint IgG lambda monoclonal antibody. I referred him to hematology. There was no indication of abnormal M protein on further testing and no indication of plasma cell dyscrasia.   Patient has noticed increase in symptoms in legs needing to move over the last 2 months. The  last couple have weeks have been the worse. Patient is prescribed Lyrica 100 mg at bedtime. He takes 100 mg around 9:30 pm. He may take 1/2 capsule or full capsule later in the night. Taking more Lyrica will help with the symptoms.   He continues on BiPAP for OSA.   He has no other complaints.  Lyrica was increased to 150 mg at bedtime on 09/03/22.  02/03/23: Patient had cataract surgery (left eye on 10/29/22, right eye 11/26/22). After the left eye surgery, he had a lot of blurriness of vision, particularly on the left peripheral vision. He then got the right side done. After the right sided surgery, he noticed light sensitivity. He was on steroid drops and when these were stopped, the headaches began. He describes the symptoms as starting anxiety attacks (anxiety in chest), then tinnitus in ears and pounding on sides of head, then eye and forehead head pain (pounding, pressure, throbbing). It is worse with exposure to artifical lights (computer, TV, lights in buildings) but not sunlight as much. He rates the pain as 7-8/10. It usually happens every day, but does not occur unless he is exposed to artificial light. The headache will last 1-2 hours. Closing his eyes, being in a dark, cold room, and laying down with a cold compress with help. He tried tylenol and ibuprofen but neither seemed to help. Patient was prescribed Nurtec 75 mg PRN for rescue. He has taken it twice. When he took it at headache onset, it worked quickly.   Prior to surgery, patient only had occasional sinus headaches.   He denies jaw claudication or fevers.   He was seen by Earley Brooke who did the surgery (last on 01/10/23). There were no significant ophthalmic abnormalities seen to explain symptoms per patient. Patient requested second opinion. He has not heard from anyone about this.   He has been wearing yellow tinted glasses to help block some of the light.   He mentions that while living in [place], he would occasionally  have tunnel vision and weird head sensation episodes, usually at higher altitudes. This has not happened recently.   In terms of RLS that I saw patient for previously, patient is doing well. He is happy with Lyrica since it was increased.  Most recent Assessment and Plan (02/03/23): This is Jay Fox, a 73 y.o. male with: Restless leg syndrome - symptoms currently well controlled on Lyrica 150 mg. He had a normal ferritin (12/01/21). 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  Photosensitive headache - since cataract surgery, only to artificial light and not sunlight. There is likely an anxiety component as this is how headaches start. Patient is concerned about nerve damage to the eye that may have occurred during surgery. I explained that any small nerves like this I would not be able to prove or disprove. His CN testing appears normal to me today, so there  is no obvious nerve injury. Perhaps the cataract removal has caused photosensitivity and new onset headaches as a result. Hopefully with treating headaches, we can greatly reduce symptoms. GCA is another potential mimic, so I will get inflammatory markers to screen for this today.   Plan: -Blood work: ESR, CRP -For headaches: Headache prevention:  Venlafaxine 37.5 mg for 1 week, then increase to 75 mg daily thereafter. Will stop citalopram. Headache rescue:  Nurtec 75 mg as needed at headache onset Limit use of pain relievers to no more than 2 days out of week to prevent risk of rebound or medication-overuse headache. Keep headache diary  Continue Lyrica 150 mg bedtime for RLS   Since their last visit: ESR and CRP were normal.   Regarding his RLS, he usually takes Lyrica 150 mg, but will rarely will add a 50 mg tablet and take 200 mg. His symptoms are stable. He does take iron supplementation.  Patient's headaches are much better. He only gets it with lights, but not sun light. He took Nurtec only 1 time but  none since last visit. He thinks going up on the Venlafaxine to 75 mg has helped.  He has no new problems.   MEDICATIONS:  Outpatient Encounter Medications as of 04/28/2023  Medication Sig   albuterol (PROVENTIL) (2.5 MG/3ML) 0.083% nebulizer solution Take 3 mLs (2.5 mg total) by nebulization every 6 (six) hours as needed for wheezing or shortness of breath.   albuterol (VENTOLIN HFA) 108 (90 Base) MCG/ACT inhaler Inhale 2 puffs into the lungs every 6 (six) hours as needed for wheezing or shortness of breath.   amLODipine (NORVASC) 10 MG tablet Take 1 tablet by mouth daily   Ascorbic Acid (VITAMIN C PO) Take 1 tablet by mouth once. 500mg    budesonide-formoterol (BREYNA) 160-4.5 MCG/ACT inhaler Inhale 2 puffs into the lungs 2 (two) times daily.   calcium carbonate (SUPER CALCIUM) 1500 (600 Ca) MG TABS tablet once.   Calcium Carbonate-Vit D-Min (CALCIUM 1200 PO) Take 1 tablet by mouth daily.   Cholecalciferol (VITAMIN D3) 50 MCG (2000 UT) TABS Take 1 tablet by mouth daily.   diclofenac Sodium (VOLTAREN) 1 % GEL Place onto the skin.   fluticasone (FLONASE) 50 MCG/ACT nasal spray Place 2 sprays into both nostrils daily.   irbesartan (AVAPRO) 300 MG tablet Take 1 tablet by mouth once daily   loratadine (CLARITIN) 10 MG tablet Take 1 tablet (10 mg total) by mouth daily.   Multiple Vitamin (MULTIVITAMIN) tablet Take 1 tablet by mouth daily.   Omega-3 Fatty Acids (FISH OIL) 1200 MG CAPS Take 1,200 mg by mouth in the morning and at bedtime.   pseudoephedrine (SUDAFED 12 HOUR) 120 MG 12 hr tablet Take 1 tablet (120 mg total) by mouth 2 (two) times daily. (Patient taking differently: Take 120 mg by mouth every 12 (twelve) hours as needed.)   Respiratory Therapy Supplies MISC Filters Hoses tubing masks headgear cleaning supplies for dreamwear and  philips respironics bipap machine.   Rimegepant Sulfate (NURTEC) 75 MG TBDP Take 1 tablet (75 mg total) by mouth daily as needed.   Saline (SIMPLY SALINE) 0.9  % AERS Place 2 each into the nose as directed. Use nightly for sinus hygiene long-term.  Can also be used as many times daily as desired to assist with clearing congested sinuses.   sildenafil (REVATIO) 20 MG tablet 1 tablet Orally Once a day   Spacer/Aero-Holding Chambers (AEROCHAMBER MV) inhaler Use as directed with albuterol inhaler.   TURMERIC PO  Take 1,000 mg by mouth in the morning and at bedtime.   vitamin B-12 (CYANOCOBALAMIN) 500 MCG tablet Take 1 tablet by mouth daily.   zolpidem (AMBIEN) 10 MG tablet Take 1 tablet (10 mg total) by mouth at bedtime as needed for sleep. Can NOT take with opioid pain medicine, alcohol, or other sedative.  Risk dementia/mornings impaired alertness   [DISCONTINUED] pregabalin (LYRICA) 150 MG capsule Take 1 capsule (150 mg total) by mouth at bedtime.   [DISCONTINUED] venlafaxine XR (EFFEXOR XR) 37.5 MG 24 hr capsule Take 2 capsules (75 mg total) by mouth daily with breakfast. Take 1 capsule (37.5 mg) by mouth for 1 week, then increase to 2 capsules (75 mg) thereafter   amoxicillin-clavulanate (AUGMENTIN) 875-125 MG tablet Take 1 tablet by mouth 2 (two) times daily. (Patient not taking: Reported on 04/28/2023)   calcium carbonate (TUMS - DOSED IN MG ELEMENTAL CALCIUM) 500 MG chewable tablet Chew 1 tablet by mouth as needed. (Patient not taking: Reported on 04/28/2023)   Cholecalciferol (VITAMIN D PO) Take 1 tablet by mouth daily. (Patient not taking: Reported on 04/28/2023)   ketorolac (ACULAR) 0.4 % SOLN Place 1 drop into both eyes 4 (four) times daily. (Patient not taking: Reported on 04/28/2023)   pregabalin (LYRICA) 150 MG capsule Take 1 capsule (150 mg total) by mouth at bedtime.   venlafaxine XR (EFFEXOR XR) 75 MG 24 hr capsule Take 1 capsule (75 mg total) by mouth daily with breakfast.   No facility-administered encounter medications on file as of 04/28/2023.    PAST MEDICAL HISTORY: Past Medical History:  Diagnosis Date   Allergy 1953   Altered olfactory  perception 05/31/2022   Smells cigarettes sometimes     Anxiety    Arthritis    Asthma    BPH (benign prostatic hypertrophy)    BPH (benign prostatic hypertrophy)    Cancer (HCC)    Cataract    Chronic kidney disease    Depression    GERD (gastroesophageal reflux disease)    History of kidney cancer    History of prostate cancer 11/30/2021   Hyperlipidemia 12/08/2022   Hypertension    Leg swelling 11/30/2021   Noticed early 2023 with ferritin deposition.    OSA (obstructive sleep apnea) 11/30/2021   Restless legs 11/30/2021   Seasonal allergies    Sleep apnea     PAST SURGICAL HISTORY: Past Surgical History:  Procedure Laterality Date   CATARACT EXTRACTION, BILATERAL Bilateral 11/2022   10/24 left   NASAL SINUS SURGERY  2019   RENAL CRYOABLATION     TONSILLECTOMY      ALLERGIES: Allergies  Allergen Reactions   Shellfish Allergy Rash, Shortness Of Breath and Swelling   Shellfish-Derived Products Shortness Of Breath    FAMILY HISTORY: Family History  Problem Relation Age of Onset   Emphysema Mother    Liver cancer Mother    Pancreatic cancer Mother    Anxiety disorder Mother    Cancer Mother    Hypertension Mother    Multiple myeloma Father    Cancer Father    Hypertension Father    Cancer Paternal Grandmother    Colon cancer Paternal Grandmother    Cancer Paternal Grandfather    Colon cancer Paternal Grandfather     SOCIAL HISTORY: Social History   Tobacco Use   Smoking status: Never   Smokeless tobacco: Never  Vaping Use   Vaping status: Never Used  Substance Use Topics   Alcohol use: Yes    Alcohol/week:  10.0 standard drinks of alcohol    Types: 10 Standard drinks or equivalent per week    Comment: Varies sometimes not at all   Drug use: No   Social History   Social History Narrative   Right handed   Caffeine none   Lives in one story home   Retired    Lives with wife      Objective:  Vital Signs:  BP 136/65   Pulse 64   Ht 5'  9.5" (1.765 m)   Wt 187 lb (84.8 kg)   SpO2 95%   BMI 27.22 kg/m   General: No acute distress.  Patient appears well-groomed.   Head:  Normocephalic/atraumatic Neck: supple Lungs:  Non-labored breathing on room air  Neurological Exam: alert and oriented.  Speech fluent and not dysarthric, language intact.  CN II-XII intact. Bulk and tone normal, muscle strength 5/5 throughout.  Sensation to light touch intact.  Deep tendon reflexes 2+ throughout.  Finger to nose testing intact.  Gait normal.  Labs and Imaging review: New results: 02/03/23: ESR wnl CRP wnl  Echocardiogram (02/23/23):  1. Left ventricular ejection fraction, by estimation, is 60 to 65%. Left  ventricular ejection fraction by 3D volume is 61 %. The left ventricle has  normal function. The left ventricle has no regional wall motion  abnormalities. There is mild left  ventricular hypertrophy of the basal-septal segment. Left ventricular  diastolic parameters are indeterminate. The average left ventricular  global longitudinal strain is -16.3 %. The global longitudinal strain is  abnormal.   2. Right ventricular systolic function is normal. The right ventricular  size is normal. There is normal pulmonary artery systolic pressure.   3. Left atrial size was moderately dilated.   4. The mitral valve is normal in structure. No evidence of mitral valve  regurgitation. No evidence of mitral stenosis.   5. The aortic valve is tricuspid. Aortic valve regurgitation is not  visualized. Aortic valve sclerosis is present, with no evidence of aortic  valve stenosis.   6. The inferior vena cava is dilated in size with >50% respiratory  variability, suggesting right atrial pressure of 8 mmHg.   Previously reviewed results: 12/08/22: HbA1c: 5.9 TSH wnl Lipid panel: tChol 134, LDL 38, TG 227 CMP unremarkable CBC unremarkable   IFE (03/03/22): faint IgG lambda monoclonal immunoglobulin detected   MM panel (04/05/22): No M  protein seen K/L (04/05/22): wnl CBC and CMP (04/05/22): unremarkable   12/02/21: B12: 470 IFE: IgG lambda monoclonal ab present HbA1c: 6.0    12/01/21: Normal or unremarkable: CBC, CMP, Ferritin 134.1   EMG (01/25/22): NCV & EMG Findings: Extensive electrodiagnostic evaluation of the left lower limb shows: Left sural and superficial peroneal sensory studies are within normal limits. Left peroneal (EDB) and tibial (AH) motor studies are within normal limits. Left H reflex response shows a mildly prolonged latency. Chronic motor axon loss changes without accompanying active denervation changes are seen in the left tibialis anterior, flexor digitorum longus, medial head of gastrocnemius, and short head of bicep femoris muscles.   Impression: This is an abnormal electrodiagnostic evaluation. 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 No electrodiagnostic evidence of a generalized polyneuropathy or myopathy.  Assessment/Plan:  This is Jay Fox, a 73 y.o. male with: Restless leg syndrome - symptoms currently well controlled on Lyrica 150 mg. He had a normal  ferritin (12/01/21). 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  Photosensitive headache - since cataract surgery. Improved since prior on Venlafaxine 75 mg daily.  Plan: -For headaches: Headache prevention:  Venlafaxine 75 mg daily thereafter. Headache rescue:  Nurtec 75 mg as needed at headache onset Limit use of pain relievers to no more than 2 days out of week to prevent risk of rebound or medication-overuse headache. Keep headache diary   -Continue Lyrica 150 mg bedtime for RLS -Will check iron studies today  Return to clinic as needed  Total time spent reviewing records, interview, history/exam, documentation, and coordination of care on day of encounter:   30 min  Jacquelyne Balint, MD

## 2023-04-19 DIAGNOSIS — M9903 Segmental and somatic dysfunction of lumbar region: Secondary | ICD-10-CM | POA: Diagnosis not present

## 2023-04-19 DIAGNOSIS — M51361 Other intervertebral disc degeneration, lumbar region with lower extremity pain only: Secondary | ICD-10-CM | POA: Diagnosis not present

## 2023-04-28 ENCOUNTER — Encounter: Payer: Self-pay | Admitting: Neurology

## 2023-04-28 ENCOUNTER — Other Ambulatory Visit: Payer: Medicare Other

## 2023-04-28 ENCOUNTER — Ambulatory Visit: Payer: Medicare Other | Admitting: Neurology

## 2023-04-28 VITALS — BP 136/65 | HR 64 | Ht 69.5 in | Wt 187.0 lb

## 2023-04-28 DIAGNOSIS — G2581 Restless legs syndrome: Secondary | ICD-10-CM | POA: Diagnosis not present

## 2023-04-28 DIAGNOSIS — R519 Headache, unspecified: Secondary | ICD-10-CM

## 2023-04-28 DIAGNOSIS — G4733 Obstructive sleep apnea (adult) (pediatric): Secondary | ICD-10-CM | POA: Diagnosis not present

## 2023-04-28 DIAGNOSIS — R209 Unspecified disturbances of skin sensation: Secondary | ICD-10-CM

## 2023-04-28 DIAGNOSIS — E611 Iron deficiency: Secondary | ICD-10-CM | POA: Diagnosis not present

## 2023-04-28 DIAGNOSIS — G4700 Insomnia, unspecified: Secondary | ICD-10-CM | POA: Diagnosis not present

## 2023-04-28 DIAGNOSIS — I10 Essential (primary) hypertension: Secondary | ICD-10-CM | POA: Diagnosis not present

## 2023-04-28 LAB — IRON,TIBC AND FERRITIN PANEL
%SAT: 34 % (ref 20–48)
Ferritin: 168 ng/mL (ref 24–380)
Iron: 104 ug/dL (ref 50–180)
TIBC: 304 ug/dL (ref 250–425)

## 2023-04-28 MED ORDER — VENLAFAXINE HCL ER 75 MG PO CP24
75.0000 mg | ORAL_CAPSULE | Freq: Every day | ORAL | 3 refills | Status: DC
Start: 2023-04-28 — End: 2023-07-06

## 2023-04-28 MED ORDER — PREGABALIN 150 MG PO CAPS: 150.0000 mg | ORAL_CAPSULE | Freq: Every day | ORAL | 3 refills | Status: DC

## 2023-04-28 NOTE — Patient Instructions (Signed)
Continue venlafaxine 75 mg daily. You can take Nurtec as needed for headaches.  Continue Lyrica 150 mg daily for restless legs.  I will check your iron today and be in touch when I have your results.  Please let me know if you have any questions or concerns.  The physicians and staff at [facility] are committed to providing excellent care. You may receive a survey requesting feedback about your experience at our office. We strive to receive "very good" responses to the survey questions. If you feel that your experience would prevent you from giving the office a "very good " response, please contact our office to try to remedy the situation. We may be reached at [phone]. Thank you for taking the time out of your busy day to complete the survey.  Jacquelyne Balint, MD [facility]

## 2023-05-02 DIAGNOSIS — N393 Stress incontinence (female) (male): Secondary | ICD-10-CM | POA: Diagnosis not present

## 2023-05-02 DIAGNOSIS — C642 Malignant neoplasm of left kidney, except renal pelvis: Secondary | ICD-10-CM | POA: Diagnosis not present

## 2023-05-02 DIAGNOSIS — C61 Malignant neoplasm of prostate: Secondary | ICD-10-CM | POA: Diagnosis not present

## 2023-05-24 ENCOUNTER — Other Ambulatory Visit: Payer: Self-pay | Admitting: Neurology

## 2023-05-24 DIAGNOSIS — M9903 Segmental and somatic dysfunction of lumbar region: Secondary | ICD-10-CM | POA: Diagnosis not present

## 2023-05-24 DIAGNOSIS — M51361 Other intervertebral disc degeneration, lumbar region with lower extremity pain only: Secondary | ICD-10-CM | POA: Diagnosis not present

## 2023-05-24 DIAGNOSIS — G2581 Restless legs syndrome: Secondary | ICD-10-CM

## 2023-05-24 DIAGNOSIS — R209 Unspecified disturbances of skin sensation: Secondary | ICD-10-CM

## 2023-05-24 MED ORDER — PREGABALIN 200 MG PO CAPS: 200.0000 mg | ORAL_CAPSULE | Freq: Every day | ORAL | 5 refills | Status: DC

## 2023-06-09 DIAGNOSIS — L814 Other melanin hyperpigmentation: Secondary | ICD-10-CM | POA: Diagnosis not present

## 2023-06-09 DIAGNOSIS — D2262 Melanocytic nevi of left upper limb, including shoulder: Secondary | ICD-10-CM | POA: Diagnosis not present

## 2023-06-09 DIAGNOSIS — L821 Other seborrheic keratosis: Secondary | ICD-10-CM | POA: Diagnosis not present

## 2023-06-09 DIAGNOSIS — D225 Melanocytic nevi of trunk: Secondary | ICD-10-CM | POA: Diagnosis not present

## 2023-06-15 ENCOUNTER — Other Ambulatory Visit: Payer: Self-pay

## 2023-06-15 ENCOUNTER — Encounter: Payer: Self-pay | Admitting: Internal Medicine

## 2023-06-15 DIAGNOSIS — I10 Essential (primary) hypertension: Secondary | ICD-10-CM

## 2023-06-15 MED ORDER — AMLODIPINE BESYLATE 10 MG PO TABS: 10.0000 mg | ORAL_TABLET | Freq: Every day | ORAL | 0 refills | Status: DC

## 2023-06-21 ENCOUNTER — Other Ambulatory Visit: Payer: Self-pay | Admitting: Internal Medicine

## 2023-06-21 DIAGNOSIS — I10 Essential (primary) hypertension: Secondary | ICD-10-CM

## 2023-06-22 ENCOUNTER — Other Ambulatory Visit: Payer: Self-pay | Admitting: Neurology

## 2023-06-22 ENCOUNTER — Encounter: Payer: Self-pay | Admitting: Neurology

## 2023-06-22 ENCOUNTER — Other Ambulatory Visit: Payer: Self-pay

## 2023-06-22 DIAGNOSIS — M51361 Other intervertebral disc degeneration, lumbar region with lower extremity pain only: Secondary | ICD-10-CM | POA: Diagnosis not present

## 2023-06-22 DIAGNOSIS — R209 Unspecified disturbances of skin sensation: Secondary | ICD-10-CM

## 2023-06-22 DIAGNOSIS — G2581 Restless legs syndrome: Secondary | ICD-10-CM

## 2023-06-22 DIAGNOSIS — M9903 Segmental and somatic dysfunction of lumbar region: Secondary | ICD-10-CM | POA: Diagnosis not present

## 2023-06-22 MED ORDER — PREGABALIN 200 MG PO CAPS
200.0000 mg | ORAL_CAPSULE | Freq: Every day | ORAL | 3 refills | Status: DC
Start: 2023-06-22 — End: 2023-07-08

## 2023-06-22 MED ORDER — PREGABALIN 200 MG PO CAPS: 200.0000 mg | ORAL_CAPSULE | Freq: Every day | ORAL | 1 refills | Status: DC

## 2023-07-06 ENCOUNTER — Ambulatory Visit (INDEPENDENT_AMBULATORY_CARE_PROVIDER_SITE_OTHER): Admitting: Family Medicine

## 2023-07-06 ENCOUNTER — Encounter: Payer: Self-pay | Admitting: Family Medicine

## 2023-07-06 VITALS — BP 119/76 | HR 78 | Temp 98.4°F | Resp 18 | Ht 69.5 in | Wt 188.1 lb

## 2023-07-06 DIAGNOSIS — L568 Other specified acute skin changes due to ultraviolet radiation: Secondary | ICD-10-CM | POA: Diagnosis not present

## 2023-07-06 DIAGNOSIS — M67432 Ganglion, left wrist: Secondary | ICD-10-CM

## 2023-07-06 DIAGNOSIS — G44211 Episodic tension-type headache, intractable: Secondary | ICD-10-CM | POA: Diagnosis not present

## 2023-07-06 NOTE — Patient Instructions (Signed)
 Riboflavin for prevention

## 2023-07-06 NOTE — Progress Notes (Signed)
 Subjective:     Patient ID: Jay Fox, male    DOB: 1951/01/30, 73 y.o.   MRN: [id number]  Chief Complaint  Patient presents with   Headache    Left side of head hurting really bad since yesterday, tooK Nurtec, did not help Has been having issues since cataract surgery   Mass    Has bump on left hand that he wants checked out    HPI-here w/wife Discussed the use of AI scribe software for clinical note transcription with the patient, who gave verbal consent to proceed.  History of Present Illness Jay Fox "Jay Fox" is a 73 year old male who presents with persistent headaches and blurry vision following cataract surgery.  Following cataract surgery in August, he began experiencing blurry vision and migraine headaches. The headaches have worsened recently, with a significant episode occurring yesterday that rendered him unable to function. He describes sensitivity to fluorescent lights and blue light, which exacerbates his symptoms. He has been wearing sunglasses indoors to mitigate the light sensitivity, but this has been insufficient recently.  He has been taking venlafaxine  and Nurtec for his headaches. Initially, Nurtec was effective, but it has recently become less so. He has also been using magnesium supplements. His headaches are not consistently relieved by Tylenol or Benadryl. His symptoms improved slightly after turning off some lights during the visit.  His symptoms began after cataract surgery, with the first eye operated on followed by the second a month later. He reports persistent blurry vision and light sensitivity since the surgeries, which initially improved but have recently worsened. He has seen a neurologist who prescribed venlafaxine , and Nurtec was also prescribed. Despite these treatments, his symptoms have persisted and worsened over the past month.  He reports having a bump on his left hand for the past three to four months. He describes it as hard and  sometimes painful when bent in a certain position. No prior injury to the area and notes that the bump appeared suddenly. He attempted to reduce it by hitting it with a book, but this was ineffective.    There are no preventive care reminders to display for this patient.  Past Medical History:  Diagnosis Date   Allergy 1953   Altered olfactory perception 05/31/2022   Smells cigarettes sometimes     Anxiety    Arthritis    Asthma    BPH (benign prostatic hypertrophy)    BPH (benign prostatic hypertrophy)    Cancer (HCC)    Cataract    Chronic kidney disease    Depression    GERD (gastroesophageal reflux disease)    History of kidney cancer    History of prostate cancer 11/30/2021   Hyperlipidemia 12/08/2022   Hypertension    Leg swelling 11/30/2021   Noticed early 2023 with ferritin deposition.    OSA (obstructive sleep apnea) 11/30/2021   Restless legs 11/30/2021   Seasonal allergies    Sleep apnea     Past Surgical History:  Procedure Laterality Date   CATARACT EXTRACTION, BILATERAL Bilateral 11/2022   10/24 left   NASAL SINUS SURGERY  2019   RENAL CRYOABLATION     TONSILLECTOMY       Current Outpatient Medications:    albuterol  (PROVENTIL ) (2.5 MG/3ML) 0.083% nebulizer solution, Take 3 mLs (2.5 mg total) by nebulization every 6 (six) hours as needed for wheezing or shortness of breath., Disp: 150 mL, Rfl: 1   albuterol  (VENTOLIN  HFA) 108 (90 Base) MCG/ACT inhaler, Inhale 2 puffs  into the lungs every 6 (six) hours as needed for wheezing or shortness of breath., Disp: 18 g, Rfl: 11   amLODipine  (NORVASC ) 10 MG tablet, Take 1 tablet (10 mg total) by mouth daily., Disp: 90 tablet, Rfl: 0   Ascorbic Acid (VITAMIN C PO), Take 1 tablet by mouth once. 500mg , Disp: , Rfl:    budesonide -formoterol  (BREYNA ) 160-4.5 MCG/ACT inhaler, Inhale 2 puffs into the lungs 2 (two) times daily., Disp: , Rfl:    calcium carbonate (SUPER CALCIUM) 1500 (600 Ca) MG TABS tablet, once., Disp: ,  Rfl:    calcium carbonate (TUMS - DOSED IN MG ELEMENTAL CALCIUM) 500 MG chewable tablet, Chew 1 tablet by mouth as needed., Disp: , Rfl:    Calcium Carbonate-Vit D-Min (CALCIUM 1200 PO), Take 1 tablet by mouth daily., Disp: , Rfl:    Cholecalciferol (VITAMIN D PO), Take 1 tablet by mouth daily., Disp: , Rfl:    Cholecalciferol (VITAMIN D3) 50 MCG (2000 UT) TABS, Take 1 tablet by mouth daily., Disp: , Rfl:    diclofenac Sodium (VOLTAREN) 1 % GEL, Place onto the skin., Disp: , Rfl:    fluticasone  (FLONASE ) 50 MCG/ACT nasal spray, Place 2 sprays into both nostrils daily., Disp: 16 g, Rfl: 6   irbesartan  (AVAPRO ) 300 MG tablet, Take 1 tablet by mouth once daily, Disp: 90 tablet, Rfl: 0   loratadine  (CLARITIN ) 10 MG tablet, Take 1 tablet (10 mg total) by mouth daily., Disp: 30 tablet, Rfl: 11   Multiple Vitamin (MULTIVITAMIN) tablet, Take 1 tablet by mouth daily., Disp: , Rfl:    Omega-3 Fatty Acids (FISH OIL) 1200 MG CAPS, Take 1,200 mg by mouth in the morning and at bedtime., Disp: , Rfl:    pregabalin  (LYRICA ) 200 MG capsule, Take 1 capsule (200 mg total) by mouth at bedtime., Disp: 90 capsule, Rfl: 3   pseudoephedrine  (SUDAFED 12 HOUR) 120 MG 12 hr tablet, Take 1 tablet (120 mg total) by mouth 2 (two) times daily. (Patient taking differently: Take 120 mg by mouth every 12 (twelve) hours as needed.), Disp: 20 tablet, Rfl: 0   Respiratory Therapy Supplies MISC, Filters Hoses tubing masks headgear cleaning supplies for dreamwear and  philips respironics bipap machine., Disp: 2 each, Rfl: 0   Rimegepant Sulfate (NURTEC) 75 MG TBDP, Take 1 tablet (75 mg total) by mouth daily as needed., Disp: 9 tablet, Rfl: 2   Saline (SIMPLY SALINE) 0.9 % AERS, Place 2 each into the nose as directed. Use nightly for sinus hygiene long-term.  Can also be used as many times daily as desired to assist with clearing congested sinuses., Disp: 127 mL, Rfl: 11   sildenafil (REVATIO) 20 MG tablet, 1 tablet Orally Once a day,  Disp: , Rfl:    Spacer/Aero-Holding Chambers (AEROCHAMBER MV) inhaler, Use as directed with albuterol  inhaler., Disp: 1 each, Rfl: 0   TURMERIC PO, Take 1,000 mg by mouth in the morning and at bedtime., Disp: , Rfl:    vitamin B-12 (CYANOCOBALAMIN) 500 MCG tablet, Take 1 tablet by mouth daily., Disp: , Rfl:    zolpidem  (AMBIEN ) 10 MG tablet, Take 1 tablet (10 mg total) by mouth at bedtime as needed for sleep. Can NOT take with opioid pain medicine, alcohol, or other sedative.  Risk dementia/mornings impaired alertness, Disp: 90 tablet, Rfl: 1  Allergies  Allergen Reactions   Shellfish Allergy Rash, Shortness Of Breath and Swelling   Shellfish-Derived Products Shortness Of Breath   ROS neg/noncontributory except as noted HPI/below  Objective:     BP 119/76   Pulse 78   Temp 98.4 F (36.9 C) (Temporal)   Resp 18   Ht 5' 9.5" (1.765 m)   Wt 188 lb 2 oz (85.3 kg)   SpO2 94%   BMI 27.38 kg/m  Wt Readings from Last 3 Encounters:  07/06/23 188 lb 2 oz (85.3 kg)  04/28/23 187 lb (84.8 kg)  02/25/23 189 lb 6.4 oz (85.9 kg)    Physical Exam   Gen: WDWN NAD HEENT: NCAT, conjunctiva not injected, sclera nonicteric MSK: no gross abnormalities.  NEURO: A&O x3.  CN II-XII intact.  PSYCH: normal mood. Good eye contact L hand-dorsum, proximal to 1st MCP joint-slightly mobile, nt, approx1cm cyst     Assessment & Plan:  Ganglion cyst of dorsum of left wrist  Photosensitivity  Intractable episodic tension-type headache  Assessment and Plan Assessment & Plan Migraine   Chronic migraines with aura are exacerbated by light sensitivity, especially to fluorescent and blue light. Migraines have worsened since cataract surgery in August, with increased frequency and severity. Current treatment with venlafaxine  and Nurtec is less effective. He experiences significant impairment during episodes, including severe headaches that prevent functioning. Refer to a neurologist for further  evaluation and management. Consider Nurtec every other day for prevention-pt wants to wait for neuro. Consider increasing venlafaxine  dosage. Recommend riboflavin supplementation for headache prevention. Provide a sample of an alternative rescue medication Zavzpret nasal given. Advise wearing sunglasses indoors to manage light sensitivity. Discuss the potential need for an MRI to rule out other causes.  Blurry vision post-cataract surgery   Persistent blurry vision since cataract surgery in August includes light sensitivity and difficulty with TV and computer screens. Previous evaluations confirmed proper lens placement, suggesting neurological adaptation issues. Symptoms have worsened recently. Discuss with a neurologist during the follow-up for migraines.  Ganglion cyst of left hand   A ganglion cyst on the left hand has been present for several months, occasionally causing pain with certain movements. The cyst is firm but not significantly uncomfortable at present. Potential interventions include drainage or surgical removal if symptoms worsen. Advised against self-treatment by hitting the cyst, as it may cause injury or recurrence. Monitor the cyst for changes in size or symptoms. Consider referral to sports medicine or a hand surgeon if symptoms worsen-declines for now.   zavzpret Return if symptoms worsen or fail to improve.  Ellsworth Haas, MD
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
## 2023-07-08 ENCOUNTER — Ambulatory Visit: Admitting: Neurology

## 2023-07-08 ENCOUNTER — Encounter: Payer: Self-pay | Admitting: Neurology

## 2023-07-08 ENCOUNTER — Other Ambulatory Visit: Payer: Self-pay | Admitting: Neurology

## 2023-07-08 VITALS — BP 123/67 | HR 85 | Ht 69.5 in | Wt 188.0 lb

## 2023-07-08 DIAGNOSIS — H53149 Visual discomfort, unspecified: Secondary | ICD-10-CM

## 2023-07-08 DIAGNOSIS — R519 Headache, unspecified: Secondary | ICD-10-CM | POA: Diagnosis not present

## 2023-07-08 DIAGNOSIS — G2581 Restless legs syndrome: Secondary | ICD-10-CM

## 2023-07-08 DIAGNOSIS — R209 Unspecified disturbances of skin sensation: Secondary | ICD-10-CM

## 2023-07-08 MED ORDER — PREGABALIN 200 MG PO CAPS: 200.0000 mg | ORAL_CAPSULE | Freq: Every day | ORAL | 3 refills | Status: DC

## 2023-07-08 MED ORDER — VENLAFAXINE HCL 100 MG PO TABS: 100.0000 mg | ORAL_TABLET | Freq: Every day | ORAL | 3 refills | Status: DC

## 2023-07-08 NOTE — Addendum Note (Signed)
 Addended by: Arturo Bing on: 07/08/2023 11:08 AM   Modules accepted: Orders

## 2023-07-08 NOTE — Patient Instructions (Addendum)
 I will increase your venlafaxine  100 mg daily.  Continue Nurtec, but take it as soon as you feel the headache coming on. If this is not working, let me know and I can change this medication.  Given the headaches worsening, it is safest if we check an MRI of your brain to be sure there is nothing we are missing. Someone will call you to schedule. If you do not hear from anyone within 1-2 weeks, please let us  know.  Continue Lyrica  200 mg daily for restless legs.  The physicians and staff at [facility] are committed to providing excellent care. You may receive a survey requesting feedback about your experience at our office. We strive to receive "very good" responses to the survey questions. If you feel that your experience would prevent you from giving the office a "very good " response, please contact our office to try to remedy the situation. We may be reached at [phone]. Thank you for taking the time out of your busy day to complete the survey.  Rommie Coats, MD [facility]

## 2023-07-11 ENCOUNTER — Telehealth: Payer: Self-pay

## 2023-07-11 NOTE — Telephone Encounter (Signed)
 Pt  MRI Brain was approved April 25/25-May 24, 25.

## 2023-07-13 IMAGING — US US RENAL
1 series · 14 of 25 positions shown · non-contrast
Comparison: CT 11/03/2020 and previous

CLINICAL DATA: Chronic kidney disease, low back pain, post left
renal mass ablation

EXAM:
RENAL / URINARY TRACT ULTRASOUND COMPLETE

[Series 1: us renal · 0.25mm/px · 14 of 46 slices shown]
[im 1/46]
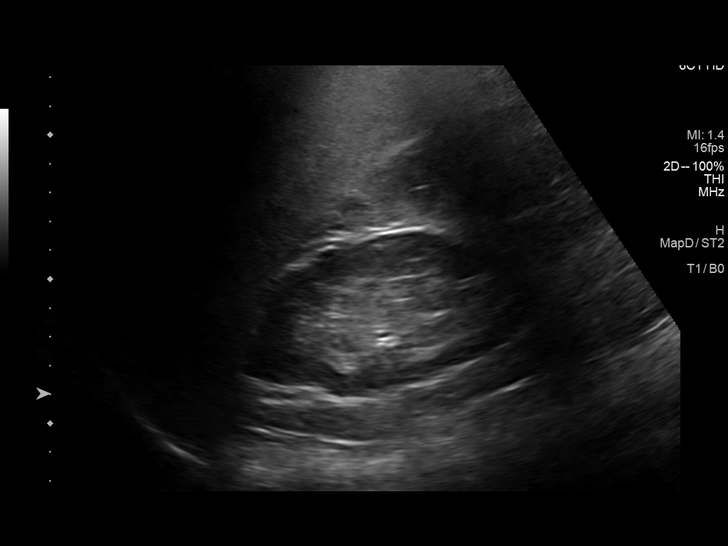
[im 4/46]
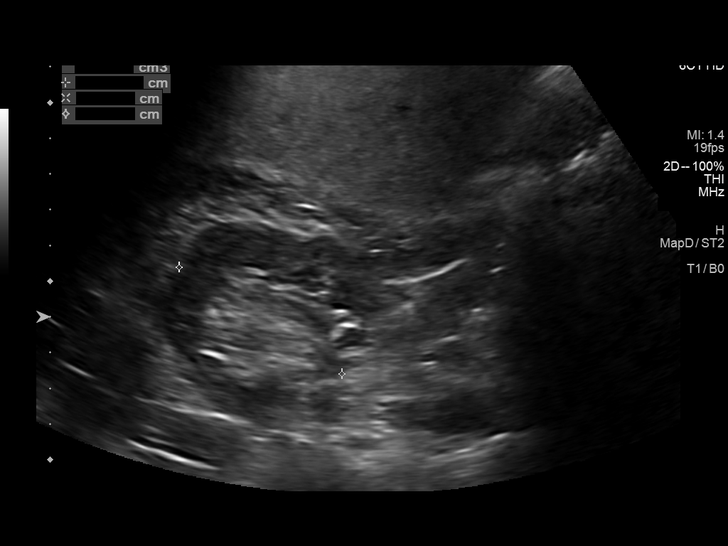
[im 8/46]
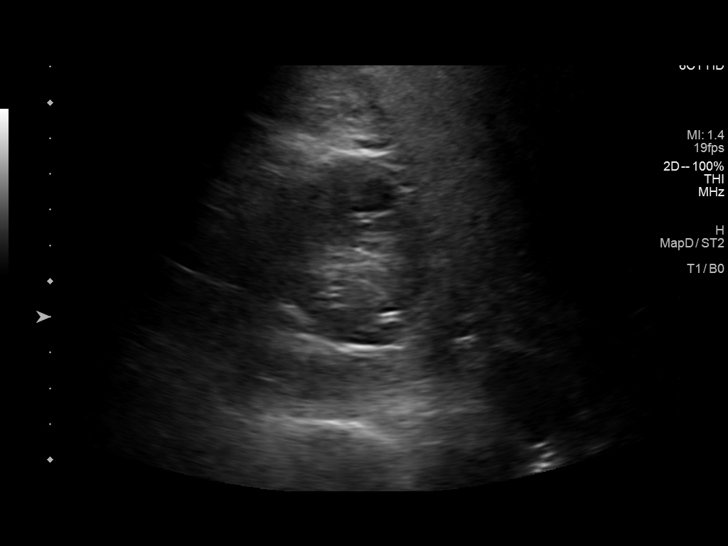
[im 12/46]
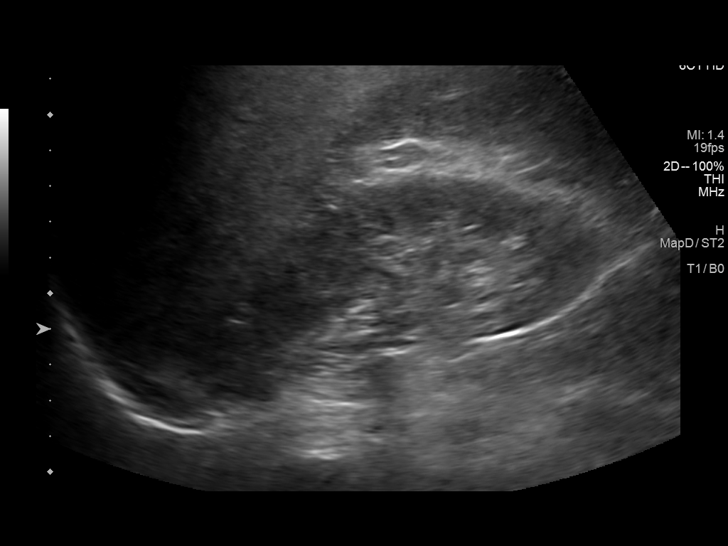
[im 16/46]
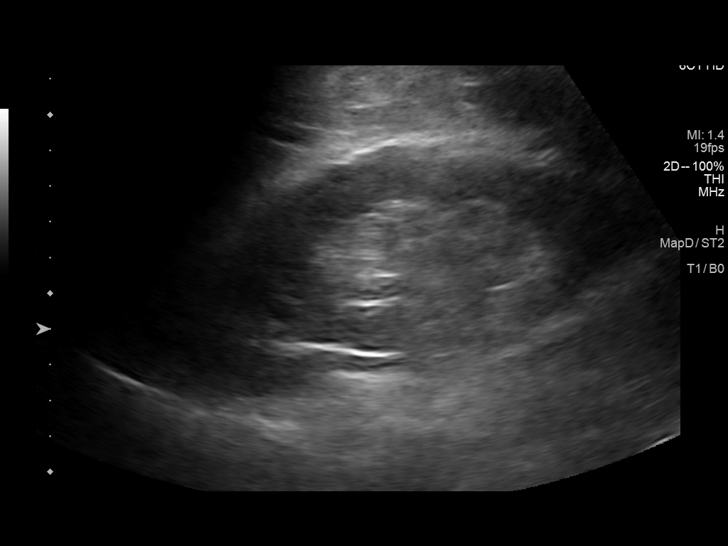
[im 17/46]
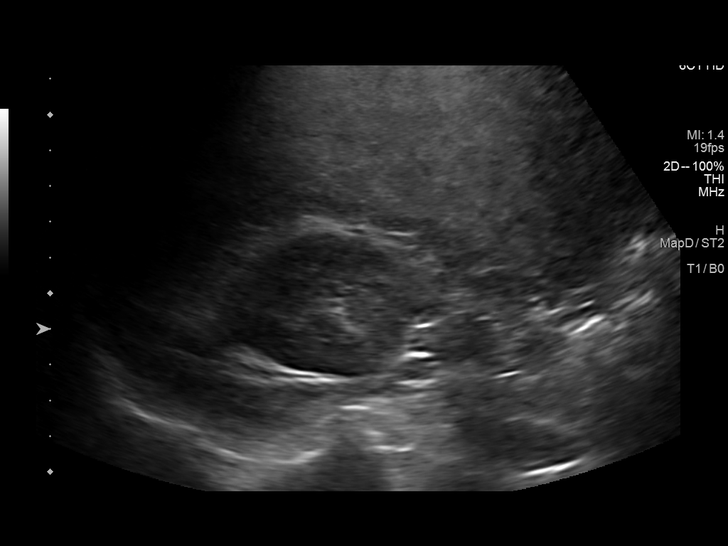
[im 21/46]
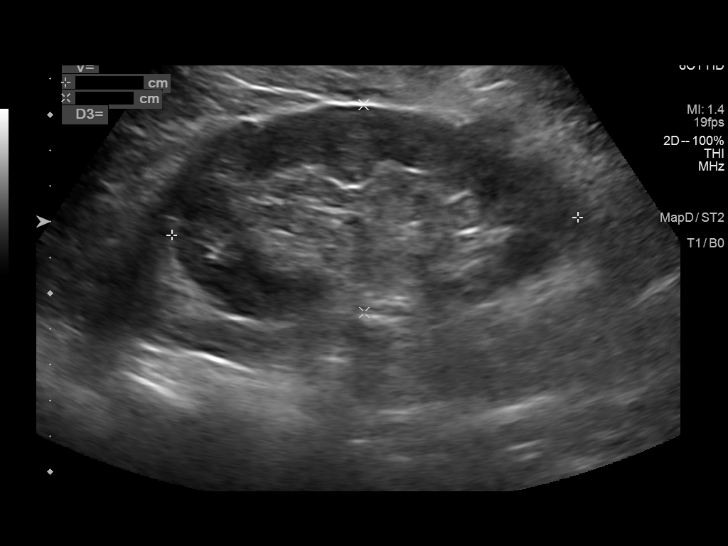
[im 25/46]
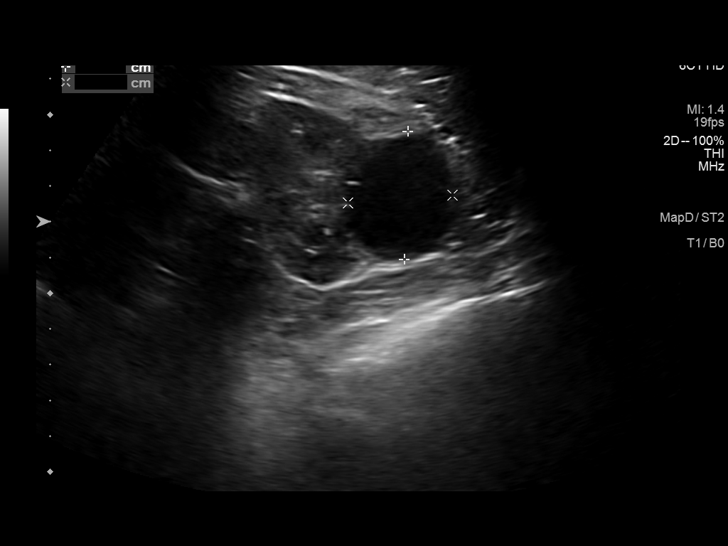
[im 29/46]
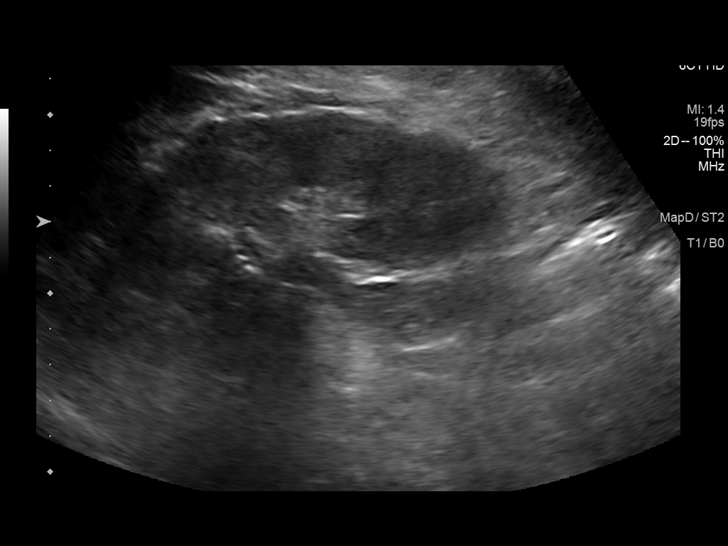
[im 31/46]
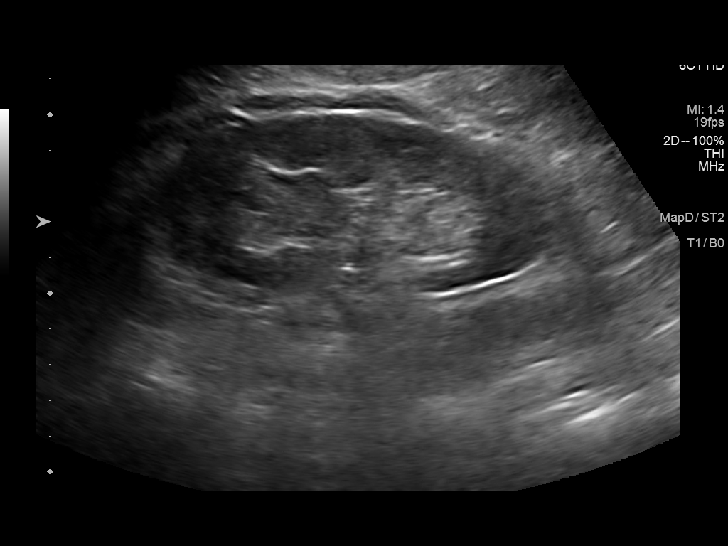
[im 34/46]
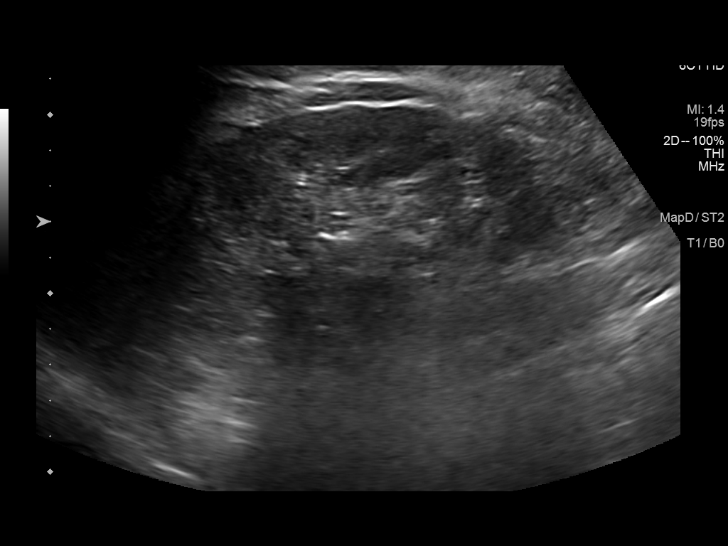
[im 38/46]
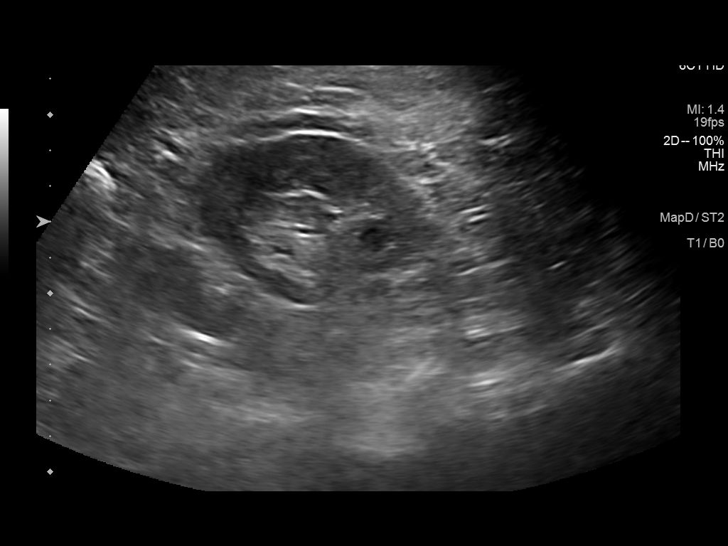
[im 42/46]
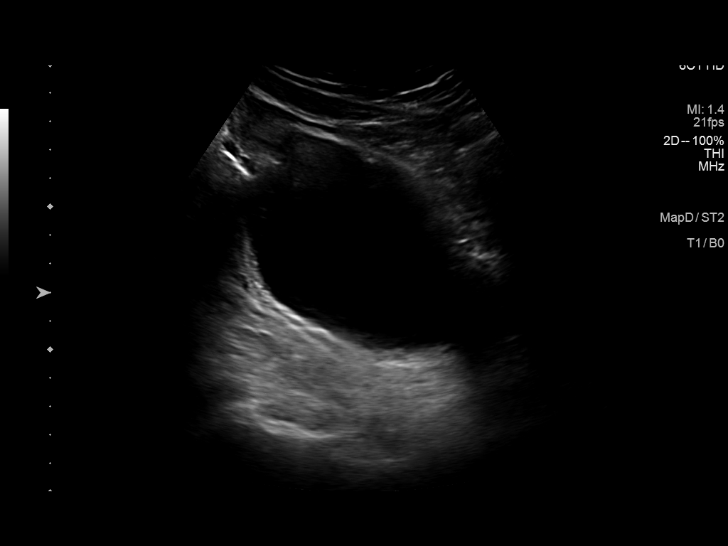
[im 46/46]
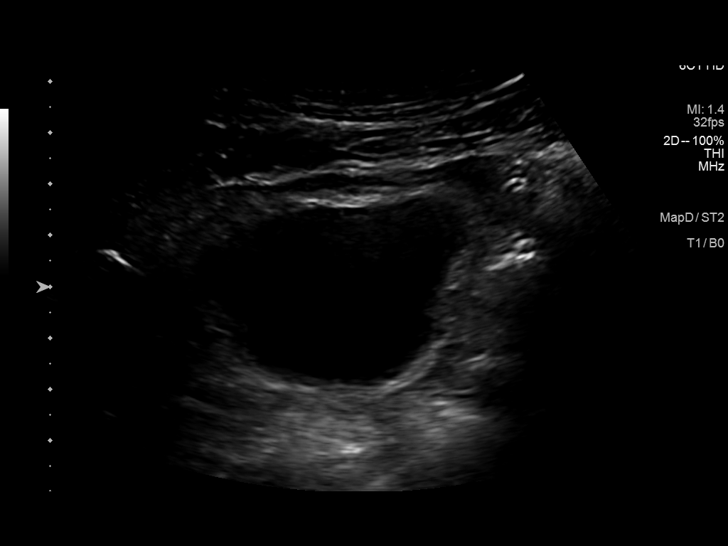

[14 of 25 positions shown; findings below may reference images not displayed]

FINDINGS: Right Kidney:

Renal measurements: 11.7 x 5.4 x 5.5 cm = volume: 181 mL. Normal
echotexture. 1.1 cm probable cyst in the upper pole. No
hydronephrosis.

Left Kidney:

Renal measurements: 11.4 x 5.8 x 5.1 cm = volume: 176 mL. 3.9 cm
probable cyst in the upper pole. No hydronephrosis.

Bladder:

Appears normal for degree of bladder distention.

Other:

None.
IMPRESSION: 1. Negative for hydronephrosis.
2. Bilateral renal cystic lesions.

## 2023-07-18 ENCOUNTER — Ambulatory Visit: Admitting: Internal Medicine

## 2023-07-20 DIAGNOSIS — M9903 Segmental and somatic dysfunction of lumbar region: Secondary | ICD-10-CM | POA: Diagnosis not present

## 2023-07-20 DIAGNOSIS — M51361 Other intervertebral disc degeneration, lumbar region with lower extremity pain only: Secondary | ICD-10-CM | POA: Diagnosis not present

## 2023-07-26 ENCOUNTER — Ambulatory Visit
Admission: RE | Admit: 2023-07-26 | Discharge: 2023-07-26 | Disposition: A | Discharge status: Home / Self Care | Source: Ambulatory Visit | Attending: Neurology | Admitting: Neurology

## 2023-07-26 ENCOUNTER — Ambulatory Visit
Admission: RE | Admit: 2023-07-26 | Discharge: 2023-07-26 | Discharge status: Home / Self Care | Source: Ambulatory Visit | Attending: Neurology

## 2023-07-26 ENCOUNTER — Other Ambulatory Visit

## 2023-07-26 DIAGNOSIS — G2581 Restless legs syndrome: Secondary | ICD-10-CM

## 2023-07-26 DIAGNOSIS — R519 Headache, unspecified: Secondary | ICD-10-CM

## 2023-07-26 DIAGNOSIS — H53149 Visual discomfort, unspecified: Secondary | ICD-10-CM

## 2023-07-26 DIAGNOSIS — R209 Unspecified disturbances of skin sensation: Secondary | ICD-10-CM

## 2023-07-26 DIAGNOSIS — Z0189 Encounter for other specified special examinations: Secondary | ICD-10-CM | POA: Diagnosis not present

## 2023-07-26 MED ORDER — GADOPICLENOL 0.5 MMOL/ML IV SOLN
9.0000 mL | Freq: Once | INTRAVENOUS | Status: AC | PRN
  Administered 2023-07-26: 9 mL via INTRAVENOUS

## 2023-07-26 NOTE — Telephone Encounter (Signed)
 Jay Fox

## 2023-07-27 ENCOUNTER — Ambulatory Visit: Payer: Self-pay | Admitting: Neurology

## 2023-08-04 ENCOUNTER — Ambulatory Visit (INDEPENDENT_AMBULATORY_CARE_PROVIDER_SITE_OTHER): Admitting: Internal Medicine

## 2023-08-04 ENCOUNTER — Encounter: Payer: Self-pay | Admitting: Internal Medicine

## 2023-08-04 VITALS — BP 120/64 | HR 76 | Temp 98.0°F | Ht 69.5 in | Wt 187.6 lb

## 2023-08-04 DIAGNOSIS — G4700 Insomnia, unspecified: Secondary | ICD-10-CM

## 2023-08-04 DIAGNOSIS — R2232 Localized swelling, mass and lump, left upper limb: Secondary | ICD-10-CM

## 2023-08-04 DIAGNOSIS — H5713 Ocular pain, bilateral: Secondary | ICD-10-CM | POA: Diagnosis not present

## 2023-08-04 DIAGNOSIS — L568 Other specified acute skin changes due to ultraviolet radiation: Secondary | ICD-10-CM | POA: Diagnosis not present

## 2023-08-04 MED ORDER — ZOLPIDEM TARTRATE 10 MG PO TABS: 10.0000 mg | ORAL_TABLET | Freq: Every evening | ORAL | 1 refills | Status: DC | PRN

## 2023-08-04 MED ORDER — KETOROLAC TROMETHAMINE 0.4 % OP SOLN: 1.0000 [drp] | Freq: Four times a day (QID) | OPHTHALMIC | 11 refills | Status: DC

## 2023-08-04 NOTE — Progress Notes (Signed)
 ==============================  [facility]: [phone]   -- Medical Office Visit --  Patient: Jay Fox      Age: 73 y.o.       Sex:  male  Date:   08/04/2023 Today's Healthcare Provider: Anthon Kins, MD  ==============================   Chief Complaint: eye issues (Pt states both eyes are giving him trouble not able see that well having a lot of headaches is like in the eyes. At night watching tv gets worse tried different glasses is not helping. Watching tv is getting hard to see. Lights in room today is to bright for eyes. Floaters in both eyes left eye is always worse then the right.)   Discussed the use of AI scribe software for clinical note transcription with the patient, who gave verbal consent to proceed.  History of Present Illness 73 year old male who presents with persistent light sensitivity and eye pain following cataract surgery.  He underwent cataract surgery in August and September of the previous year. Since the surgeries, he has experienced worsening light sensitivity and eye pain. Fluorescent lights, LEDs, and screens such as televisions and computers exacerbate his symptoms. He describes the pain as a stabbing sensation, like being poked with a needle, and notes excessive blinking in response to light exposure. Despite trying various glasses, including those with blue light filters, he has not found relief.  He has consulted multiple eye specialists, including a second opinion from another eye doctor and a neurologist who performed an MRI, which was reported as normal. He has been prescribed venlafaxine , which was increased since his last visit, but it causes fatigue. He has also tried dry eye drops without relief and does not experience a gritty sensation in his eyes.  His symptoms have worsened since March, and he is unable to tolerate artificial light, though natural light does not bother him. No halos, glares, or arcs  of light, but he does report floaters. He has a history of restless legs for which he takes Lyrica , which has been effective for that condition but not for his eye symptoms.  In addition to his eye issues, he mentions needing a knee replacement but has found some relief with red light therapy. He also has a bump on his finger that occasionally bothers him but is not a primary concern at this time.  He is currently taking Ambien  for sleep, which he breaks in half, but has been taking the full dose recently due to increased difficulty sleeping related to his eye issues.   Background Reviewed: Problem List: has Asthma; Hypertension; Seasonal allergies; Urinary incontinence; Skin lesions, generalized; Prostate cancer (HCC); Insomnia; Heart palpitations; Benign essential hypertension; Asthma, moderate persistent; History of renal cell carcinoma; Personal history of prostate cancer; History of radical prostatectomy; Leg swelling; Restless leg syndrome; Prediabetes; OSA (obstructive sleep apnea); Back wound; Generalized abdominal pain; High risk medication use; Abdominal distension; Altered olfactory perception; Jugular venous distension; Cervical lymphadenopathy; and Hyperlipidemia on their problem list. Past Medical History:  has a past medical history of Allergy (1953), Altered olfactory perception (05/31/2022), Anxiety, Arthritis, Asthma, BPH (benign prostatic hypertrophy), BPH (benign prostatic hypertrophy), Cancer (HCC), Cataract, Chronic kidney disease, Depression, GERD (gastroesophageal reflux disease), History of kidney cancer, History of prostate cancer (11/30/2021), Hyperlipidemia (12/08/2022), Hypertension, Leg swelling (11/30/2021), OSA (obstructive sleep apnea) (11/30/2021), Restless legs (11/30/2021), Seasonal allergies, and Sleep apnea. Past Surgical History:   has a past surgical history that includes Tonsillectomy; Renal cryoablation; Nasal sinus surgery (2019); and  Cataract extraction, bilateral  (Bilateral, 11/2022). Social History:   reports that he has never smoked. He has never used smokeless tobacco. He reports current alcohol use of about 10.0 standard drinks of alcohol per week. He reports that he does not use drugs. Family History:  family history includes Anxiety disorder in his mother; Cancer in his father, mother, paternal grandfather, and paternal grandmother; Colon cancer in his paternal grandfather and paternal grandmother; Emphysema in his mother; Hypertension in his father and mother; Liver cancer in his mother; Multiple myeloma in his father; Pancreatic cancer in his mother. Allergies:  is allergic to shellfish allergy and shellfish-derived products.   Medication Reconciliation: Current Outpatient Medications on File Prior to Visit  Medication Sig   albuterol  (PROVENTIL ) (2.5 MG/3ML) 0.083% nebulizer solution Take 3 mLs (2.5 mg total) by nebulization every 6 (six) hours as needed for wheezing or shortness of breath.   albuterol  (VENTOLIN  HFA) 108 (90 Base) MCG/ACT inhaler Inhale 2 puffs into the lungs every 6 (six) hours as needed for wheezing or shortness of breath.   amLODipine  (NORVASC ) 10 MG tablet Take 1 tablet (10 mg total) by mouth daily.   Ascorbic Acid (VITAMIN C PO) Take 1 tablet by mouth once. 500mg    budesonide -formoterol  (BREYNA ) 160-4.5 MCG/ACT inhaler Inhale 2 puffs into the lungs 2 (two) times daily.   calcium carbonate (TUMS - DOSED IN MG ELEMENTAL CALCIUM) 500 MG chewable tablet Chew 1 tablet by mouth as needed.   Calcium Carbonate-Vit D-Min (CALCIUM 1200 PO) Take 1 tablet by mouth daily.   Cholecalciferol (VITAMIN D3) 50 MCG (2000 UT) TABS Take 1 tablet by mouth daily.   diclofenac Sodium (VOLTAREN) 1 % GEL Place onto the skin.   fluticasone  (FLONASE ) 50 MCG/ACT nasal spray Place 2 sprays into both nostrils daily.   irbesartan  (AVAPRO ) 300 MG tablet Take 1 tablet by mouth once daily   loratadine  (CLARITIN ) 10 MG tablet Take 1 tablet (10 mg total) by  mouth daily.   Multiple Vitamin (MULTIVITAMIN) tablet Take 1 tablet by mouth daily.   Omega-3 Fatty Acids (FISH OIL) 1200 MG CAPS Take 1,200 mg by mouth in the morning and at bedtime.   pregabalin  (LYRICA ) 200 MG capsule Take 1 capsule (200 mg total) by mouth at bedtime.   Respiratory Therapy Supplies MISC Filters Hoses tubing masks headgear cleaning supplies for dreamwear and  philips respironics bipap machine.   Rimegepant Sulfate (NURTEC) 75 MG TBDP Take 1 tablet (75 mg total) by mouth daily as needed.   Saline (SIMPLY SALINE) 0.9 % AERS Place 2 each into the nose as directed. Use nightly for sinus hygiene long-term.  Can also be used as many times daily as desired to assist with clearing congested sinuses.   sildenafil (REVATIO) 20 MG tablet 1 tablet Orally Once a day   Spacer/Aero-Holding Chambers (AEROCHAMBER MV) inhaler Use as directed with albuterol  inhaler.   TURMERIC PO Take 1,000 mg by mouth in the morning and at bedtime.   venlafaxine  (EFFEXOR ) 100 MG tablet Take 1 tablet (100 mg total) by mouth daily.   vitamin B-12 (CYANOCOBALAMIN) 500 MCG tablet Take 1 tablet by mouth daily.   calcium carbonate (SUPER CALCIUM) 1500 (600 Ca) MG TABS tablet once. (Patient not taking: Reported on 08/04/2023)   Cholecalciferol (VITAMIN D PO) Take 1 tablet by mouth daily. (Patient not taking: Reported on 08/04/2023)   pseudoephedrine  (SUDAFED 12 HOUR) 120 MG 12 hr tablet Take 1 tablet (120 mg total) by mouth 2 (two) times daily. (Patient not  taking: Reported on 08/04/2023)   zolpidem  (AMBIEN ) 10 MG tablet Take 1 tablet (10 mg total) by mouth at bedtime as needed for sleep. Can NOT take with opioid pain medicine, alcohol, or other sedative.  Risk dementia/mornings impaired alertness   No current facility-administered medications on file prior to visit.  There are no discontinued medications.   Physical Exam:    08/04/2023    1:09 PM 07/08/2023   10:23 AM 07/06/2023    9:57 AM  Vitals with BMI  Height 5'  9.5" 5' 9.5" 5' 9.5"  Weight 187 lbs 10 oz 188 lbs 188 lbs 2 oz  BMI 27.32 27.37 27.39  Systolic 120 123 161  Diastolic 64 67 76  Pulse 76 85 78  Vital signs reviewed.  Nursing notes reviewed. Weight trend reviewed. Physical Exam General Appearance:  No acute distress appreciable.   Well-groomed, healthy-appearing male.  Well proportioned with no abnormal fat distribution.  Good muscle tone. Pulmonary:  Normal work of breathing at rest, no respiratory distress apparent. SpO2: 98 %  Musculoskeletal: All extremities are intact.  Neurological:  Awake, alert, oriented, and engaged.  No obvious focal neurological deficits or cognitive impairments.  Sensorium seems unclouded.   Speech is clear and coherent with logical content. Psychiatric:  Appropriate mood, pleasant and cooperative demeanor, thoughtful and engaged during the exam Physical Exam Wearing special glasses. Inappropriately Miotic pupils in light... even right after removing the shaded glasses. Photographs Taken 08/04/2023 :        No results found for any visits on 08/04/23. Office Visit on 04/28/2023  Component Date Value   Iron 04/28/2023 104    TIBC 04/28/2023 304    %SAT 04/28/2023 34    Ferritin 04/28/2023 168   Office Visit on 02/03/2023  Component Date Value   S' Lateral 02/23/2023 2.95    Area-P 1/2 02/23/2023 3.03    P 1/2 time 02/23/2023 935    Est EF 02/23/2023 60 - 65%    Sed Rate 02/03/2023 9    CRP 02/03/2023 <3.0   Clinical Support on 12/28/2022  Component Date Value   Cologuard 06/07/2022 Negative   Office Visit on 12/08/2022  Component Date Value   WBC 12/08/2022 6.0    RBC 12/08/2022 4.89    Platelets 12/08/2022 240.0    Hemoglobin 12/08/2022 14.2    HCT 12/08/2022 42.9    MCV 12/08/2022 87.8    MCHC 12/08/2022 33.1    RDW 12/08/2022 14.1    Sodium 12/08/2022 141    Potassium 12/08/2022 4.1    Chloride 12/08/2022 105    CO2 12/08/2022 27    Glucose, Bld 12/08/2022 109 (H)    BUN  12/08/2022 17    Creatinine, Ser 12/08/2022 1.02    Total Bilirubin 12/08/2022 0.7    Alkaline Phosphatase 12/08/2022 67    AST 12/08/2022 18    ALT 12/08/2022 21    Total Protein 12/08/2022 7.1    Albumin 12/08/2022 4.5    GFR 12/08/2022 73.99    Calcium 12/08/2022 9.9    Cholesterol 12/08/2022 134    Triglycerides 12/08/2022 227.0 (H)    HDL 12/08/2022 50.90    VLDL 12/08/2022 45.4 (H)    LDL Cholesterol 12/08/2022 38    Total CHOL/HDL Ratio 12/08/2022 3    NonHDL 12/08/2022 83.39    TSH 12/08/2022 2.28    Hgb A1c MFr Bld 12/08/2022 5.9   No image results found. MR BRAIN W WO CONTRAST Result Date: 07/27/2023 EXAMINATION: MR BRAIN W WO  CONTRAST HISTORY: headaches TECHNIQUE: MRI of the brain performed with and without IV contrast. CONTRAST: 9 mL Vueway  IV COMPARISON: None FINDINGS: No evidence for intracranial mass, hemorrhage, or acute infarct. There are mild bilateral periventricular and subcortical white matter T2 hyperintensities, which are nonspecific, but most likely secondary to chronic microvascular ischemia. The ventricles appear symmetric and the basilar cisterns are patent. There is mild to moderate paranasal sinus disease. There is a small right mastoid effusion. The orbits appear normal. No abnormal enhancement is appreciated following administration of intravenous contrast material. IMPRESSION: No acute intracranial findings. Mild chronic microvascular ischemic changes. Mild to moderate paranasal sinus disease with small right mastoid effusion. Electronically signed by: [place] Engel MD 07/27/2023 06:41 AM EDT RP Workstation: [id number]   DG Eye Foreign Body Result Date: 07/26/2023 CLINICAL DATA:  Metal working/exposure; clearance prior to MRI EXAM: ORBITS FOR FOREIGN BODY - 2 VIEW COMPARISON:  None Available. FINDINGS: There is no evidence of metallic foreign body within the orbits. No significant bone abnormality identified. IMPRESSION: No evidence of metallic foreign body  within the orbits. Electronically Signed   By: Denny Flack M.D.   On: 07/26/2023 11:24        08/04/2023    1:16 PM 01/18/2023   11:16 AM 12/28/2022   10:11 AM 12/08/2022   10:00 AM  PHQ 2/9 Scores  PHQ - 2 Score 1 4 1  0  PHQ- 9 Score 1 11 8 6    Results LABS Iron: within normal limits (04/2023) Cholesterol: elevated (11/2022) Glucose: elevated (11/2022)  DIAGNOSTIC Cologuard: negative (05/2022)    Assessment & Plan Pain of both eyes He has experienced persistent photophobia and ocular pain for nine months following cataract surgery, with sensitivity to artificial light, especially fluorescents and LEDs, and a stabbing ocular sensation. Current treatments, including venlafaxine  for nerve pain and Lyrica  for restless legs, have been ineffective for eye symptoms. Dry eye drops have also failed to provide relief. While ocular nerve damage is considered, symptoms do not fully align. Previous MRI and blood tests were unremarkable. Neuropathic pain is a possibility, but gabapentin was not started due to current Lyrica  use. A referral to neuro-ophthalmologist Dr. Quitman Bucy at [facility] is planned for further evaluation, as this is a rare case not typically managed by general ophthalmologists. Prescribe ketorolac  eye drops for ocular pain management. Continue Lyrica  for restless legs. Follow up in three months. Insomnia, unspecified type  Photosensitivity  Localized swelling on left hand Likely osteoarthritis Patient indicates they do not want XR or hand specialist referral yet. Photographs Taken 08/04/2023 :   '   ASSESSMENT AND PLAN   ??? 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 Symptoms predominantly triggered by artificial lighting (fluorescents, LEDs) with sparing of natural light tolerance. Patient describes needle-like  stabbing sensation with excessive blinking response. Multiple consultations including ophthalmology second opinion and neurology evaluation with normal brain MRI have failed to identify causative pathology. Current venlafaxine /pregabalin /fl41 glasses therapy ineffective for ocular symptoms despite dose escalation. Dry eye treatments unsuccessful with no reported gritty sensation. Symptoms represent significant functional impairment affecting screen use and indoor activities. ?? HIGH PRIORITY: Undiagnosed condition with 42-month duration and significant functional impairment. Failed multiple specialist evaluations requiring subspecialty referral.     ?? DIFFERENTIAL CONSIDERATIONS: Primary: Neuropathic ocular pain (corneal nerve damage) not responding to venlafaxine  and pregabalin  Secondary: Central sensitization syndrome Consider: Migraine-associated photophobia Rule out: Post-surgical  dysphotopsia Possible: Subclinical dry eye syndrome ?? PLAN - IMMEDIATE ACTIONS: ?? NEW: Ketorolac  0.4% eye drops QID for anti-inflammatory pain management ?? REFERRAL: Neuro-ophthalmologist (Dr. Quitman Bucy at [facility]) for specialized evaluation ? CONTINUE: Venlafaxine  100mg  daily (ineffective but avoid withdrawal) ?? RECOMMEND: FL-41 tinted lenses for light filtering (failing so far)   ?? MONITORING PARAMETERS:  Pain level (0-10 scale) and functional impact  Light sensitivity triggers and patterns  Response to ketorolac  therapy  Specialist consultation outcomes  Sleep quality improvement    ?? FOLLOW-UP: Return in 3 months to assess response to specialist evaluation and current interventions. Earlier return if symptoms worsen or new neurological symptoms develop.   ?? INSOMNIA, UNSPECIFIED TYPE (G47.00)  ASSESSMENT: Sleep disturbance exacerbated by ongoing ocular pain and light sensitivity. Patient reports increased difficulty with sleep initiation and maintenance related to eye symptoms. Previously effective half-dose  zolpidem  regimen requiring full dose due to symptom interference.  Consistent taper effort encouragement is given. ?? PLAN - MEDICATION MANAGEMENT: ?? CONTINUE: Zolpidem  10mg  at bedtime PRN for sleep (renewed prescription) ?? CAUTION: Monitor for medication dependency with long-term use ?? PRIMARY APPROACH: Address underlying eye pain as treatment strategy ?? EDUCATION: Sleep hygiene measures reinforced ?? FUTURE: Consider sleep study if symptoms persist after eye issue resolution     ? STABLE CHRONIC CONDITIONS - WELL CONTROLLED  ?? HYPERTENSION (I10): Well-controlled on irbesartan  300mg  daily. Current BP 120/64 [EXCELLENT]. Continue current regimen with home monitoring. ?? ASTHMA, MODERATE PERSISTENT (J45.40): Stable on budesonide -formoterol  combination therapy. No acute exacerbations. SpO2 98%. Continue current regimen. ?? RESTLESS LEG SYNDROME (G25.81): Well-controlled on pregabalin  200mg  at bedtime. Patient reports effective symptom management. Continue current dose. ?? HYPERLIPIDEMIA (E78.5): Recent labs show triglycerides 227 [ELEVATED] but LDL 38 [EXCELLENT]. Continue omega-3 supplementation. ?? PREDIABETES (R73.03): A1c stable at 5.9% [GOOD CONTROL]. Glucose control adequate. Continue lifestyle modifications. ??? CANCER HISTORY: History of prostate (Z85.46) and renal cell carcinoma (R60.454) with appropriate surveillance. Recent Cologuard negative.     ??? HEALTH MAINTENANCE AND MONITORING  ? RECENT SURVEILLANCE - UP TO DATE: ?? Brain MRI with contrast: Normal (Jul 27, 2023) ??? Orbit X-ray: No foreign body (Jul 26, 2023) ? Cologuard: Negative (June 07, 2022) ? Iron studies: Normal (April 28, 2023) ?? MONITORING NEEDS - DUE: ?? Annual lipid panel: Due (elevated triglycerides need follow-up) ??? Cancer surveillance: Per oncology schedule ? Blood pressure monitoring: Continue home readings ?  A1c monitoring: For prediabetes surveillance     VISIT SUMMARY   ?? KEY  CLINICAL DECISIONS: ?? NEW TREATMENT: Initiated ketorolac  eye drops for anti-inflammatory management of ocular pain ?? SPECIALIST REFERRAL: Arranged neuro-ophthalmology consultation (Dr. Quitman Bucy at [facility]) for expert evaluation ?? SYMPTOM MANAGEMENT: Adjusted sleep medication approach to address symptom-related insomnia ? CHRONIC CARE: Maintained stable management of multiple well-controlled chronic conditions ?? DIFFERENTIAL DIAGNOSIS: Reviewed comprehensive differential including neuropathic pain syndromes ?? NEXT STEPS: Await neuro-ophthalmology consultation while managing symptoms with topical anti-inflammatory therapy. Return in 3 months unless symptoms worsen or specialist recommends earlier follow-up. Patient education provided on warning signs requiring immediate attention.            Orders Placed During this Encounter:   Ambulatory referral to Ophthalmology       Comments: request neurophthalmological evn for peristent eye pain and photophobia after cataract lens replacements.  Prior ophthalmologist x2 nor neurologist can find source of ongoing eye pain and severe light sensitivity.    ketorolac  (ACULAR ) 0.4 % SOLN  4 times daily         zolpidem  (  AMBIEN ) 10 MG tablet  At bedtime PRN               MEDICAL DECISION MAKING: HIGH COMPLEXITY (LEVEL 4)   Problems Addressed (High Complexity)  Undiagnosed new problem with uncertain prognosis requiring extensive workup Persistent severe photophobia and ocular pain post-cataract surgery Nine months duration with significant functional impairment Failed multiple specialist evaluations and treatments Normal brain MRI and ophthalmologic examinations Multiple stable chronic conditions requiring ongoing management Hypertension, asthma, hyperlipidemia, prediabetes History of prostate and renal cell carcinoma Secondary insomnia related to primary eye symptoms     Data Reviewed and Analyzed (Extensive)  Independent review and  interpretation of brain MRI with contrast (May 2025) Analysis of orbit X-ray for foreign body exclusion Review of comprehensive metabolic panel and lipid studies Assessment of iron studies and hematologic parameters Correlation of multiple prior ophthalmology evaluations Integration of neurology consultation findings Review of extensive medication history and responses Analysis of PHQ scores showing depression improvement     Risk Assessment (High)  Undiagnosed condition with uncertain prognosis and potential for progression Significant functional impairment affecting quality of life Risk of chronic pain syndrome development Complex medication management with multiple drug interactions Multiple chronic conditions requiring careful monitoring Cancer history requiring ongoing surveillance Risk of depression exacerbation due to chronic symptoms Potential for medication dependency with chronic sleep aids     Complex Management Decisions  Initiated ketorolac  eye drops after weighing anti-inflammatory benefits against potential ocular surface risks Arranged specialized neuro-ophthalmology referral given failure of conventional approaches Decided against gabapentin trial due to current pregabalin  therapy and interaction concerns Continued venlafaxine  despite limited efficacy to avoid withdrawal complications Adjusted sleep medication approach while monitoring for dependency risk Maintained complex chronic disease management with multiple medication interactions  Medical Necessity Statement: This encounter demonstrates high complexity medical decision making due to evaluation and management of an undiagnosed new problem with uncertain prognosis (persistent post-cataract photophobia and pain) that has failed multiple specialist evaluations. Extensive data review included independent interpretation of recent brain MRI and correlation of multiple prior consultations. Risk level is high due to the  uncertain nature of the condition, potential for chronic pain syndrome development, and complex medication management in a patient with multiple chronic conditions and cancer history. The decision to initiate topical anti-inflammatory therapy and arrange specialized neuro-ophthalmology consultation required careful consideration of treatment options and risks in this challenging diagnostic scenario.         This document was synthesized by artificial intelligence (Abridge) using HIPAA-compliant recording of the clinical interaction;   We discussed the use of AI scribe software for clinical note transcription with the patient, who gave verbal consent to proceed. additional Info: This encounter employed state-of-the-art, real-time, collaborative documentation. The patient actively reviewed and assisted in updating their electronic medical record on a shared screen, ensuring transparency and facilitating joint problem-solving for the problem list, overview, and plan. This approach promotes accurate, informed care. The treatment plan was discussed and reviewed in detail, including medication safety, potential side effects, and all patient questions. We confirmed understanding and comfort with the plan. Follow-up instructions were established, including contacting the office for any concerns, returning if symptoms worsen, persist, or new symptoms develop, and precautions for potential emergency department visits.

## 2023-08-17 DIAGNOSIS — M9903 Segmental and somatic dysfunction of lumbar region: Secondary | ICD-10-CM | POA: Diagnosis not present

## 2023-08-23 DIAGNOSIS — H26493 Other secondary cataract, bilateral: Secondary | ICD-10-CM | POA: Diagnosis not present

## 2023-08-23 DIAGNOSIS — H33002 Unspecified retinal detachment with retinal break, left eye: Secondary | ICD-10-CM | POA: Diagnosis not present

## 2023-08-23 DIAGNOSIS — H43813 Vitreous degeneration, bilateral: Secondary | ICD-10-CM | POA: Diagnosis not present

## 2023-08-23 DIAGNOSIS — H1045 Other chronic allergic conjunctivitis: Secondary | ICD-10-CM | POA: Diagnosis not present

## 2023-08-29 DIAGNOSIS — M9903 Segmental and somatic dysfunction of lumbar region: Secondary | ICD-10-CM | POA: Diagnosis not present

## 2023-08-31 DIAGNOSIS — M9903 Segmental and somatic dysfunction of lumbar region: Secondary | ICD-10-CM | POA: Diagnosis not present

## 2023-09-05 DIAGNOSIS — K080 Exfoliation of teeth due to systemic causes: Secondary | ICD-10-CM | POA: Diagnosis not present

## 2023-09-07 DIAGNOSIS — M9903 Segmental and somatic dysfunction of lumbar region: Secondary | ICD-10-CM | POA: Diagnosis not present

## 2023-09-09 ENCOUNTER — Other Ambulatory Visit: Payer: Self-pay | Admitting: Internal Medicine

## 2023-09-09 DIAGNOSIS — I10 Essential (primary) hypertension: Secondary | ICD-10-CM

## 2023-09-14 DIAGNOSIS — M9903 Segmental and somatic dysfunction of lumbar region: Secondary | ICD-10-CM | POA: Diagnosis not present

## 2023-09-14 DIAGNOSIS — M51361 Other intervertebral disc degeneration, lumbar region with lower extremity pain only: Secondary | ICD-10-CM | POA: Diagnosis not present

## 2023-09-16 ENCOUNTER — Other Ambulatory Visit: Payer: Self-pay | Admitting: Internal Medicine

## 2023-09-16 DIAGNOSIS — I10 Essential (primary) hypertension: Secondary | ICD-10-CM

## 2023-09-28 DIAGNOSIS — M9903 Segmental and somatic dysfunction of lumbar region: Secondary | ICD-10-CM | POA: Diagnosis not present

## 2023-10-03 ENCOUNTER — Encounter: Payer: Self-pay | Admitting: Internal Medicine

## 2023-10-03 DIAGNOSIS — H5713 Ocular pain, bilateral: Secondary | ICD-10-CM

## 2023-10-04 DIAGNOSIS — H5713 Ocular pain, bilateral: Secondary | ICD-10-CM | POA: Insufficient documentation

## 2023-10-12 DIAGNOSIS — M51361 Other intervertebral disc degeneration, lumbar region with lower extremity pain only: Secondary | ICD-10-CM | POA: Diagnosis not present

## 2023-10-12 DIAGNOSIS — M25561 Pain in right knee: Secondary | ICD-10-CM | POA: Diagnosis not present

## 2023-10-12 DIAGNOSIS — M9903 Segmental and somatic dysfunction of lumbar region: Secondary | ICD-10-CM | POA: Diagnosis not present

## 2023-10-17 ENCOUNTER — Encounter: Payer: Self-pay | Admitting: Internal Medicine

## 2023-10-17 ENCOUNTER — Ambulatory Visit (INDEPENDENT_AMBULATORY_CARE_PROVIDER_SITE_OTHER): Admitting: Internal Medicine

## 2023-10-17 VITALS — BP 120/64 | HR 80 | Temp 98.0°F | Ht 69.5 in | Wt 185.0 lb

## 2023-10-17 DIAGNOSIS — Z01818 Encounter for other preprocedural examination: Secondary | ICD-10-CM | POA: Diagnosis not present

## 2023-10-17 DIAGNOSIS — H5713 Ocular pain, bilateral: Secondary | ICD-10-CM

## 2023-10-17 DIAGNOSIS — M1711 Unilateral primary osteoarthritis, right knee: Secondary | ICD-10-CM

## 2023-10-17 DIAGNOSIS — I2584 Coronary atherosclerosis due to calcified coronary lesion: Secondary | ICD-10-CM | POA: Diagnosis not present

## 2023-10-17 DIAGNOSIS — I2510 Atherosclerotic heart disease of native coronary artery without angina pectoris: Secondary | ICD-10-CM

## 2023-10-17 MED ORDER — ASPIRIN 81 MG PO TBEC: 81.0000 mg | DELAYED_RELEASE_TABLET | Freq: Every day | ORAL | Status: AC

## 2023-10-17 MED ORDER — ERYTHROMYCIN 5 MG/GM OP OINT: 1.0000 | TOPICAL_OINTMENT | Freq: Every day | OPHTHALMIC | 0 refills | Status: DC

## 2023-10-17 MED ORDER — BROMFENAC SODIUM 0.07 % OP SOLN: 2.0000 [drp] | Freq: Every day | OPHTHALMIC | 2 refills | Status: DC | PRN

## 2023-10-17 MED ORDER — ROSUVASTATIN CALCIUM 20 MG PO TABS: 20.0000 mg | ORAL_TABLET | Freq: Every day | ORAL | 1 refills | Status: AC

## 2023-10-17 NOTE — Patient Instructions (Addendum)
 It was a pleasure seeing you today! Your health and satisfaction are our top priorities.  Jay Cone, MD  VISIT SUMMARY: During your visit, we discussed your persistent eye pain and sensitivity to light following cataract surgery, as well as your severe right knee pain and upcoming knee replacement surgery. We also reviewed your hypertension, asthma, coronary artery atherosclerosis, and hyperlipidemia.  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 We will prescribe Bromfenac  eye drops for pain relief and refer you to a pain specialist for an ocular nerve block. Continue using blue light blocking glasses and try different light bulbs to reduce sensitivity. Adjust the color temperature settings on your electronic devices. Narcotics will be considered only if other treatments fail.  -RIGHT KNEE OSTEOARTHRITIS, SEVERE, PENDING TOTAL KNEE REPLACEMENT: Your severe knee pain is due to advanced osteoarthritis, and you are scheduled for a total knee replacement. You have been medically cleared for surgery. Continue taking aspirin  and your cholesterol medication up until the day before surgery, but stop aspirin  five days prior. We discussed the risks of surgery, including blood clots, infections, and other complications.  -HYPERTENSION: Your high blood pressure is noted, especially during severe pain episodes. We will continue to monitor this condition.  -ASTHMA, MODERATE PERSISTENT, CURRENTLY STABLE: Your asthma is currently stable with no significant issues noted. Continue with your current management plan.  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 There are no acute issues at  this time. We recommend starting aspirin  and a cholesterol pill to help stabilize the plaque and reduce your risk of heart events.  -HYPERLIPIDEMIA: You have high cholesterol levels. We will manage this with medication and lifestyle changes.  INSTRUCTIONS: Please follow up with the pain specialist for the ocular nerve block. Continue taking your medications as prescribed and make the necessary adjustments to your light exposure and electronic device settings. Ensure you stop taking aspirin  five days before your knee replacement surgery. Follow up with your primary care physician to monitor your hypertension and other conditions.  Your Providers PCP: Fox Jay MATSU, MD,  [phone]) Referring Provider: Cone Jay MATSU, MD,  [phone]) Care Team Provider: Brien Belvie BRAVO, MD,  [phone]) Care Team Provider: Leigh Venetia CROME, MD,  [phone])  NEXT STEPS: [x]  Early Intervention: Schedule sooner appointment, call our on-call services, or go to emergency room if there is any significant Increase in pain or discomfort New or worsening symptoms Sudden or severe changes in your health [x]  Flexible Follow-Up: We recommend a No follow-ups on file. for optimal routine care. This allows for progress monitoring and treatment adjustments. [x]  Preventive Care: Schedule your annual preventive care visit! It's typically covered by insurance and helps identify potential health issues early. [x]  Lab & X-ray Appointments: Incomplete tests scheduled today, or call to schedule. X-rays: [facility] (M-F, 8:30am-noon or 1pm-5pm). [x]  Medical Information Release: Sign a release form at front desk to obtain relevant medical information we don't have.  MAKING THE MOST OF OUR FOCUSED 20 MINUTE APPOINTMENTS: [x]   Clearly state your top concerns at the beginning of the visit to focus our discussion [x]   If you anticipate you will need more time, please inform the front desk during  scheduling - we can book multiple  appointments in the same week. [x]   If you have transportation problems- use our convenient video appointments or ask about transportation support. [x]   We can get down to business faster if you use MyChart to update information before the visit and submit non-urgent questions before your visit. Thank you for taking the time to provide details through MyChart.  Let our nurse know and she can import this information into your encounter documents.  Arrival and Wait Times: [x]   Arriving on time ensures that everyone receives prompt attention. [x]   Early morning (8a) and afternoon (1p) appointments tend to have shortest wait times. [x]   Unfortunately, we cannot delay appointments for late arrivals or hold slots during phone calls.  Getting Answers and Following Up [x]   Simple Questions & Concerns: For quick questions or basic follow-up after your visit, reach us  at [phone] or MyChart messaging. [x]   Complex Concerns: If your concern is more complex, scheduling an appointment might be best. Discuss this with the staff to find the most suitable option. [x]   Lab & Imaging Results: We'll contact you directly if results are abnormal or you don't use MyChart. Most normal results will be on MyChart within 2-3 business days, with a review message from Dr. Jesus. Haven't heard back in 2 weeks? Need results sooner? Contact us  at [phone]. [x]   Referrals: Our referral coordinator will manage specialist referrals. The specialist's office should contact you within 2 weeks to schedule an appointment. Call us  if you haven't heard from them after 2 weeks.  Staying Connected [x]   MyChart: Activate your MyChart for the fastest way to access results and message us . See the last page of this paperwork for instructions on how to activate.  Bring to Your Next Appointment [x]   Medications: Please bring all your medication bottles to your next appointment to ensure we have an  accurate record of your prescriptions. [x]   Health Diaries: If you're monitoring any health conditions at home, keeping a diary of your readings can be very helpful for discussions at your next appointment.  Billing [x]   X-ray & Lab Orders: These are billed by separate companies. Contact the invoicing company directly for questions or concerns. [x]   Visit Charges: Discuss any billing inquiries with our administrative services team.  Your Satisfaction Matters [x]   Share Your Experience: We strive for your satisfaction! If you have any complaints, or preferably compliments, please let Dr. Jesus know directly or contact our Practice Administrators, Manuelita Rubin or [employer], by asking at the front desk.   Reviewing Your Records [x]   Review this early draft of your clinical encounter notes below and the final encounter summary tomorrow on MyChart after its been completed.  All orders placed so far are visible here: Preoperative examination  Coronary artery disease due to calcified coronary lesion -     Rosuvastatin  Calcium ; Take 1 tablet (20 mg total) by mouth daily.  Dispense: 90 tablet; Refill: 1 -     Aspirin ; Take 1 tablet (81 mg total) by mouth daily. Swallow whole.  Pain of both eyes -     Erythromycin ; Place 1 Application into both eyes at bedtime.  Dispense: 3.5 g; Refill: 0 -     Ambulatory referral to Ophthalmology -     Bromfenac  Sodium; Apply 2 drops to eye daily as needed.  Dispense: 3 mL; Refill: 2 -     Ambulatory referral to Pain Clinic  Primary osteoarthritis of right knee

## 2023-10-17 NOTE — Progress Notes (Signed)
 Patient Care Team: Jay [name], MD as PCP - General (Internal Medicine) Jay Belvie BRAVO, MD as Attending Physician (Pulmonary Disease) Jay Venetia CROME, MD as Consulting Physician (Neurology) Patient:  Jay Fox Today's Healthcare Provider: Bernardino Fox Jesus, MD   Chief Complaint:  Requests consultative surgical examination, consent, and appropriate evaluation at the request of Dr. 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 Previous treatments, including blue light glasses and various eye drops, have been ineffective. Venlafaxine  has reduced headache severity. Gabapentin was considered but not prescribed due to concurrent pregabalin  use. Prescribe Bromfenac  eye drops for pain relief. Refer to a pain specialist for an ocular nerve block. Encourage the use of blue light blocking glasses and experimentation with different light bulbs to reduce photophobia. Advise adjusting color temperature settings on electronic devices. Consider narcotics only if all other treatments fail and pain remains severe. Preoperative examination Coronary artery disease due to calcified coronary lesion Primary osteoarthritis of right knee Today's consultation included a comprehensive preoperative evaluation to assess this patients overall health and determine the appropriateness of proceeding with the planned R total knee arthroplasty / replacement, given their current medical status. I emphasized that while we take all necessary precautions to minimize risks, no surgery is entirely without risk, and this evaluation helps us  identify and mitigate potential risks to the greatest extent possible.  Together, we explored all available options to ensure the approach aligned with the patient's values and preferences. I used open communication techniques to explain the potential risks and benefits, reviewing the risk calculations detailed in the HPI with the patient. All specific procedural risks listed in the ACS NSQIP risk calculator, along with patient-specific risks, were explicitly explained and considered. The patient was also made aware that unforeseen and unpredictable risks can occur.  To comprehensively assess the patient's perioperative risk, we utilized several validated risk stratification tools:  Based on these assessments, the planned procedure, R total knee arthroplasty / replacement  is classified as intermediate risk (1-5%) while he is below average risk patient for the procedure.  Considering the patient's overall clinical picture, including previous workup for his heart in November 2024 excellent support from his wife.  Good scores on preoperative risk classification risk stratification tools, and  I have stratified their patient-specific cardiac risk as low although he does have some evidence of coronary artery disease.  Therefore, the following recommendations are made: Proceed to surgery with close monitoring as planned in September with further workup required He has not been taking aspirin  or statin I advised him to take these and hold the statin on the day of the procedure and the aspirin  for 5 days prior to the procedure  No additional preoperative cardiac testing is recommended at this time based on the patient's low risk profile and current guidelines, specifically the 2022 ACC/AHA Guideline on Perioperative Cardiovascular Evaluation for Noncardiac Surgery and relevant UpToDate summaries.].  Recommend intraoperative EKG due to the coronary artery disease  I engaged in a detailed discussion with the patient regarding these risks, benefits, and recommendations, thoroughly  explaining the implications of each risk assessment tool (RCRI, NSQIP, and DASI) and how they contribute to the overall risk stratification. I used clarifying questions and summaries (teach-back  method) to ensure their complete understanding of the information presented, including their individual risk profile and the rationale for recommended testing and monitoring. The patient demonstrated a clear understanding of these elements and possesses the capacity to make informed decisions regarding their care. We also discussed alternative treatment options, including [list alternatives], and the patient's rationale for proceeding with surgery was [document their reasoning].   Despite any recommendations I may provide, the patient was empowered with the ultimate right to choose the course of treatment that best suits him. This documented discussion will be readily available to the patient in their MyChart.    Right knee osteoarthritis, severe, pending total knee replacement   Severe right knee osteoarthritis causes significant pain and mobility limitations. He is awaiting total knee replacement surgery and requires medical clearance. Recent cardiology evaluation and echocardiogram show no acute issues. He can walk and bike without significant cardiac symptoms. Discussed risks of surgery, including blood clots, infections, and other complications. He has a 1.4% chance of serious complications, 2.1% chance of any complication, 0.8% chance of blood clots, and 1.2% chance of surgical site infection. Provide medical clearance for surgery. Recommend taking aspirin  and a cholesterol pill up until the day before surgery, stopping aspirin  five days prior.  Coronary artery atherosclerosis with elevated coronary calcium  score   Coronary artery atherosclerosis is indicated by an elevated calcium  score, showing plaque presence. There is no history of heart attack or significant cardiac events. Recent cardiology evaluation  and echocardiogram show no acute issues. Recommend starting aspirin  and a cholesterol pill to stabilize plaque and reduce the risk of cardiac events.      Subjective:  HPI  Discussed the use of AI scribe software for clinical note transcription with the patient, who gave verbal consent to proceed.  History of Present Illness 73 year old male who presents with persistent eye pain and sensitivity to light post-cataract surgery.  He experiences persistent eye pain following cataract surgery, described as 'never ending.' Multiple ophthalmologists have been consulted, and various blue light glasses have been tried without relief. The pain and headaches intensify in environments with fluorescent or LED lighting. He has not yet seen a neuro-ophthalmologist.  He takes venlafaxine  100 mg, which has reduced the severity of his headaches but not eliminated them. Various eye drops, including Systane and Retain, provide some relief, and a heating pad gel mask is also helpful. He has not tried ointments or scrubs for his eyes.  He experiences tinnitus, described as a buzzing sound, which coincides with the onset of headaches and increased eye blinking. Symptoms are primarily triggered by certain types of lighting, such as computer blue light and fluorescent lights.  He has a history of high blood pressure, with readings reaching 177/85 during severe episodes of eye pain. He attributes some shortness of breath to his asthma.  He is awaiting a right total knee replacement due to severe knee pain, which has worsened over the past few months, affecting his mobility and daily activities. He reports difficulty getting out of bed due to knee pain and uses a brace to walk. He is able to ride an electric bike for miles, which helps maintain some level of physical activity.    Estimation of METS by discussion: Can do heavy work around the house, such as scrubbing floors or lifting or moving heavy furniture, or climb  two flights of stairs (between 4 and 10 METs)   DASI calculated mets The higher the DASI score, the more physically active the  patient is. Patients who can achieve <4 METs have poor functional capacity, 4 to 10 METs suggest moderate functional capacity, and >10 METs suggest excellent functional capacity. The DASI questionnaire is not designed to assess very high levels of physical activity. The maximum DASI score is 58.2, which would be the equivalent of 9.89 METs.  Its controversial but many clinicians use a cutoff DASI of less than 25-34 as a marker of increased surgical risk.    Curator of Surgeons Surgical Risk (ACS NSQIP) This calculator is best for calculating surgery specific risk - High risk - >5 percent - Intermediate risk - >=1 to <=5 percent - Low risk - <1 percent    His percent was low average, highest risks blood clot, surgical site infection, and readmission to long term care, all around 1%  MICA Zane) perioperative risk  RCRI Calculated Risk     ROS   unless noted otherwise, a complete review of systems was negative.    Past Medical History:  Diagnosis Date   Allergy 1953   Altered olfactory perception 05/31/2022   Smells cigarettes sometimes     Anxiety    Arthritis    Asthma    BPH (benign prostatic hypertrophy)    BPH (benign prostatic hypertrophy)    Cancer (HCC)    Cataract    Chronic kidney disease    Depression    GERD (gastroesophageal reflux disease)    History of kidney cancer    History of prostate cancer 11/30/2021   Hyperlipidemia 12/08/2022   Hypertension    Leg swelling 11/30/2021   Noticed early 2023 with ferritin deposition.    OSA (obstructive sleep apnea) 11/30/2021   Restless legs 11/30/2021   Seasonal allergies    Sleep apnea    Patient Active Problem List   Diagnosis Date Noted   Ocular pain, bilateral 10/04/2023   Jugular venous distension 12/08/2022   Cervical lymphadenopathy 12/08/2022   Hyperlipidemia  12/08/2022   Altered olfactory perception 05/31/2022   Back wound 03/29/2022   Generalized abdominal pain 03/29/2022   High risk medication use 03/29/2022   Abdominal distension 03/29/2022   Personal history of prostate cancer 11/30/2021   History of radical prostatectomy 11/30/2021   Leg swelling 11/30/2021   Restless leg syndrome 11/30/2021   Prediabetes 11/30/2021   OSA (obstructive sleep apnea) 11/30/2021   History of renal cell carcinoma 02/22/2019   Prostate cancer (HCC) 11/21/2017   Insomnia 09/08/2012   Urinary incontinence 05/15/2012   Skin lesions, generalized 05/15/2012   Asthma    Hypertension    Seasonal allergies    Heart palpitations 04/12/2011   Benign essential hypertension 04/12/2011   Asthma, moderate persistent 04/12/2011   Past Surgical History:  Procedure Laterality Date   CATARACT EXTRACTION, BILATERAL Bilateral 11/2022   10/24 left   NASAL SINUS SURGERY  2019   RENAL CRYOABLATION     TONSILLECTOMY      Family History  Problem Relation Age of Onset   Emphysema Mother    Liver cancer Mother    Pancreatic cancer Mother    Anxiety disorder Mother    Cancer Mother    Hypertension Mother    Multiple myeloma Father    Cancer Father    Hypertension Father    Cancer Paternal Grandmother    Colon cancer Paternal Grandmother    Cancer Paternal Grandfather    Colon cancer Paternal Grandfather     Medications were reconciled and perioperative medicine management was discussed Current Outpatient Medications  Medication Sig Dispense Refill   albuterol  (VENTOLIN  HFA) 108 (90 Base) MCG/ACT inhaler Inhale 2 puffs into the lungs every 6 (six) hours as needed for wheezing or shortness of breath. 18 g 11   amLODipine  (NORVASC ) 10 MG tablet Take 1 tablet by mouth daily 90 tablet 0   Ascorbic Acid (VITAMIN C PO) Take 1 tablet by mouth once. 500mg      budesonide -formoterol  (BREYNA ) 160-4.5 MCG/ACT inhaler Inhale 2 puffs into the lungs 2 (two) times daily.      calcium  carbonate (TUMS - DOSED IN MG ELEMENTAL CALCIUM ) 500 MG chewable tablet Chew 1 tablet by mouth as needed.     Calcium  Carbonate-Vit D-Min (CALCIUM  1200 PO) Take 1 tablet by mouth daily.     Cholecalciferol (VITAMIN D3) 50 MCG (2000 UT) TABS Take 1 tablet by mouth daily.     citalopram  (CELEXA ) 10 MG tablet Take 10 mg by mouth daily.     diclofenac Sodium (VOLTAREN) 1 % GEL Place onto the skin.     fluticasone  (FLONASE ) 50 MCG/ACT nasal spray Place 2 sprays into both nostrils daily. 16 g 6   irbesartan  (AVAPRO ) 300 MG tablet Take 1 tablet by mouth once daily 90 tablet 0   loratadine  (CLARITIN ) 10 MG tablet Take 1 tablet (10 mg total) by mouth daily. 30 tablet 11   Multiple Vitamin (MULTIVITAMIN) tablet Take 1 tablet by mouth daily.     Omega-3 Fatty Acids (FISH OIL) 1200 MG CAPS Take 1,200 mg by mouth in the morning and at bedtime.     pregabalin  (LYRICA ) 200 MG capsule Take 1 capsule (200 mg total) by mouth at bedtime. 90 capsule 3   Saline (SIMPLY SALINE) 0.9 % AERS Place 2 each into the nose as directed. Use nightly for sinus hygiene long-term.  Can also be used as many times daily as desired to assist with clearing congested sinuses. 127 mL 11   Spacer/Aero-Holding Chambers (AEROCHAMBER MV) inhaler Use as directed with albuterol  inhaler. 1 each 0   TURMERIC PO Take 1,000 mg by mouth in the morning and at bedtime.     venlafaxine  (EFFEXOR ) 100 MG tablet Take 1 tablet (100 mg total) by mouth daily. 90 tablet 3   vitamin B-12 (CYANOCOBALAMIN) 500 MCG tablet Take 1 tablet by mouth daily.     zolpidem  (AMBIEN ) 10 MG tablet Take 1 tablet (10 mg total) by mouth at bedtime as needed for sleep. Can NOT take with opioid pain medicine, alcohol, or other sedative.  Risk dementia/mornings impaired alertness 90 tablet 1   albuterol  (PROVENTIL ) (2.5 MG/3ML) 0.083% nebulizer solution Take 3 mLs (2.5 mg total) by nebulization every 6 (six) hours as needed for wheezing or shortness of breath. 150 mL 1    calcium  carbonate (SUPER CALCIUM ) 1500 (600 Ca) MG TABS tablet once. (Patient not taking: Reported on 08/04/2023)     Cholecalciferol (VITAMIN D PO) Take 1 tablet by mouth daily. (Patient not taking: Reported on 08/04/2023)     ketorolac  (ACULAR ) 0.4 % SOLN Place 1 drop into both eyes 4 (four) times daily. 5 mL 11   pseudoephedrine  (SUDAFED 12 HOUR) 120 MG 12 hr tablet Take 1 tablet (120 mg total) by mouth 2 (two) times daily. (Patient not taking: Reported on 08/04/2023) 20 tablet 0   Respiratory Therapy Supplies MISC Filters Hoses tubing masks headgear cleaning supplies for dreamwear and  philips respironics bipap machine. 2 each 0   Rimegepant Sulfate (NURTEC) 75 MG TBDP Take 1 tablet (75 mg total) by  mouth daily as needed. 9 tablet 2   sildenafil (REVATIO) 20 MG tablet 1 tablet Orally Once a day (Patient not taking: Reported on 10/17/2023)     No current facility-administered medications for this visit.    Allergies-reviewed and updated Allergies  Allergen Reactions   Shellfish Allergy Rash, Shortness Of Breath and Swelling   Shellfish-Derived Products Shortness Of Breath    Social History   Socioeconomic History   Marital status: Married    Spouse name: Not on file   Number of children: 3   Years of education: 12   Highest education level: 12th grade  Occupational History   Occupation: Retired  Tobacco Use   Smoking status: Never   Smokeless tobacco: Never  Vaping Use   Vaping status: Never Used  Substance and Sexual Activity   Alcohol use: Yes    Alcohol/week: 10.0 standard drinks of alcohol    Types: 10 Standard drinks or equivalent per week    Comment: Varies sometimes not at all   Drug use: No   Sexual activity: Not Currently  Other Topics Concern   Not on file  Social History Narrative   Right handed   Caffeine none   Lives in one story home   Retired    Lives with wife   Social Drivers of Corporate investment banker Strain: Low Risk  (10/14/2023)   Overall  Financial Resource Strain (CARDIA)    Difficulty of Paying Living Expenses: Not hard at all  Food Insecurity: No Food Insecurity (10/14/2023)   Hunger Vital Sign    Worried About Running Out of Food in the Last Year: Never true    Ran Out of Food in the Last Year: Never true  Transportation Needs: No Transportation Needs (10/14/2023)   PRAPARE - Administrator, Civil Service (Medical): No    Lack of Transportation (Non-Medical): No  Physical Activity: Insufficiently Active (10/14/2023)   Exercise Vital Sign    Days of Exercise per Week: 4 days    Minutes of Exercise per Session: 30 min  Stress: Stress Concern Present (10/14/2023)   [employer] of Occupational Health - Occupational Stress Questionnaire    Feeling of Stress: To some extent  Social Connections: Socially Integrated (10/14/2023)   Social Connection and Isolation Panel    Frequency of Communication with Friends and Family: Twice a week    Frequency of Social Gatherings with Friends and Family: Once a week    Attends Religious Services: 1 to 4 times per year    Active Member of [employer] or Organizations: Yes    Attends Banker Meetings: 1 to 4 times per year    Marital Status: Married    Curator of Surgeons Risk Strat: Procedure: [id number] - Arthroplasty, knee, condyle and plateau; medial AND lateral compartments with or without patella resurfacing (total knee arthroplasty) Risk Factors: Age (28), Male, HTN, BMI (27.32) Note: Your Risk has been rounded to one decimal point. Outcomes  Your Risk Average Risk Chance of Outcome [id number]%Serious Complication1.4%1.9%Below [id number]%Any Complication2.1%2.6%Below [id number]%Pneumonia0.1%0.1%Below [id number]%Cardiac Complication0.1%0.2%Below [id number]%Venous Thromboembolism0.8%0.8%[id number]%Sepsis0.1%0.2%Below [id number]%Surgical Site  Infection1.2%1.5%Below [id number]%Urinary Tract Infection0.4%0.6%Below [id number]%Renal Failure0.0%0.1%Below [id number]%Readmission1.6%2.2%Below [id number]%Return to OR0.6%0.8%Below [id number]%Death0.0%0.0%Below [id number]%Discharge to Nursing or Rehab Facility1.3%3.8%Below Average Predicted Length of Hospital Stay: 1 day   Appropriate Potential Non-operative Treatment Options Are Available and Should Be Discussed         Objective:  Physical Exam:  I. General Appearance:      General: Well-developed, well-nourished, in no acute distress.  Level of Consciousness: Alert and oriented to person, place, and time.     BP 120/64   Pulse 80   Temp 98 F (36.7 C) (Temporal)   Ht 5' 9.5 (1.765 m)   Wt 185 lb (83.9 kg)   SpO2 98%   BMI 26.93 kg/m        Wt Readings from Last 3 Encounters:  10/17/23 185 lb (83.9 kg)  08/04/23 187 lb 9.6 oz (85.1 kg)  07/08/23 188 lb (85.3 kg)        BP Readings from Last 3 Encounters:  10/17/23 120/64  08/04/23 120/64  07/08/23 123/67    Clinical Frailty Scale: Well: People who have no severe disease symptoms but are less fit than category 1. They exercise or are very active occasionally, e.g., seasonally.  II. Head, Eyes, Ears, Nose, and Throat (HEENT):      Head: Normocephalic, atraumatic (NCAT).     Eyes:         Pupils: Equal, round, reactive to light and accommodation (PERRLA).         Sclerae: Non-icteric.         Conjunctivae: Pink, no injection.         Extraocular Movements (EOMs): Intact.     Any conditions that might make intubation difficult (e.g., limited neck mobility, prominent overbite): no     Ears: External canals clear, tympanic membranes normal. (If examined)     Nose: Nasal mucosa moist, no discharge.     Throat: Oropharynx moist, no erythema or exudates.  III. Cardiovascular (CV):      Heart Rate and  Rhythm: Regular rate and rhythm.  Pulse Readings from Last 1 Encounters:  10/17/23 80       Heart Sounds: No murmurs, rubs, or gallops. S1 and S2 present.   IV. Respiratory:      Lungs: Clear to auscultation bilaterally. No wheezes, rales (crackles), or rhonchi.   Faint end-expiratory wheezing    SpO2 Readings from Last 1 Encounters:  10/17/23 98%        Respiratory Effort:  No use of accessory muscles, no retractions .Normal chest wall movement .  V. Abdomen:      Abdomen: Soft, non-tender, non-distended.     No guarding, rigidity, or rebound tenderness.  VI. Musculoskeletal:      Extremities: No edema, cyanosis, or clubbing.     Skin: Warm, dry, intact. No rashes, lesions, or ulcers of concern near planned surgical site. Wearing brace on the knee    VII. Neurologic (Neuro):      Mental Status: Alert and oriented      Cranial Nerves: Grossly intact.     Motor:  No focal or generalized weakness apparent     Gait: (If assessed) Steady gait.  VIII. Psychiatric (Psych):      Mood and Affect: Appropriate.     Thought Process and Content: Normal. Demonstrates understanding.    EKG, echocardiogram, CT coronary calcium  score from 01/2023 all reviewed with patient.     Care coordination: A copy of this note will be forwarded to the requesting physician   Attestation Opening (Medical Necessity & Complexity) I personally spent 46 minutes today performing medically necessary, patient-specific care for a 73 year old male requiring preoperative clearance for right total knee arthroplasty in the context of complex comorbidities, including chronic ocular pain post-cataract surgery, coronary artery disease, and multiple chronic medical conditions. The extended time was required due to the need for detailed risk stratification, multidisciplinary care coordination, medication management, and  in-depth counseling to ensure safe perioperative planning and address persistent, refractory  symptoms affecting multiple organ systems.  Activity Breakdown (Each 5-Minute Block Accounted For) Pre-Service Chart Review (7 minutes): Reviewed 18 months of multispecialty records, including ophthalmology, cardiology, and surgical consults. Analyzed recent EKG, echocardiogram, and CT coronary calcium  score (from 01/2023) for updated cardiac risk assessment. Evaluated medication reconciliation, allergy profile, and prior surgical history for anesthesia and perioperative risk. History Taking & Functional Assessment (10 minutes): Conducted a comprehensive, patient-directed interview focusing on persistent ocular pain and photophobia post-cataract surgery, exacerbating factors (fluorescent/LED lights), and unsuccessful prior treatments. Assessed functional status using DASI and METS estimation, confirming moderate functional capacity (4-10 METs). Elicited detailed history of right knee pain, impact on ADLs, and prior nonoperative management. Physical Examination (8 minutes): Performed complete preoperative physical exam, including frailty scale, HEENT (focused on ocular findings), cardiopulmonary, abdominal, musculoskeletal (with brace assessment), and neurologic evaluation. Assessed for surgical site integrity and any findings that may complicate anesthesia or postoperative recovery. Risk Stratification & Medical Decision-Making (11 minutes): Applied validated perioperative risk calculators (ACS NSQIP, RCRI, MICA, DASI) and interpreted results in context of patient's comorbidities (CAD, OSA, CKD, asthma, multiple prior cancers). Stratified cardiac risk as low/intermediate, reviewed guideline-based recommendations (2022 ACC/AHA), and determined no further cardiac testing was indicated. Calculated individualized risk percentages for complications and explained these to the patient. Medication Management & Reconciliation (4 minutes): Evaluated appropriateness and perioperative management of  antihypertensives, statins, aspirin , and high-risk drugs (pregabalin , venlafaxine ). Provided explicit instructions for holding/continuing medications preoperatively in accordance with current guidelines. Assessed for drug interactions and confirmed patient understanding. Patient/Family Counseling & Shared Decision-Making (5 minutes): Engaged in detailed, patient-centered discussion using teach-back methods to ensure understanding of surgical risks, alternatives, and perioperative plan. Reviewed ACS NSQIP and RCRI results, empowered patient autonomy, and documented patient's rationale for proceeding with surgery. Addressed patient's emotional response to chronic pain and surgical anxiety. Care Coordination & Documentation (1 minute): Forwarded consult note to referring provider, documented all findings, recommendations, and patient preferences in the EHR. Ensured that a copy would be shared with the patient via MyChart and that all perioperative instructions were clearly communicated.  Complexity Narrative  This patient presented for surgical clearance in the setting of severe right knee osteoarthritis and a complex medical history, including chronic, refractory ocular pain post-cataract surgery, coronary artery disease (elevated calcium  score), OSA, and multiple prior malignancies. The encounter required integration of multisystem risk factors, reconciliation of a complex medication regimen, and application of multiple perioperative risk tools. The patient's persistent, function-limiting pain and photophobia necessitated time-intensive evaluation and counseling regarding both surgical and non-surgical options. Additional complexity arose from the need to coordinate care across specialties (ophthalmology, cardiology, anesthesia, pain management), provide nuanced medication instructions, and ensure the patient's understanding and informed consent for a high-stakes procedure.  Medical Necessity Statement The  extended time was medically necessary to ensure a safe perioperative course by thoroughly evaluating cardiac and multisystem risks, optimizing chronic medication regimens, and providing detailed, patient-specific counseling. Without this level of assessment and coordination, the patient would face increased risk of perioperative complications, suboptimal pain control, and potential adverse drug events.

## 2023-10-18 NOTE — Progress Notes (Signed)
 Sent over

## 2023-10-20 DIAGNOSIS — M1711 Unilateral primary osteoarthritis, right knee: Secondary | ICD-10-CM | POA: Diagnosis not present

## 2023-10-20 DIAGNOSIS — R7303 Prediabetes: Secondary | ICD-10-CM | POA: Diagnosis not present

## 2023-10-20 DIAGNOSIS — M25561 Pain in right knee: Secondary | ICD-10-CM | POA: Diagnosis not present

## 2023-10-31 ENCOUNTER — Ambulatory Visit: Admitting: Internal Medicine

## 2023-10-31 DIAGNOSIS — M25561 Pain in right knee: Secondary | ICD-10-CM | POA: Diagnosis not present

## 2023-11-01 DIAGNOSIS — M1711 Unilateral primary osteoarthritis, right knee: Secondary | ICD-10-CM | POA: Diagnosis not present

## 2023-11-02 DIAGNOSIS — M9903 Segmental and somatic dysfunction of lumbar region: Secondary | ICD-10-CM | POA: Diagnosis not present

## 2023-11-04 ENCOUNTER — Encounter: Payer: Self-pay | Admitting: Internal Medicine

## 2023-11-04 ENCOUNTER — Ambulatory Visit: Admitting: Internal Medicine

## 2023-11-04 VITALS — BP 132/70 | HR 70 | Temp 98.0°F | Ht 69.5 in | Wt 182.8 lb

## 2023-11-04 DIAGNOSIS — H5713 Ocular pain, bilateral: Secondary | ICD-10-CM | POA: Diagnosis not present

## 2023-11-04 DIAGNOSIS — G43809 Other migraine, not intractable, without status migrainosus: Secondary | ICD-10-CM | POA: Diagnosis not present

## 2023-11-04 DIAGNOSIS — H04123 Dry eye syndrome of bilateral lacrimal glands: Secondary | ICD-10-CM | POA: Diagnosis not present

## 2023-11-04 MED ORDER — BUTALBITAL-APAP-CAFFEINE 50-325-40 MG PO TABS
1.0000 | ORAL_TABLET | Freq: Four times a day (QID) | ORAL | 0 refills | Status: AC | PRN
Start: 2023-11-04 — End: ?

## 2023-11-04 MED ORDER — RESTASIS 0.05 % OP EMUL: 1.0000 [drp] | Freq: Two times a day (BID) | OPHTHALMIC | 1 refills | Status: DC

## 2023-11-04 NOTE — Progress Notes (Signed)
 ==============================  [facility]: [phone]   -- Medical Office Visit --  Patient: Jay Fox      Age: 73 y.o.       Sex:  male  Date:   11/04/2023 Today's Healthcare Provider: Bernardino KANDICE Cone, MD  ==============================   Chief Complaint: Eye Pain (Still having eye trouble )   Discussed the use of AI scribe software for clinical note transcription with the patient, who gave verbal consent to proceed.  History of Present Illness 73 year old male with a history of cataract surgery who presents with severe photosensitivity and eye pain.  He has been experiencing severe and progressive photosensitivity and eye pain, worsening over the past few weeks. The pain is described as intractable and intensifies as the day progresses, often leading to elevated blood pressure, such as a reading of 167/78 mmHg during a particularly bad episode on Wednesday. These symptoms have been present for years following cataract surgery, as reported by the patient.  He has tried various eye drops without relief, including ketorolac  and bromfenac , which caused burning. Heated masks provide some relief by forcing him to keep his eyes closed, thus reducing exposure to light. He also uses an ointment at night for soothing effects but has not found any eye drops that provide significant relief.  He experiences dry eyes daily, with symptoms of tearing and redness, but no vision loss or corneal abrasions have been reported. He uses Retain eye drops as recommended by a previous eye doctor. He has consulted three different eye doctors but has not found relief from his symptoms.  He is currently taking pregabalin  for restless legs, which does not seem to affect his eye pain. He is also on venlafaxine , prescribed by a neurologist for migraines, but reports no improvement in symptoms. He experiences migraines, which he believes are triggered by the eye pain, and  describes a particularly severe migraine episode on Wednesday.  He is scheduled for major knee surgery on August 28th, which may impact his ability to seek immediate follow-up care for his eye condition.  Background Reviewed: Problem List: has Asthma; Hypertension; Seasonal allergies; Urinary incontinence; Skin lesions, generalized; Prostate cancer (HCC); Insomnia; Heart palpitations; Benign essential hypertension; Asthma, moderate persistent; History of renal cell carcinoma; Personal history of prostate cancer; History of radical prostatectomy; Leg swelling; Restless leg syndrome; Prediabetes; OSA (obstructive sleep apnea); Back wound; Generalized abdominal pain; High risk medication use; Abdominal distension; Altered olfactory perception; Jugular venous distension; Cervical lymphadenopathy; Hyperlipidemia; and Ocular pain, bilateral on their problem list. Past Medical History:  has a past medical history of Allergy (1953), Altered olfactory perception (05/31/2022), Anxiety, Arthritis, Asthma, BPH (benign prostatic hypertrophy), BPH (benign prostatic hypertrophy), Cancer (HCC), Cataract, Chronic kidney disease, Depression, GERD (gastroesophageal reflux disease), History of kidney cancer, History of prostate cancer (11/30/2021), Hyperlipidemia (12/08/2022), Hypertension, Leg swelling (11/30/2021), OSA (obstructive sleep apnea) (11/30/2021), Restless legs (11/30/2021), Seasonal allergies, and Sleep apnea. Past Surgical History:   has a past surgical history that includes Tonsillectomy; Renal cryoablation; Nasal sinus surgery (2019); and Cataract extraction, bilateral (Bilateral, 11/2022). Social History:   reports that he has never smoked. He has never used smokeless tobacco. He reports current alcohol use of about 10.0 standard drinks of alcohol per week. He reports that he does not use drugs. Family History:  family history includes Anxiety disorder in his mother; Cancer in his father, mother, paternal  grandfather, and paternal grandmother; Colon cancer in his paternal grandfather and paternal grandmother; Emphysema  in his mother; Hypertension in his father and mother; Liver cancer in his mother; Multiple myeloma in his father; Pancreatic cancer in his mother. Allergies:  is allergic to shellfish allergy and shellfish-derived products.   Medication Reconciliation: Current Outpatient Medications on File Prior to Visit  Medication Sig   albuterol  (VENTOLIN  HFA) 108 (90 Base) MCG/ACT inhaler Inhale 2 puffs into the lungs every 6 (six) hours as needed for wheezing or shortness of breath.   amLODipine  (NORVASC ) 10 MG tablet Take 1 tablet by mouth daily   Ascorbic Acid (VITAMIN C PO) Take 1 tablet by mouth once. 500mg    aspirin  EC 81 MG tablet Take 1 tablet (81 mg total) by mouth daily. Swallow whole.   Bromfenac  Sodium 0.07 % SOLN Apply 2 drops to eye daily as needed.   budesonide -formoterol  (BREYNA ) 160-4.5 MCG/ACT inhaler Inhale 2 puffs into the lungs 2 (two) times daily.   calcium  carbonate (TUMS - DOSED IN MG ELEMENTAL CALCIUM ) 500 MG chewable tablet Chew 1 tablet by mouth as needed.   Calcium  Carbonate-Vit D-Min (CALCIUM  1200 PO) Take 1 tablet by mouth daily.   Cholecalciferol (VITAMIN D3) 50 MCG (2000 UT) TABS Take 1 tablet by mouth daily.   citalopram  (CELEXA ) 10 MG tablet Take 10 mg by mouth daily.   diclofenac Sodium (VOLTAREN) 1 % GEL Place onto the skin.   erythromycin  ophthalmic ointment Place 1 Application into both eyes at bedtime.   fluticasone  (FLONASE ) 50 MCG/ACT nasal spray Place 2 sprays into both nostrils daily.   irbesartan  (AVAPRO ) 300 MG tablet Take 1 tablet by mouth once daily   loratadine  (CLARITIN ) 10 MG tablet Take 1 tablet (10 mg total) by mouth daily.   Multiple Vitamin (MULTIVITAMIN) tablet Take 1 tablet by mouth daily.   Omega-3 Fatty Acids (FISH OIL) 1200 MG CAPS Take 1,200 mg by mouth in the morning and at bedtime.   pregabalin  (LYRICA ) 200 MG capsule Take 1  capsule (200 mg total) by mouth at bedtime.   Respiratory Therapy Supplies MISC Filters Hoses tubing masks headgear cleaning supplies for dreamwear and  philips respironics bipap machine.   rosuvastatin  (CRESTOR ) 20 MG tablet Take 1 tablet (20 mg total) by mouth daily.   Saline (SIMPLY SALINE) 0.9 % AERS Place 2 each into the nose as directed. Use nightly for sinus hygiene long-term.  Can also be used as many times daily as desired to assist with clearing congested sinuses.   sildenafil (REVATIO) 20 MG tablet 1 tablet Orally Once a day (Patient not taking: Reported on 10/17/2023)   Spacer/Aero-Holding Chambers (AEROCHAMBER MV) inhaler Use as directed with albuterol  inhaler.   TURMERIC PO Take 1,000 mg by mouth in the morning and at bedtime.   venlafaxine  (EFFEXOR ) 100 MG tablet Take 1 tablet (100 mg total) by mouth daily.   vitamin B-12 (CYANOCOBALAMIN) 500 MCG tablet Take 1 tablet by mouth daily.   zolpidem  (AMBIEN ) 10 MG tablet Take 1 tablet (10 mg total) by mouth at bedtime as needed for sleep. Can NOT take with opioid pain medicine, alcohol, or other sedative.  Risk dementia/mornings impaired alertness   No current facility-administered medications on file prior to visit.  There are no discontinued medications.   Physical Exam:    11/04/2023    1:12 PM 10/17/2023    1:37 PM 08/04/2023    1:09 PM  Vitals with BMI  Height 5' 9.5 5' 9.5 5' 9.5  Weight 182 lbs 13 oz 185 lbs 187 lbs 10 oz  BMI 26.62 26.94  27.32  Systolic 132 120 879  Diastolic 70 64 64  Pulse 70 80 76  Vital signs reviewed.  Nursing notes reviewed. Weight trend reviewed. Physical Activity: Insufficiently Active (10/14/2023)   Exercise Vital Sign    Days of Exercise per Week: 4 days    Minutes of Exercise per Session: 30 min   General Appearance:  No acute distress appreciable.   Well-groomed, healthy-appearing male.  Well proportioned with no abnormal fat distribution.  Good muscle tone. Pulmonary:  Normal work of  breathing at rest, no respiratory distress apparent. SpO2: 98 %  Musculoskeletal: All extremities are intact.  Neurological:  Awake, alert, oriented, and engaged.  No obvious focal neurological deficits or cognitive impairments.  Sensorium seems unclouded.   Speech is clear and coherent with logical content. Psychiatric:  Appropriate mood, pleasant and cooperative demeanor, thoughtful and engaged during the exam   Verbalized to patient: Physical Exam    Results:    08/04/2023    1:16 PM 01/18/2023   11:16 AM 12/28/2022   10:11 AM 12/08/2022   10:00 AM  PHQ 2/9 Scores  PHQ - 2 Score 1 4 1  0  PHQ- 9 Score 1 11 8 6     Verbalized to patient: Results     No results found for any visits on 11/04/23. Office Visit on 04/28/2023  Component Date Value Ref Range Status   Iron 04/28/2023 104  50 - 180 mcg/dL Final   TIBC 97/86/7974 304  250 - 425 mcg/dL (calc) Final   %SAT 97/86/7974 34  20 - 48 % (calc) Final   Ferritin 04/28/2023 168  24 - 380 ng/mL Final  Office Visit on 02/03/2023  Component Date Value Ref Range Status   S' Lateral 02/23/2023 2.95  cm Final   Area-P 1/2 02/23/2023 3.03  cm2 Final   P 1/2 time 02/23/2023 935  msec Final   Est EF 02/23/2023 60 - 65%   Final   Sed Rate 02/03/2023 9  0 - 20 mm/h Final   CRP 02/03/2023 <3.0  <8.0 mg/L Final  Clinical Support on 12/28/2022  Component Date Value Ref Range Status   Cologuard 06/07/2022 Negative  Negative Final  Office Visit on 12/08/2022  Component Date Value Ref Range Status   WBC 12/08/2022 6.0  4.0 - 10.5 K/uL Final   RBC 12/08/2022 4.89  4.22 - 5.81 Mil/uL Final   Platelets 12/08/2022 240.0  150.0 - 400.0 K/uL Final   Hemoglobin 12/08/2022 14.2  13.0 - 17.0 g/dL Final   HCT 90/74/7975 42.9  39.0 - 52.0 % Final   MCV 12/08/2022 87.8  78.0 - 100.0 fl Final   MCHC 12/08/2022 33.1  30.0 - 36.0 g/dL Final   RDW 90/74/7975 14.1  11.5 - 15.5 % Final   Sodium 12/08/2022 141  135 - 145 mEq/L Final   Potassium  12/08/2022 4.1  3.5 - 5.1 mEq/L Final   Chloride 12/08/2022 105  96 - 112 mEq/L Final   CO2 12/08/2022 27  19 - 32 mEq/L Final   Glucose, Bld 12/08/2022 109 (H)  70 - 99 mg/dL Final   BUN 90/74/7975 17  6 - 23 mg/dL Final   Creatinine, Ser 12/08/2022 1.02  0.40 - 1.50 mg/dL Final   Total Bilirubin 12/08/2022 0.7  0.2 - 1.2 mg/dL Final   Alkaline Phosphatase 12/08/2022 67  39 - 117 U/L Final   AST 12/08/2022 18  0 - 37 U/L Final   ALT 12/08/2022 21  0 - 53 U/L  Final   Total Protein 12/08/2022 7.1  6.0 - 8.3 g/dL Final   Albumin 90/74/7975 4.5  3.5 - 5.2 g/dL Final   GFR 90/74/7975 73.99  >60.00 mL/min Final   Calcium  12/08/2022 9.9  8.4 - 10.5 mg/dL Final   Cholesterol 90/74/7975 134  0 - 200 mg/dL Final   Triglycerides 90/74/7975 227.0 (H)  0.0 - 149.0 mg/dL Final   HDL 90/74/7975 50.90  >39.00 mg/dL Final   VLDL 90/74/7975 45.4 (H)  0.0 - 40.0 mg/dL Final   LDL Cholesterol 12/08/2022 38  0 - 99 mg/dL Final   Total CHOL/HDL Ratio 12/08/2022 3   Final   NonHDL 12/08/2022 83.39   Final   TSH 12/08/2022 2.28  0.35 - 5.50 uIU/mL Final   Hgb A1c MFr Bld 12/08/2022 5.9  4.6 - 6.5 % Final  Office Visit on 05/31/2022  Component Date Value Ref Range Status   COLOGUARD 06/07/2022 Negative  Negative Final  Orders Only on 04/07/2022  Component Date Value Ref Range Status   Total Protein, Urine 04/07/2022 7.9  Not Estab. mg/dL Final   Total Protein, Urine-Ur/day 04/07/2022 122  30 - 150 mg/24 hr Final   Albumin, U 04/07/2022 50.3  % Final   ALPHA 1 URINE 04/07/2022 9.4  % Final   Alpha 2, Urine 04/07/2022 11.9  % Final   % BETA, Urine 04/07/2022 23.1  % Final   GAMMA GLOBULIN URINE 04/07/2022 5.4  % Final   Free Kappa Lt Chains,Ur 04/07/2022 12.36  1.17 - 86.46 mg/L Final   Free Lambda Lt Chains,Ur 04/07/2022 1.76  0.27 - 15.21 mg/L Corrected   Free Kappa/Lambda Ratio 04/07/2022 7.02  1.83 - 14.26 Corrected   Immunofixation Result, Urine 04/07/2022 Comment   Corrected   Total Volume  04/07/2022 1,550   Final   M-SPIKE %, Urine 04/07/2022 Not Observed  Not Observed % Corrected   Note: 04/07/2022 Comment   Corrected  Appointment on 04/05/2022  Component Date Value Ref Range Status   LDH 04/05/2022 131  98 - 192 U/L Final   Beta-2  Microglobulin 04/05/2022 1.5  0.6 - 2.4 mg/L Final   Kappa free light chain 04/05/2022 18.5  3.3 - 19.4 mg/L Final   Lambda free light chains 04/05/2022 14.1  5.7 - 26.3 mg/L Final   Kappa, lambda light chain ratio 04/05/2022 1.31  0.26 - 1.65 Final   IgG (Immunoglobin G), Serum 04/05/2022 787  603 - 1,613 mg/dL Final   IgA 98/77/7975 149  61 - 437 mg/dL Final   IgM (Immunoglobulin M), Srm 04/05/2022 88  20 - 172 mg/dL Final   Total Protein ELP 04/05/2022 6.6  6.0 - 8.5 g/dL Corrected   Albumin SerPl Elph-Mcnc 04/05/2022 3.8  2.9 - 4.4 g/dL Corrected   Alpha 1 98/77/7975 0.2  0.0 - 0.4 g/dL Corrected   Alpha2 Glob SerPl Elph-Mcnc 04/05/2022 0.7  0.4 - 1.0 g/dL Corrected   B-Globulin SerPl Elph-Mcnc 04/05/2022 1.0  0.7 - 1.3 g/dL Corrected   Gamma Glob SerPl Elph-Mcnc 04/05/2022 0.9  0.4 - 1.8 g/dL Corrected   M Protein SerPl Elph-Mcnc 04/05/2022 Not Observed  Not Observed g/dL Corrected   Globulin, Total 04/05/2022 2.8  2.2 - 3.9 g/dL Corrected   Albumin/Glob SerPl 04/05/2022 1.4  0.7 - 1.7 Corrected   IFE 1 04/05/2022 Comment   Corrected   Please Note 04/05/2022 Comment   Corrected   Sodium 04/05/2022 140  135 - 145 mmol/L Final   Potassium 04/05/2022 4.2  3.5 - 5.1  mmol/L Final   Chloride 04/05/2022 106  98 - 111 mmol/L Final   CO2 04/05/2022 28  22 - 32 mmol/L Final   Glucose, Bld 04/05/2022 110 (H)  70 - 99 mg/dL Final   BUN 98/77/7975 19  8 - 23 mg/dL Final   Creatinine 98/77/7975 0.97  0.61 - 1.24 mg/dL Final   Calcium  04/05/2022 10.2  8.9 - 10.3 mg/dL Final   Total Protein 98/77/7975 7.0  6.5 - 8.1 g/dL Final   Albumin 98/77/7975 4.4  3.5 - 5.0 g/dL Final   AST 98/77/7975 15  15 - 41 U/L Final   ALT 04/05/2022 20  0 - 44 U/L Final    Alkaline Phosphatase 04/05/2022 69  38 - 126 U/L Final   Total Bilirubin 04/05/2022 0.5  0.3 - 1.2 mg/dL Final   GFR, Estimated 04/05/2022 >60  >60 mL/min Final   Anion gap 04/05/2022 6  5 - 15 Final   WBC Count 04/05/2022 5.8  4.0 - 10.5 K/uL Final   RBC 04/05/2022 4.96  4.22 - 5.81 MIL/uL Final   Hemoglobin 04/05/2022 14.7  13.0 - 17.0 g/dL Final   HCT 98/77/7975 43.7  39.0 - 52.0 % Final   MCV 04/05/2022 88.1  80.0 - 100.0 fL Final   MCH 04/05/2022 29.6  26.0 - 34.0 pg Final   MCHC 04/05/2022 33.6  30.0 - 36.0 g/dL Final   RDW 98/77/7975 13.1  11.5 - 15.5 % Final   Platelet Count 04/05/2022 220  150 - 400 K/uL Final   nRBC 04/05/2022 0.0  0.0 - 0.2 % Final   Neutrophils Relative % 04/05/2022 62  % Final   Neutro Abs 04/05/2022 3.6  1.7 - 7.7 K/uL Final   Lymphocytes Relative 04/05/2022 20  % Final   Lymphs Abs 04/05/2022 1.1  0.7 - 4.0 K/uL Final   Monocytes Relative 04/05/2022 11  % Final   Monocytes Absolute 04/05/2022 0.6  0.1 - 1.0 K/uL Final   Eosinophils Relative 04/05/2022 6  % Final   Eosinophils Absolute 04/05/2022 0.4  0.0 - 0.5 K/uL Final   Basophils Relative 04/05/2022 1  % Final   Basophils Absolute 04/05/2022 0.1  0.0 - 0.1 K/uL Final   Immature Granulocytes 04/05/2022 0  % Final   Abs Immature Granulocytes 04/05/2022 0.02  0.00 - 0.07 K/uL Final  Office Visit on 03/29/2022  Component Date Value Ref Range Status   Amylase 03/29/2022 50  27 - 131 U/L Final   Lipase 03/29/2022 20.0  11.0 - 59.0 U/L Final   PSA 03/29/2022 0.00 Repeated and verified X2. (L)  0.10 - 4.00 ng/mL Final   Sodium 03/29/2022 143  135 - 145 mEq/L Final   Potassium 03/29/2022 4.7  3.5 - 5.1 mEq/L Final   Chloride 03/29/2022 105  96 - 112 mEq/L Final   CO2 03/29/2022 30  19 - 32 mEq/L Final   Glucose, Bld 03/29/2022 111 (H)  70 - 99 mg/dL Final   BUN 98/84/7975 20  6 - 23 mg/dL Final   Creatinine, Ser 03/29/2022 0.98  0.40 - 1.50 mg/dL Final   Total Bilirubin 03/29/2022 0.5  0.2 - 1.2  mg/dL Final   Alkaline Phosphatase 03/29/2022 76  39 - 117 U/L Final   AST 03/29/2022 15  0 - 37 U/L Final   ALT 03/29/2022 19  0 - 53 U/L Final   Total Protein 03/29/2022 6.8  6.0 - 8.3 g/dL Final   Albumin 98/84/7975 4.5  3.5 - 5.2 g/dL  Final   GFR 03/29/2022 78.01  >60.00 mL/min Final   Calcium  03/29/2022 10.1  8.4 - 10.5 mg/dL Final   WBC 98/84/7975 6.6  4.0 - 10.5 K/uL Final   RBC 03/29/2022 4.89  4.22 - 5.81 Mil/uL Final   Platelets 03/29/2022 226.0  150.0 - 400.0 K/uL Final   Hemoglobin 03/29/2022 14.5  13.0 - 17.0 g/dL Final   HCT 98/84/7975 43.3  39.0 - 52.0 % Final   MCV 03/29/2022 88.6  78.0 - 100.0 fl Final   MCHC 03/29/2022 33.4  30.0 - 36.0 g/dL Final   RDW 98/84/7975 13.6  11.5 - 15.5 % Final  Lab on 03/03/2022  Component Date Value Ref Range Status   Immunofix Electr Int 03/03/2022    Final   Immunoglobulin A 03/03/2022 151  70 - 320 mg/dL Final   IgG (Immunoglobin G), Serum 03/03/2022 836  600 - 1,540 mg/dL Final   IgM, Serum 87/79/7976 92  50 - 300 mg/dL Final  Office Visit on 12/14/2021  Component Date Value Ref Range Status   Alcohol Metabolites 12/14/2021 NEGATIVE  <500 ng/mL Final   Amphetamines 12/14/2021 NEGATIVE  <500 ng/mL Final   Benzodiazepines 12/14/2021 NEGATIVE  <100 ng/mL Final   Buprenorphine, Urine 12/14/2021 NEGATIVE  <5 ng/mL Final   Cocaine Metabolite 12/14/2021 NEGATIVE  <150 ng/mL Final   6 Acetylmorphine 12/14/2021 NEGATIVE  <10 ng/mL Final   Marijuana Metabolite 12/14/2021 NEGATIVE  <20 ng/mL Final   MDMA 12/14/2021 NEGATIVE  <500 ng/mL Final   Opiates 12/14/2021 NEGATIVE  <100 ng/mL Final   Oxycodone 12/14/2021 NEGATIVE  <100 ng/mL Final   Creatinine 12/14/2021 129.6  > or = 20.0 mg/dL Final   pH 89/97/7976 5.1  4.5 - 9.0 Final   Oxidant 12/14/2021 NEGATIVE  <200 mcg/mL Final   Notes and Comments 12/14/2021    Final  There may be more visits with results that are not included.  No image results found. No results found.        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 The differential diagnosis includes recurrent corneal erosion and pseudophakic dysphotopsia. Current treatments, such as ketorolac  drops, have been ineffective, and symptoms are exacerbated by dry air and screens. Warm masks provide temporary relief by keeping his eyes closed and moist. Pregabalin , used for restless legs, may also help with nerve pain. Restasis  is proposed to improve tear production and reduce inflammation. He has been referred to Dr. Corbin, a cataract specialist, as the condition is likely a cataract complication. Expedite the referral to Dr. Corbin. Prescribe Restasis  eye drops to improve tear production and reduce inflammation. Discontinue Bromfenac  due to a burning sensation. Continue using warm masks for symptomatic relief and ensure consistent use of eye drops to manage dry eyes.  Migraine   Migraines are likely triggered by severe ocular pain and photosensitivity. Previous treatment with venlafaxine  was ineffective. Fioricet is proposed for acute migraine management. Coordination with neurologist Dr. Leigh is recommended for ongoing migraine management. Advise discontinuation of venlafaxine  after consulting with Dr. Leigh. Prescribe Fioricet for acute migraine management and coordinate with Dr. Leigh for ongoing migraine management.   ORDER ASSOCIATIONS  #   DIAGNOSIS / CONDITION ICD-10 ENCOUNTER ORDER  ICD-10-CM   1. Ocular pain, bilateral  H57.13 Ambulatory referral to Ophthalmology    cycloSPORINE  (RESTASIS ) 0.05 % ophthalmic emulsion    CANCELED:  Ambulatory referral to Ophthalmology    2. Other migraine without status migrainosus, not intractable  G43.809 butalbital -acetaminophen-caffeine  (FIORICET) 50-325-40 MG tablet    3. Dry eyes  H04.123            Orders Placed in Encounter:    Meds ordered this encounter  Medications   cycloSPORINE  (RESTASIS ) 0.05 % ophthalmic emulsion    Sig: Place 1 drop into both eyes 2 (two) times daily.    Dispense:  30 each    Refill:  1   butalbital -acetaminophen-caffeine  (FIORICET) 50-325-40 MG tablet    Sig: Take 1 tablet by mouth every 6 (six) hours as needed for headache.    Dispense:  14 tablet    Refill:  0    Orders Placed This Encounter  Procedures   Ambulatory referral to Ophthalmology    Referral Priority:   Urgent    Referral Type:   Consultation    Referral Reason:   Specialty Services Required    Referred to Provider:   Corbin Mabel NOVAK, MD    Requested Specialty:   Ophthalmology    Number of Visits Requested:   1  Researched and requested cataract complication specialist.  ED Discharge Orders          Ordered    Ambulatory referral to Ophthalmology  Status:  Canceled       Comments: Please expedite make urgent neuroopthalmology referral his photosensitivity is severe intractable progressive worseningAlso he has major knee surgery planned 8/28 so ideally he will be seen before that. We need a specific diagnosis for severe photosensitivity eye pain for years after cataract surgery slowly worsening particularly the photophobia. There is no limbic redness and cataract surgeon reported he can't see anything wrong.  Most Likely Diagnoses: 1. 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 Anterior uveitis typically presents with pain exacerbated by light exposure and can persist or recur for years, especially after intraocular surgery. The American Academy of Ophthalmology notes that anterior  uveitis often manifests with photophobia and pain, and may be associated with perilimbal redness and vision loss.[1] 2. Dry eye disease and ocular surface neuropathic pain are common after cataract surgery and can cause chronic pain and photophobia. The Tear Film and Ocular Surface Society defines dry eye as a multifactorial disease with symptoms including pain and light sensitivity, often out of proportion to clinical findings. Neuropathic pain may persist for years due to abnormal corneal nerve regeneration post-surgery.[2-3] 3. Posterior capsule opacification (PCO) is a frequent late complication of cataract surgery, causing gradual visual decline but typically does not cause severe pain or photosensitivity. The American Academy of Ophthalmology describes PCO as a cause of slowly worsening vision, but pain and photophobia are not classic features.[4] 4. Pseudophakic dysphotopsia (glare from IOL) can cause significant photophobia and visual disturbances, but pain is not a typical feature.[5] 5. Recurrent corneal erosion or corneal epithelial dystrophy may present with chronic pain and photophobia, especially after ocular surgery, but is usually episodic and associated with acute pain on awakening.[6] Most Important Not to Miss Diagnoses: 1. Chronic low-grade postoperative endophthalmitis can present with persistent pain and photophobia, but is usually associated with vision loss and intraocular inflammation. Rule out with slit-lamp exam and intraocular fluid analysis. 2. Angle-closure glaucoma or pupillary block related to IOL  may cause pain and photophobia, but typically presents with acute episodes and elevated intraocular pressure. Gonioscopy and IOP measurement are essential.[7] 3. Herpetic keratouveitis can cause chronic pain, photophobia, and recurrent inflammation. PCR testing of tears and careful slit-lamp examination for dendritic lesions or stromal involvement are indicated.[8] Key Additional  History and Follow-up Tests:  Slit-lamp examination for anterior chamber cells/flare, keratic precipitates, and corneal findings.  Intraocular pressure measurement.  Assessment for posterior capsule opacification and IOL position.  Corneal staining and tear film evaluation.  PCR testing for herpes simplex virus if herpetic keratouveitis is suspected.  Review of systemic symptoms and autoimmune history. The most likely specific diagnosis is chronic anterior uveitis or chronic ocular surface neuropathic pain, but further targeted examination and testing are required to exclude critical sight-threatening etiologies.[1-3]   11/04/23 1342    Ambulatory referral to Ophthalmology       Comments: Please expedite make urgent  subspecialsit cataract optho referral his photosensitivity is severe intractable progressive worseningAlso he has major knee surgery planned 8/28 so ideally he will be seen before that. We need a specific diagnosis for severe photosensitivity eye pain for years after cataract surgery slowly worsening particularly the photophobia. There is no limbic redness and cataract surgeon reported he can't see anything wrong.  Most Likely Diagnoses: 1. 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 Anterior uveitis typically presents with pain exacerbated by light exposure and can persist or recur for years, especially after intraocular surgery. The American Academy of Ophthalmology notes that anterior uveitis often manifests with photophobia and pain, and may be associated with perilimbal redness and vision loss.[1] 2. Dry eye disease and ocular surface neuropathic pain are common after cataract surgery and can cause chronic pain and photophobia. The Tear Film and Ocular Surface Society defines dry eye as a multifactorial disease with symptoms including pain and light sensitivity, often out of proportion to  clinical findings. Neuropathic pain may persist for years due to abnormal corneal nerve regeneration post-surgery.[2-3] 3. Posterior capsule opacification (PCO) is a frequent late complication of cataract surgery, causing gradual visual decline but typically does not cause severe pain or photosensitivity. The American Academy of Ophthalmology describes PCO as a cause of slowly worsening vision, but pain and photophobia are not classic features.[4] 4. Pseudophakic dysphotopsia (glare from IOL) can cause significant photophobia and visual disturbances, but pain is not a typical feature.[5] 5. Recurrent corneal erosion or corneal epithelial dystrophy may present with chronic pain and photophobia, especially after ocular surgery, but is usually episodic and associated with acute pain on awakening.[6] Most Important Not to Miss Diagnoses: 1. Chronic low-grade postoperative endophthalmitis can present with persistent pain and photophobia, but is usually associated with vision loss and intraocular inflammation. Rule out with slit-lamp exam and intraocular fluid analysis. 2. Angle-closure glaucoma or pupillary block related to IOL may cause pain and photophobia, but typically presents with acute episodes and elevated intraocular pressure. Gonioscopy and IOP measurement are essential.[7] 3. Herpetic keratouveitis can cause chronic pain, photophobia, and recurrent inflammation. PCR testing of tears and careful slit-lamp examination for dendritic lesions or stromal involvement are indicated.[8] Key Additional History and Follow-up Tests:  Slit-lamp examination for anterior chamber cells/flare, keratic precipitates, and corneal findings.  Intraocular pressure measurement.  Assessment for posterior capsule opacification and IOL position.  Corneal staining and tear film evaluation.  PCR testing for herpes simplex virus if herpetic keratouveitis is suspected.  Review of systemic symptoms and autoimmune  history.  The most likely specific diagnosis is chronic anterior uveitis or chronic ocular surface neuropathic pain, but further targeted examination and testing are required to exclude critical sight-threatening etiologies.[1-3]   11/04/23 1347    cycloSPORINE  (RESTASIS ) 0.05 % ophthalmic emulsion  2 times daily        11/04/23 1356    butalbital -acetaminophen-caffeine  (FIORICET) 50-325-40 MG tablet  Every 6 hours PRN        11/04/23 1356              This document was synthesized by artificial intelligence ([employer]bridge) using HIPAA-compliant recording of the clinical interaction;   We discussed the use of AI scribe software for clinical note transcription with the patient, who gave verbal consent to proceed. additional Info: This encounter employed state-of-the-art, real-time, collaborative documentation. The patient actively reviewed and assisted in updating their electronic medical record on a shared screen, ensuring transparency and facilitating joint problem-solving for the problem list, overview, and plan. This approach promotes accurate, informed care. The treatment plan was discussed and reviewed in detail, including medication safety, potential side effects, and all patient questions. We confirmed understanding and comfort with the plan. Follow-up instructions were established, including contacting the office for any concerns, returning if symptoms worsen, persist, or new symptoms develop, and precautions for potential emergency department visits.

## 2023-11-05 NOTE — Patient Instructions (Signed)
 It was a pleasure seeing you today! Your health and satisfaction are our top priorities.  Bernardino Cone, MD  VISIT SUMMARY: During your visit, we discussed your severe photosensitivity and eye pain, which have been worsening over the past few weeks. We also reviewed your history of dry eyes and migraines, which you believe are triggered by your eye pain. You have tried various treatments without relief and are scheduled for knee surgery soon, which may affect your ability to follow up immediately.  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 We will expedite your referral to Dr. Corbin, a cataract specialist, to address this complication. You are prescribed Restasis  eye drops to improve tear production and reduce inflammation. Please discontinue Bromfenac  due to the burning sensation it causes. Continue using warm masks for relief and ensure consistent use of eye drops to manage dry eyes.  -MIGRAINE: Migraines are severe headaches that can be triggered by your eye pain and photosensitivity. Since venlafaxine  has been ineffective, we will prescribe Fioricet for acute migraine management. Please consult with Dr. Leigh, your neurologist, about discontinuing venlafaxine  and for ongoing migraine management.  INSTRUCTIONS: Please follow up with Dr. Corbin, the cataract specialist, as soon as possible. Continue using Restasis  eye drops as prescribed and discontinue Bromfenac . Use warm masks for relief and maintain consistent use of eye drops for dry eyes. For migraines, start using Fioricet for acute episodes and consult with Dr. Leigh about discontinuing venlafaxine  and for further migraine management.  Your Providers PCP: Cone [name], MD,  [phone]) Referring Provider: [facility] [name], MD,  [phone]) Care Team Provider: Brien Belvie BRAVO, MD,  [phone]) Care Team Provider: Leigh Venetia CROME, MD,  [phone])  NEXT STEPS: [x]  Early Intervention: Schedule sooner appointment, call our on-call services, or go to emergency room if there is any significant Increase in pain or discomfort New or worsening symptoms Sudden or severe changes in your health [x]  Flexible Follow-Up: We recommend a Return in about 2 weeks (around 11/18/2023). for optimal routine care. This allows for progress monitoring and treatment adjustments. [x]  Preventive Care: Schedule your annual preventive care visit! It's typically covered by insurance and helps identify potential health issues early. [x]  Lab & X-ray Appointments: Incomplete tests scheduled today, or call to schedule. X-rays: [facility] (M-F, 8:30am-noon or 1pm-5pm). [x]  Medical Information Release: Sign a release form at front desk to obtain relevant medical information we don't have.  MAKING THE MOST OF OUR FOCUSED 20 MINUTE APPOINTMENTS: [x]   Clearly state your top concerns at the beginning of the visit to focus our discussion [x]   If you anticipate you will need more time, please inform the front desk during scheduling - we can book multiple appointments in the same week. [x]   If you have transportation problems- use our convenient video appointments or ask about transportation support. [x]   We can get down to business faster if you use MyChart to update information before the visit and submit non-urgent questions before your visit. Thank you for taking the time to provide details through MyChart.  Let our nurse know and she can import this information into your encounter documents.  Arrival and Wait Times: [x]   Arriving on time ensures that everyone receives prompt attention. [x]   Early morning (8a) and afternoon (1p) appointments tend to have shortest wait times. [x]   Unfortunately, we cannot delay appointments  for late arrivals or hold slots during phone  calls.  Getting Answers and Following Up [x]   Simple Questions & Concerns: For quick questions or basic follow-up after your visit, reach us  at [phone] or MyChart messaging. [x]   Complex Concerns: If your concern is more complex, scheduling an appointment might be best. Discuss this with the staff to find the most suitable option. [x]   Lab & Imaging Results: We'll contact you directly if results are abnormal or you don't use MyChart. Most normal results will be on MyChart within 2-3 business days, with a review message from Dr. Jesus. Haven't heard back in 2 weeks? Need results sooner? Contact us  at [phone]. [x]   Referrals: Our referral coordinator will manage specialist referrals. The specialist's office should contact you within 2 weeks to schedule an appointment. Call us  if you haven't heard from them after 2 weeks.  Staying Connected [x]   MyChart: Activate your MyChart for the fastest way to access results and message us . See the last page of this paperwork for instructions on how to activate.  Bring to Your Next Appointment [x]   Medications: Please bring all your medication bottles to your next appointment to ensure we have an accurate record of your prescriptions. [x]   Health Diaries: If you're monitoring any health conditions at home, keeping a diary of your readings can be very helpful for discussions at your next appointment.  Billing [x]   X-ray & Lab Orders: These are billed by separate companies. Contact the invoicing company directly for questions or concerns. [x]   Visit Charges: Discuss any billing inquiries with our administrative services team.  Your Satisfaction Matters [x]   Share Your Experience: We strive for your satisfaction! If you have any complaints, or preferably compliments, please let Dr. Jesus know directly or contact our Practice Administrators, Manuelita Rubin or [employer], by asking at the front desk.   Reviewing Your Records [x]   Review  this early draft of your clinical encounter notes below and the final encounter summary tomorrow on MyChart after its been completed.  All orders placed so far are visible here: Ocular pain, bilateral -     Ambulatory referral to Ophthalmology -     Restasis ; Place 1 drop into both eyes 2 (two) times daily.  Dispense: 30 each; Refill: 1  Other migraine without status migrainosus, not intractable -     Butalbital -APAP-Caffeine ; Take 1 tablet by mouth every 6 (six) hours as needed for headache.  Dispense: 14 tablet; Refill: 0  Dry eyes

## 2023-11-05 NOTE — Assessment & Plan Note (Signed)
 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 The differential diagnosis includes recurrent corneal erosion and pseudophakic dysphotopsia. Current treatments, such as ketorolac  drops, have been ineffective, and symptoms are exacerbated by dry air and screens. Warm masks provide temporary relief by keeping his eyes closed and moist. Pregabalin , used for restless legs, may also help with nerve pain. Restasis  is proposed to improve tear production and reduce inflammation. He has been referred to Dr. Corbin, a cataract specialist, as the condition is likely a cataract complication. Expedite the referral to Dr. Corbin. Prescribe Restasis  eye drops to improve tear production and reduce inflammation. Discontinue Bromfenac  due to a burning sensation. Continue using warm masks for symptomatic relief and ensure consistent use of eye drops to manage dry eyes.  Migraine   Migraines are likely triggered by severe ocular pain and photosensitivity. Previous treatment with venlafaxine  was ineffective. Fioricet is proposed for acute migraine management. Coordination with neurologist Dr. Leigh is recommended for ongoing migraine management. Advise discontinuation of venlafaxine  after consulting with Dr. Leigh. Prescribe Fioricet for acute migraine management and coordinate with Dr. Leigh for ongoing migraine management.

## 2023-11-05 NOTE — Addendum Note (Signed)
 Addended by: Ling Flesch G on: 11/05/2023 10:26 PM   Modules accepted: Level of Service

## 2023-11-07 ENCOUNTER — Telehealth: Payer: Self-pay

## 2023-11-07 NOTE — Telephone Encounter (Signed)
 Copied from CRM #[id number]. Topic: Referral - Request for Referral >> Nov 07, 2023 10:00 AM Rea BROCKS wrote: Did the patient discuss referral with their provider in the last year? Yes (If No - schedule appointment) (If Yes - send message)  Appointment offered? Yes but [facility] is not offering what patient needs specifically.   Type of order/referral and detailed reason for visit: Specialized in cataracts and neuro-ophthalmologist. Patient called back. He is not seeking to schedule with a regular doctor here in clinic. He is seeking to schedule with a neuro-ophthalmologist. Patient did mention that Dr. Jesus was trying to get him in [facility] but there's no help.    Preference of office, provider, location: [facility] is scheduled in April, but he would like to see if he can scheduled with someone sooner, and [facility] doesn't necessarily offer the service he is looking for.   If referral order, have you been seen by this specialty before? Yes (If Yes, this issue or another issue? When? Where?  Can we respond through MyChart? Yes  Please review and advise

## 2023-11-07 NOTE — Telephone Encounter (Signed)
 Copied from CRM #[id number]. Topic: General - Other >> Nov 07, 2023  9:22 AM Aleatha BROCKS wrote: Reason for CRM: Rosina from [facility] and calling to say patient can be seen today from on call physician with [facility], which [facility] should have access to see who is on call,  also if patient still wants to be seen at [facility]  call [phone] ext [phone] but their appointments are a bit backed up  Gave information to pt to call there office back and ask for [employer]

## 2023-11-10 DIAGNOSIS — M25761 Osteophyte, right knee: Secondary | ICD-10-CM | POA: Diagnosis not present

## 2023-11-10 DIAGNOSIS — Z96651 Presence of right artificial knee joint: Secondary | ICD-10-CM | POA: Diagnosis not present

## 2023-11-10 DIAGNOSIS — G8918 Other acute postprocedural pain: Secondary | ICD-10-CM | POA: Diagnosis not present

## 2023-11-10 DIAGNOSIS — M1711 Unilateral primary osteoarthritis, right knee: Secondary | ICD-10-CM | POA: Diagnosis not present

## 2023-11-11 ENCOUNTER — Ambulatory Visit: Admitting: Neurology

## 2023-11-15 DIAGNOSIS — M1711 Unilateral primary osteoarthritis, right knee: Secondary | ICD-10-CM | POA: Diagnosis not present

## 2023-11-17 DIAGNOSIS — M1711 Unilateral primary osteoarthritis, right knee: Secondary | ICD-10-CM | POA: Diagnosis not present

## 2023-11-22 DIAGNOSIS — M1711 Unilateral primary osteoarthritis, right knee: Secondary | ICD-10-CM | POA: Diagnosis not present

## 2023-11-23 DIAGNOSIS — M1711 Unilateral primary osteoarthritis, right knee: Secondary | ICD-10-CM | POA: Diagnosis not present

## 2023-11-24 DIAGNOSIS — M1711 Unilateral primary osteoarthritis, right knee: Secondary | ICD-10-CM | POA: Diagnosis not present

## 2023-11-29 DIAGNOSIS — M1711 Unilateral primary osteoarthritis, right knee: Secondary | ICD-10-CM | POA: Diagnosis not present

## 2023-12-01 DIAGNOSIS — M1711 Unilateral primary osteoarthritis, right knee: Secondary | ICD-10-CM | POA: Diagnosis not present

## 2023-12-05 ENCOUNTER — Other Ambulatory Visit: Payer: Self-pay | Admitting: Internal Medicine

## 2023-12-05 ENCOUNTER — Ambulatory Visit (INDEPENDENT_AMBULATORY_CARE_PROVIDER_SITE_OTHER): Admitting: Internal Medicine

## 2023-12-05 ENCOUNTER — Encounter: Payer: Self-pay | Admitting: Internal Medicine

## 2023-12-05 VITALS — BP 120/70 | HR 65 | Temp 98.0°F | Wt 185.0 lb

## 2023-12-05 DIAGNOSIS — Z9841 Cataract extraction status, right eye: Secondary | ICD-10-CM

## 2023-12-05 DIAGNOSIS — H5713 Ocular pain, bilateral: Secondary | ICD-10-CM

## 2023-12-05 DIAGNOSIS — H04123 Dry eye syndrome of bilateral lacrimal glands: Secondary | ICD-10-CM

## 2023-12-05 DIAGNOSIS — Z961 Presence of intraocular lens: Secondary | ICD-10-CM | POA: Diagnosis not present

## 2023-12-05 DIAGNOSIS — Z23 Encounter for immunization: Secondary | ICD-10-CM | POA: Diagnosis not present

## 2023-12-05 DIAGNOSIS — Z9842 Cataract extraction status, left eye: Secondary | ICD-10-CM

## 2023-12-05 DIAGNOSIS — I10 Essential (primary) hypertension: Secondary | ICD-10-CM

## 2023-12-05 MED ORDER — XIIDRA 5 % OP SOLN: 1.0000 | Freq: Two times a day (BID) | OPHTHALMIC | 11 refills | Status: DC

## 2023-12-05 NOTE — Progress Notes (Signed)
 ==============================  [facility]: [phone]   -- Medical Office Visit --  Patient: Jay Fox      Age: 73 y.o.       Sex:  male  Date:   12/05/2023 Today's Healthcare Provider: Bernardino KANDICE Cone, MD  ==============================   Chief Complaint: Follow-up (2 -4 week follow up on eyes)  Discussed the use of AI scribe software for clinical note transcription with the patient, who gave verbal consent to proceed.  History of Present Illness 73 year old male who presents with worsening photosensitive eye pain and dry eyes post-cataract surgery.  He has been experiencing excruciating eye pain and photosensitivity following cataract surgery, progressively worsening over the past year. Initially triggered by fluorescent lights, the pain now extends to computer screens, television, and sunlight, making all light sources intolerable.  He describes a sensation of his eyelids sticking to his eyeballs, exacerbating the condition. Relief is found only in a dark room or upon waking, with symptoms developing over the day.  He uses Restasis  eye drops twice daily and over-the-counter Rutin dry eye drops without significant improvement. He discontinued a prescribed salve due to ineffectiveness and stopped Bromfenac  sodium due to burning without relief.  He has not tried Fioricet, prescribed for potential ocular migraines, as he does not recall picking it up. He uses Tylenol for knee pain, limiting the use of other pain medications like ibuprofen.  He reports that his pupils have been small since surgery and that he has always had trouble with dilation. The left eye is more affected, often requiring coverage when watching TV.  Background Reviewed: Problem List: has Asthma; Hypertension; Seasonal allergies; Urinary incontinence; Skin lesions, generalized; Prostate cancer (HCC); Insomnia; Heart palpitations; Benign essential hypertension; Asthma,  moderate persistent; History of renal cell carcinoma; Personal history of prostate cancer; History of radical prostatectomy; Leg swelling; Restless leg syndrome; Prediabetes; OSA (obstructive sleep apnea); Back wound; Generalized abdominal pain; High risk medication use; Abdominal distension; Altered olfactory perception; Jugular venous distension; Cervical lymphadenopathy; Hyperlipidemia; and Ocular pain, bilateral on their problem list. Past Medical History:  has a past medical history of Allergy (1954), Altered olfactory perception (05/31/2022), Anxiety, Arthritis, Asthma, BPH (benign prostatic hypertrophy), BPH (benign prostatic hypertrophy), Cancer (HCC), Cataract, Chronic kidney disease, Depression, GERD (gastroesophageal reflux disease), History of kidney cancer, History of prostate cancer (11/30/2021), Hyperlipidemia (12/08/2022), Hypertension, Leg swelling (11/30/2021), OSA (obstructive sleep apnea) (11/30/2021), Restless legs (11/30/2021), Seasonal allergies, and Sleep apnea. Past Surgical History:   has a past surgical history that includes Tonsillectomy; Renal cryoablation; Nasal sinus surgery (2019); and Cataract extraction, bilateral (Bilateral, 11/2022). Social History:   reports that he has never smoked. He has never used smokeless tobacco. He reports current alcohol use of about 10.0 standard drinks of alcohol per week. He reports that he does not use drugs. Family History:  family history includes Anxiety disorder in his mother; Cancer in his father, mother, paternal grandfather, and paternal grandmother; Colon cancer in his paternal grandfather and paternal grandmother; Emphysema in his mother; Hypertension in his father and mother; Liver cancer in his mother; Multiple myeloma in his father; Pancreatic cancer in his mother. Allergies:  is allergic to shellfish allergy and shellfish-derived products.   Medication Reconciliation: Current Outpatient Medications on File Prior to Visit   Medication Sig   albuterol  (VENTOLIN  HFA) 108 (90 Base) MCG/ACT inhaler Inhale 2 puffs into the lungs every 6 (six) hours as needed for wheezing or shortness of breath.   Ascorbic Acid (  VITAMIN C PO) Take 1 tablet by mouth once. 500mg    aspirin  EC 81 MG tablet Take 1 tablet (81 mg total) by mouth daily. Swallow whole.   budesonide -formoterol  (BREYNA ) 160-4.5 MCG/ACT inhaler Inhale 2 puffs into the lungs 2 (two) times daily.   butalbital -acetaminophen-caffeine  (FIORICET) 50-325-40 MG tablet Take 1 tablet by mouth every 6 (six) hours as needed for headache.   calcium  carbonate (TUMS - DOSED IN MG ELEMENTAL CALCIUM ) 500 MG chewable tablet Chew 1 tablet by mouth as needed.   Calcium  Carbonate-Vit D-Min (CALCIUM  1200 PO) Take 1 tablet by mouth daily.   Cholecalciferol (VITAMIN D3) 50 MCG (2000 UT) TABS Take 1 tablet by mouth daily.   citalopram  (CELEXA ) 10 MG tablet Take 10 mg by mouth daily.   diclofenac Sodium (VOLTAREN) 1 % GEL Place onto the skin.   erythromycin  ophthalmic ointment Place 1 Application into both eyes at bedtime.   fluticasone  (FLONASE ) 50 MCG/ACT nasal spray Place 2 sprays into both nostrils daily.   irbesartan  (AVAPRO ) 300 MG tablet Take 1 tablet by mouth once daily   loratadine  (CLARITIN ) 10 MG tablet Take 1 tablet (10 mg total) by mouth daily.   Multiple Vitamin (MULTIVITAMIN) tablet Take 1 tablet by mouth daily.   Omega-3 Fatty Acids (FISH OIL) 1200 MG CAPS Take 1,200 mg by mouth in the morning and at bedtime.   pregabalin  (LYRICA ) 200 MG capsule Take 1 capsule (200 mg total) by mouth at bedtime.   Respiratory Therapy Supplies MISC Filters Hoses tubing masks headgear cleaning supplies for dreamwear and  philips respironics bipap machine.   rosuvastatin  (CRESTOR ) 20 MG tablet Take 1 tablet (20 mg total) by mouth daily.   Saline (SIMPLY SALINE) 0.9 % AERS Place 2 each into the nose as directed. Use nightly for sinus hygiene long-term.  Can also be used as many times daily as  desired to assist with clearing congested sinuses.   Spacer/Aero-Holding Chambers (AEROCHAMBER MV) inhaler Use as directed with albuterol  inhaler.   TURMERIC PO Take 1,000 mg by mouth in the morning and at bedtime.   venlafaxine  (EFFEXOR ) 100 MG tablet Take 1 tablet (100 mg total) by mouth daily.   vitamin B-12 (CYANOCOBALAMIN) 500 MCG tablet Take 1 tablet by mouth daily.   zolpidem  (AMBIEN ) 10 MG tablet Take 1 tablet (10 mg total) by mouth at bedtime as needed for sleep. Can NOT take with opioid pain medicine, alcohol, or other sedative.  Risk dementia/mornings impaired alertness   sildenafil (REVATIO) 20 MG tablet 1 tablet Orally Once a day (Patient not taking: Reported on 12/05/2023)   No current facility-administered medications on file prior to visit.   Medications Discontinued During This Encounter  Medication Reason   Bromfenac  Sodium 0.07 % SOLN Side effect (s)   cycloSPORINE  (RESTASIS ) 0.05 % ophthalmic emulsion      Physical Exam:    12/05/2023   11:04 AM 11/04/2023    1:12 PM 10/17/2023    1:37 PM  Vitals with BMI  Height  5' 9.5 5' 9.5  Weight 185 lbs 182 lbs 13 oz 185 lbs  BMI  26.62 26.94  Systolic 120 132 879  Diastolic 70 70 64  Pulse 65 70 80  Vital signs reviewed.  Nursing notes reviewed. Weight trend reviewed. Physical Activity: Insufficiently Active (10/14/2023)   Exercise Vital Sign    Days of Exercise per Week: 4 days    Minutes of Exercise per Session: 30 min   General Appearance:  No acute distress appreciable.  Well-groomed, healthy-appearing male.  Well proportioned with no abnormal fat distribution.  Good muscle tone. Pulmonary:  Normal work of breathing at rest, no respiratory distress apparent. SpO2: 98 %  Musculoskeletal: All extremities are intact.  Neurological:  Awake, alert, oriented, and engaged.  No obvious focal neurological deficits or cognitive impairments.  Sensorium seems unclouded.   Speech is clear and coherent with logical  content. Psychiatric:  Appropriate mood, pleasant and cooperative demeanor, thoughtful and engaged during the exam  Kept in dark room, eyes straining with light. Seem watery     08/04/2023    1:16 PM 01/18/2023   11:16 AM 12/28/2022   10:11 AM 12/08/2022   10:00 AM  PHQ 2/9 Scores  PHQ - 2 Score 1 4 1  0  PHQ- 9 Score 1 11 8 6    Office Visit on 04/28/2023  Component Date Value Ref Range Status   Iron 04/28/2023 104  50 - 180 mcg/dL Final   TIBC 97/86/7974 304  250 - 425 mcg/dL (calc) Final   %SAT 97/86/7974 34  20 - 48 % (calc) Final   Ferritin 04/28/2023 168  24 - 380 ng/mL Final  Office Visit on 02/03/2023  Component Date Value Ref Range Status   S' Lateral 02/23/2023 2.95  cm Final   Area-P 1/2 02/23/2023 3.03  cm2 Final   P 1/2 time 02/23/2023 935  msec Final   Est EF 02/23/2023 60 - 65%   Final   Sed Rate 02/03/2023 9  0 - 20 mm/h Final   CRP 02/03/2023 <3.0  <8.0 mg/L Final  Clinical Support on 12/28/2022  Component Date Value Ref Range Status   Cologuard 06/07/2022 Negative  Negative Final  Office Visit on 12/08/2022  Component Date Value Ref Range Status   WBC 12/08/2022 6.0  4.0 - 10.5 K/uL Final   RBC 12/08/2022 4.89  4.22 - 5.81 Mil/uL Final   Platelets 12/08/2022 240.0  150.0 - 400.0 K/uL Final   Hemoglobin 12/08/2022 14.2  13.0 - 17.0 g/dL Final   HCT 90/74/7975 42.9  39.0 - 52.0 % Final   MCV 12/08/2022 87.8  78.0 - 100.0 fl Final   MCHC 12/08/2022 33.1  30.0 - 36.0 g/dL Final   RDW 90/74/7975 14.1  11.5 - 15.5 % Final   Sodium 12/08/2022 141  135 - 145 mEq/L Final   Potassium 12/08/2022 4.1  3.5 - 5.1 mEq/L Final   Chloride 12/08/2022 105  96 - 112 mEq/L Final   CO2 12/08/2022 27  19 - 32 mEq/L Final   Glucose, Bld 12/08/2022 109 (H)  70 - 99 mg/dL Final   BUN 90/74/7975 17  6 - 23 mg/dL Final   Creatinine, Ser 12/08/2022 1.02  0.40 - 1.50 mg/dL Final   Total Bilirubin 12/08/2022 0.7  0.2 - 1.2 mg/dL Final   Alkaline Phosphatase 12/08/2022 67  39 - 117 U/L  Final   AST 12/08/2022 18  0 - 37 U/L Final   ALT 12/08/2022 21  0 - 53 U/L Final   Total Protein 12/08/2022 7.1  6.0 - 8.3 g/dL Final   Albumin 90/74/7975 4.5  3.5 - 5.2 g/dL Final   GFR 90/74/7975 73.99  >60.00 mL/min Final   Calcium  12/08/2022 9.9  8.4 - 10.5 mg/dL Final   Cholesterol 90/74/7975 134  0 - 200 mg/dL Final   Triglycerides 90/74/7975 227.0 (H)  0.0 - 149.0 mg/dL Final   HDL 90/74/7975 50.90  >39.00 mg/dL Final   VLDL 90/74/7975 45.4 (H)  0.0 -  40.0 mg/dL Final   LDL Cholesterol 12/08/2022 38  0 - 99 mg/dL Final   Total CHOL/HDL Ratio 12/08/2022 3   Final   NonHDL 12/08/2022 83.39   Final   TSH 12/08/2022 2.28  0.35 - 5.50 uIU/mL Final   Hgb A1c MFr Bld 12/08/2022 5.9  4.6 - 6.5 % Final  Office Visit on 05/31/2022  Component Date Value Ref Range Status   COLOGUARD 06/07/2022 Negative  Negative Final  Orders Only on 04/07/2022  Component Date Value Ref Range Status   Total Protein, Urine 04/07/2022 7.9  Not Estab. mg/dL Final   Total Protein, Urine-Ur/day 04/07/2022 122  30 - 150 mg/24 hr Final   Albumin, U 04/07/2022 50.3  % Final   ALPHA 1 URINE 04/07/2022 9.4  % Final   Alpha 2, Urine 04/07/2022 11.9  % Final   % BETA, Urine 04/07/2022 23.1  % Final   GAMMA GLOBULIN URINE 04/07/2022 5.4  % Final   Free Kappa Lt Chains,Ur 04/07/2022 12.36  1.17 - 86.46 mg/L Final   Free Lambda Lt Chains,Ur 04/07/2022 1.76  0.27 - 15.21 mg/L Corrected   Free Kappa/Lambda Ratio 04/07/2022 7.02  1.83 - 14.26 Corrected   Immunofixation Result, Urine 04/07/2022 Comment   Corrected   Total Volume 04/07/2022 1,550   Final   M-SPIKE %, Urine 04/07/2022 Not Observed  Not Observed % Corrected   Note: 04/07/2022 Comment   Corrected  Appointment on 04/05/2022  Component Date Value Ref Range Status   LDH 04/05/2022 131  98 - 192 U/L Final   Beta-2  Microglobulin 04/05/2022 1.5  0.6 - 2.4 mg/L Final   Kappa free light chain 04/05/2022 18.5  3.3 - 19.4 mg/L Final   Lambda free light chains  04/05/2022 14.1  5.7 - 26.3 mg/L Final   Kappa, lambda light chain ratio 04/05/2022 1.31  0.26 - 1.65 Final   IgG (Immunoglobin G), Serum 04/05/2022 787  603 - 1,613 mg/dL Final   IgA 98/77/7975 149  61 - 437 mg/dL Final   IgM (Immunoglobulin M), Srm 04/05/2022 88  20 - 172 mg/dL Final   Total Protein ELP 04/05/2022 6.6  6.0 - 8.5 g/dL Corrected   Albumin SerPl Elph-Mcnc 04/05/2022 3.8  2.9 - 4.4 g/dL Corrected   Alpha 1 98/77/7975 0.2  0.0 - 0.4 g/dL Corrected   Alpha2 Glob SerPl Elph-Mcnc 04/05/2022 0.7  0.4 - 1.0 g/dL Corrected   B-Globulin SerPl Elph-Mcnc 04/05/2022 1.0  0.7 - 1.3 g/dL Corrected   Gamma Glob SerPl Elph-Mcnc 04/05/2022 0.9  0.4 - 1.8 g/dL Corrected   M Protein SerPl Elph-Mcnc 04/05/2022 Not Observed  Not Observed g/dL Corrected   Globulin, Total 04/05/2022 2.8  2.2 - 3.9 g/dL Corrected   Albumin/Glob SerPl 04/05/2022 1.4  0.7 - 1.7 Corrected   IFE 1 04/05/2022 Comment   Corrected   Please Note 04/05/2022 Comment   Corrected   Sodium 04/05/2022 140  135 - 145 mmol/L Final   Potassium 04/05/2022 4.2  3.5 - 5.1 mmol/L Final   Chloride 04/05/2022 106  98 - 111 mmol/L Final   CO2 04/05/2022 28  22 - 32 mmol/L Final   Glucose, Bld 04/05/2022 110 (H)  70 - 99 mg/dL Final   BUN 98/77/7975 19  8 - 23 mg/dL Final   Creatinine 98/77/7975 0.97  0.61 - 1.24 mg/dL Final   Calcium  04/05/2022 10.2  8.9 - 10.3 mg/dL Final   Total Protein 98/77/7975 7.0  6.5 - 8.1 g/dL Final   Albumin  04/05/2022 4.4  3.5 - 5.0 g/dL Final   AST 98/77/7975 15  15 - 41 U/L Final   ALT 04/05/2022 20  0 - 44 U/L Final   Alkaline Phosphatase 04/05/2022 69  38 - 126 U/L Final   Total Bilirubin 04/05/2022 0.5  0.3 - 1.2 mg/dL Final   GFR, Estimated 04/05/2022 >60  >60 mL/min Final   Anion gap 04/05/2022 6  5 - 15 Final   WBC Count 04/05/2022 5.8  4.0 - 10.5 K/uL Final   RBC 04/05/2022 4.96  4.22 - 5.81 MIL/uL Final   Hemoglobin 04/05/2022 14.7  13.0 - 17.0 g/dL Final   HCT 98/77/7975 43.7  39.0 - 52.0 %  Final   MCV 04/05/2022 88.1  80.0 - 100.0 fL Final   MCH 04/05/2022 29.6  26.0 - 34.0 pg Final   MCHC 04/05/2022 33.6  30.0 - 36.0 g/dL Final   RDW 98/77/7975 13.1  11.5 - 15.5 % Final   Platelet Count 04/05/2022 220  150 - 400 K/uL Final   nRBC 04/05/2022 0.0  0.0 - 0.2 % Final   Neutrophils Relative % 04/05/2022 62  % Final   Neutro Abs 04/05/2022 3.6  1.7 - 7.7 K/uL Final   Lymphocytes Relative 04/05/2022 20  % Final   Lymphs Abs 04/05/2022 1.1  0.7 - 4.0 K/uL Final   Monocytes Relative 04/05/2022 11  % Final   Monocytes Absolute 04/05/2022 0.6  0.1 - 1.0 K/uL Final   Eosinophils Relative 04/05/2022 6  % Final   Eosinophils Absolute 04/05/2022 0.4  0.0 - 0.5 K/uL Final   Basophils Relative 04/05/2022 1  % Final   Basophils Absolute 04/05/2022 0.1  0.0 - 0.1 K/uL Final   Immature Granulocytes 04/05/2022 0  % Final   Abs Immature Granulocytes 04/05/2022 0.02  0.00 - 0.07 K/uL Final  Office Visit on 03/29/2022  Component Date Value Ref Range Status   Amylase 03/29/2022 50  27 - 131 U/L Final   Lipase 03/29/2022 20.0  11.0 - 59.0 U/L Final   PSA 03/29/2022 0.00 Repeated and verified X2. (L)  0.10 - 4.00 ng/mL Final   Sodium 03/29/2022 143  135 - 145 mEq/L Final   Potassium 03/29/2022 4.7  3.5 - 5.1 mEq/L Final   Chloride 03/29/2022 105  96 - 112 mEq/L Final   CO2 03/29/2022 30  19 - 32 mEq/L Final   Glucose, Bld 03/29/2022 111 (H)  70 - 99 mg/dL Final   BUN 98/84/7975 20  6 - 23 mg/dL Final   Creatinine, Ser 03/29/2022 0.98  0.40 - 1.50 mg/dL Final   Total Bilirubin 03/29/2022 0.5  0.2 - 1.2 mg/dL Final   Alkaline Phosphatase 03/29/2022 76  39 - 117 U/L Final   AST 03/29/2022 15  0 - 37 U/L Final   ALT 03/29/2022 19  0 - 53 U/L Final   Total Protein 03/29/2022 6.8  6.0 - 8.3 g/dL Final   Albumin 98/84/7975 4.5  3.5 - 5.2 g/dL Final   GFR 98/84/7975 78.01  >60.00 mL/min Final   Calcium  03/29/2022 10.1  8.4 - 10.5 mg/dL Final   WBC 98/84/7975 6.6  4.0 - 10.5 K/uL Final   RBC  03/29/2022 4.89  4.22 - 5.81 Mil/uL Final   Platelets 03/29/2022 226.0  150.0 - 400.0 K/uL Final   Hemoglobin 03/29/2022 14.5  13.0 - 17.0 g/dL Final   HCT 98/84/7975 43.3  39.0 - 52.0 % Final   MCV 03/29/2022 88.6  78.0 - 100.0  fl Final   MCHC 03/29/2022 33.4  30.0 - 36.0 g/dL Final   RDW 98/84/7975 13.6  11.5 - 15.5 % Final  Lab on 03/03/2022  Component Date Value Ref Range Status   Immunofix Electr Int 03/03/2022    Final   Immunoglobulin A 03/03/2022 151  70 - 320 mg/dL Final   IgG (Immunoglobin G), Serum 03/03/2022 836  600 - 1,540 mg/dL Final   IgM, Serum 87/79/7976 92  50 - 300 mg/dL Final  Office Visit on 12/14/2021  Component Date Value Ref Range Status   Alcohol Metabolites 12/14/2021 NEGATIVE  <500 ng/mL Final   Amphetamines 12/14/2021 NEGATIVE  <500 ng/mL Final   Benzodiazepines 12/14/2021 NEGATIVE  <100 ng/mL Final   Buprenorphine, Urine 12/14/2021 NEGATIVE  <5 ng/mL Final   Cocaine Metabolite 12/14/2021 NEGATIVE  <150 ng/mL Final   6 Acetylmorphine 12/14/2021 NEGATIVE  <10 ng/mL Final   Marijuana Metabolite 12/14/2021 NEGATIVE  <20 ng/mL Final   MDMA 12/14/2021 NEGATIVE  <500 ng/mL Final   Opiates 12/14/2021 NEGATIVE  <100 ng/mL Final   Oxycodone 12/14/2021 NEGATIVE  <100 ng/mL Final   Creatinine 12/14/2021 129.6  > or = 20.0 mg/dL Final   pH 89/97/7976 5.1  4.5 - 9.0 Final   Oxidant 12/14/2021 NEGATIVE  <200 mcg/mL Final   Notes and Comments 12/14/2021    Final  There may be more visits with results that are not included.  No image results found. No results found.       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 Symptoms include excruciating eye pain exacerbated by exposure to light from sources such as fluorescent lights, computer screens, TV, and sunlight. Restasis  has been ineffective, and bromfenac  sodium caused burning and was discontinued. Fioricet was prescribed but not tried. The current theory suggests that dry eyes are damaging the outer layer of the eyes, increasing photosensitivity and pain. Aggressive treatment of dry eyes is necessary for healing and symptom reduction. Prescribe Xiidra  (Lifitegrast ) drops to be used with or as a replacement for Restasis . Refer to an ophthalmologist for procedures like intense pulse light therapy, LipiFlow, or punctal plugs. Increase the use of eye ointments to prevent eyelid sticking and further damage. Encourage regular use of artificial tears for lubrication. Advise discontinuation of Restasis  if ineffective. Consider ibuprofen for pain management, ensuring a four-hour gap between doses if also taking acetaminophen for knee pain.  This patient presents with persistent, severe dry eye symptoms and extreme photosensitivity following cataract replacement, with progressive worsening over 1.2 years. Despite appropriate first-line management--including topical cyclosporine  (Restasis ) and multiple artificial tear formulations--there has been no clinically meaningful improvement. The patient continues to experience significant ocular pain and photophobia, which are impacting quality of life and daily functioning.  I theorize the failure to improve is due to ongoing dry eye induced epithelial damage. Post-cataract dry eye is well-documented and may persist or worsen in a subset of patients, particularly those with pre-existing ocular surface disease or meibomian gland dysfunction. The American Academy of Ophthalmology recommends thorough evaluation and escalation of care for patients with refractory symptoms after cataract surgery.  Persistent photosensitivity and  pain in the context of severe dry eye may  indicate ongoing ocular surface inflammation, tear film instability, or neuropathic pain, all of which warrant subspecialty evaluation and procedural intervention. TFOS DEWS III Management and Therapy Report provides a consensus-based algorithm for dry eye management, emphasizing escalation to in-office procedures (punctal plugs, thermal pulsation, intense pulsed light, meibomian gland expression) and advanced therapies (autologous serum tears, amniotic membrane grafts) for patients unresponsive to topical agents. Need for vaccination After discussion of medical recommendations/risks/benefits, vaccine was given      ORDER ASSOCIATIONS  #   DIAGNOSIS / CONDITION ICD-10 ENCOUNTER ORDER     ICD-10-CM   1. Dry eyes  H04.123 Lifitegrast  (XIIDRA ) 5 % SOLN    Ambulatory referral to Ophthalmology    2. Status post bilateral cataract extraction  Z98.41 Lifitegrast  (XIIDRA ) 5 % SOLN   Z98.42 Ambulatory referral to Ophthalmology    3. H/O artificial lens replacement  Z96.1 Lifitegrast  (XIIDRA ) 5 % SOLN    Ambulatory referral to Ophthalmology    4. Ocular pain, bilateral  H57.13 Lifitegrast  (XIIDRA ) 5 % SOLN    Ambulatory referral to Ophthalmology    5. Pain of both eyes  H57.13 Lifitegrast  (XIIDRA ) 5 % SOLN    Ambulatory referral to Ophthalmology    6. Need for vaccination  Z23 Flu vaccine HIGH DOSE PF(Fluzone Trivalent)           Orders Placed in Encounter:   Meds ordered this encounter  Medications   Lifitegrast  (XIIDRA ) 5 % SOLN    Sig: Apply 1 each to eye 2 (two) times daily.    Dispense:  5 each    Refill:  11    Orders Placed This Encounter  Procedures   Flu vaccine HIGH DOSE PF(Fluzone Trivalent)   Ambulatory referral to Ophthalmology    Referral Priority:   Routine    Referral Type:   Consultation    Referral Reason:   Patient Preference    Requested Specialty:   Ophthalmology    Number of Visits Requested:   1   ED Discharge  Orders          Ordered    Lifitegrast  (XIIDRA ) 5 % SOLN  2 times daily        12/05/23 1136    Ambulatory referral to Ophthalmology       Comments: Ophthalmology Referral Support:  This patient presents with persistent, severe dry eye symptoms and extreme photosensitivity following cataract replacement, with progressive worsening over 1.5 years. Despite appropriate first-line management-including topical cyclosporine  (Restasis ) and multiple artificial tear formulations-there has been no clinically meaningful improvement. The patient continues to experience significant ocular pain and photophobia, which are impacting quality of life and daily functioning.  Evidence-based rationale for referral:  - Post-cataract dry eye is well-documented and may persist or worsen in a subset of patients, particularly those with pre-existing ocular surface disease or meibomian gland dysfunction. The American Academy of Ophthalmology recommends thorough evaluation and escalation of care for patients with refractory symptoms after cataract surgery.[1][2][3][4]  - TFOS DEWS III Management and Therapy Report provides a consensus-based algorithm for dry eye management, emphasizing escalation to in-office procedures (punctal plugs, thermal pulsation, intense pulsed light, meibomian gland expression) and advanced therapies (autologous serum tears, amniotic membrane grafts) for patients unresponsive to topical agents.[5]  - Systematic reviews confirm the efficacy of in-office procedures such as punctal plugs, LipiFlow, and IPL therapy in improving tear film stability and symptoms in refractory dry eye, with moderate certainty for punctal plugs and emerging evidence for device-based therapies.[6]  - Persistent photosensitivity and  pain in the context of severe dry eye may indicate ongoing ocular surface inflammation, tear film instability, or neuropathic pain, all of which warrant subspecialty evaluation and procedural  intervention.[5][7][6]  - Alternative topical agents (e.g., secretagogues such as diquafosol) and advanced lubricants (e.g., hyaluronic acid/trehalose) have shown benefit in post-cataract dry eye, but procedural therapies are indicated when these fail.[8][9]  Summary:  Given the chronicity, severity, and refractoriness of symptoms, this patient meets criteria for escalation to procedural management by an ophthalmologist with expertise in advanced dry eye therapies. A fresh referral is medically necessary to evaluate candidacy for in-office procedures and to optimize ocular surface health, in accordance with current consensus guidelines and best practices.[5][4][6] References 1. The Impact of Cataract Surgery on Tear Film Physiology: Signs and Symptoms, Progression and Treatment. Nuzzi A, Tibaldi D, Nuzzi R. [employer] in [employer]. 2025;[id number]. doi:[id number]. 2. Incidence and Pattern of Dry Eye After Cataract Surgery. Kasetsuwan N, Satitpitakul V, Changul T, Jariyakosol S. PloS One. 2013;8(11):e78657. doi:10.1371/journal.pone.9921342. 3. Discrepancies in Persistent Dry Eye Signs and Symptoms in Bilateral Pseudophakic Patients. Hanyuda A, Ayaki M, Tsubota K, Negishi K. Journal of Clinical Medicine. 2019;8(2):E211. doi:10.3390/jcm8020211. 4. Cornea/External Disease Summary Benchmarks - 2023. American Academy of Ophthalmology. 5. TFOS DEWS III Management and Therapy Report. Joshua LITTIE Lorrene ROEL Melodie CHRISTELLA, et al. American Journal of Ophthalmology. 2025;:S0002-9394(25)00274-0. doi:10.1016/j.ajo.2025.05.039. 6. Interventions for Dry Eye: An Overview of Systematic Reviews. Lucienne SHAUNNA Dyanne JENEANE Wilfred GORMAN et al. JAMA Ophthalmology. 2024;142(1):58-74. doi:10.1001/jamaophthalmol.2023.5751. 7. Mechanisms and Management of Dry Eye in Cataract Surgery Patients. Sutu C, [place], Afshari [place]. Current Opinion in Ophthalmology. 2016;27(1):24-30. doi:10.1097/ICU.0000000000000227. 8. Comparison of the  Efficacy Between Topical Diquafosol and Artificial Tears in the Treatment of Dry Eye Following Cataract Surgery: A Meta-Analysis of Randomized Controlled Trials. Maryelizabeth OLEGARIO Raliegh GORMAN Laurence PATRIC Medicine. 2017;96(39):e8174. doi:10.1097/MD.0000000000008174. 9. Hyaluronic Acid/Trehalose Ophthalmic Solution in Reducing Post-Cataract Surgery Dry Eye Signs and Symptoms: A Prospective, Interventional, Randomized, Open-Label Study. Mencucci R, Favuzza E, Decandia G, Cennamo M, Giansanti F. Journal of Clinical Medicine. 2021;10(20):4699. doi:10.3390/jcm10204699.   12/05/23 1136    Flu vaccine HIGH DOSE PF(Fluzone Trivalent)        12/05/23 1138              This document was synthesized by artificial intelligence (Abridge) using HIPAA-compliant recording of the clinical interaction;   We discussed the use of AI scribe software for clinical note transcription with the patient, who gave verbal consent to proceed. additional Info: This encounter employed state-of-the-art, real-time, collaborative documentation. The patient actively reviewed and assisted in updating their electronic medical record on a shared screen, ensuring transparency and facilitating joint problem-solving for the problem list, overview, and plan. This approach promotes accurate, informed care. The treatment plan was discussed and reviewed in detail, including medication safety, potential side effects, and all patient questions. We confirmed understanding and comfort with the plan. Follow-up instructions were established, including contacting the office for any concerns, returning if symptoms worsen, persist, or new symptoms develop, and precautions for potential emergency department visits.   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   Diagnostic uncertainty regarding the etiology of progressive photophobia and pain, necessitating systematic evaluation of prior interventions  and formulation of a new management strategy.   Significant care coordination, including referral navigation for advanced ophthalmologic procedures after unsuccessful attempts with multiple providers.  Total Encounter Time:        32 minutes  ------------------------------------------------------------ CLINICAL DECISION SUPPORT UTILIZATION ------------------------------------------------------------ Utilized American Academy of Ophthalmology guidelines and TFOS DEWS III consensus algorithm to synthesize evidence for refractory dry eye and post-cataract photophobia management, adapting recommendations to patient-specific factors including prior therapy failures and ongoing epithelial damage.    ------------------------------------------------------------ MEDICAL DECISION-MAKING ANALYSIS ------------------------------------------------------------ Problem Complexity: HIGH    - Primary Challenge:       Severe, persistent dry eye syndrome with progressive, excruciating photosensitive pain since cataract surgery, unresponsive to Restasis , artificial tears, and topical NSAIDs.    - Secondary Factors:       History of ocular surface disease, failed pain management with multiple topical and oral agents, comorbidities (asthma, hypertension, CKD, BPH, history of cancer) influencing medication selection and risk profile.    - Clinical Reasoning:       Systematic review of prior interventions, assessment of ongoing  epithelial damage, and evidence-based escalation to Lifitegrast  (Xiidra ) and procedural referral per best practices.  ------------------------------------------------------------ PATIENT EDUCATION PROVIDED ------------------------------------------------------------ Education Barrier:   Health literacy and psychological distress due to chronic pain and failed interventions.  Intervention Approach:   Used teach-back method and visual aids to explain pathophysiology of dry eye, rationale for escalation, and procedural options. Provided written summary and collaborative EMR review to verify understanding.  Time Investment Rationale:   Extended education was medically necessary to address frustration with prior care, ensure comprehension of new treatment plan, and facilitate self-advocacy in navigating referrals.  Outcome Achieved:   Patient demonstrated understanding of next steps, medication changes, and referral process; verbalized comfort with plan and actively participated in EMR documentation.  ------------------------------------------------------------ CARE COORDINATION ------------------------------------------------------------   Communicated with referral coordinator to specify need for ophthalmologist capable of advanced dry eye procedures (IPL, LipiFlow, punctal plugs).   Documented failed prior referral attempts and clarified procedural requirements to streamline access.   Provided patient with written instructions and support for ongoing advocacy with insurance and specialty clinics.  ------------------------------------------------------------ MEDICAL NECESSITY JUSTIFICATION ------------------------------------------------------------ The 32-minute encounter, with 22 minutes dedicated to medical decision-making, was medically necessary due to:    PRIMARY COMPLEXITY FACTOR:    Refractory, severe dry eye and photosensitive pain post-cataract surgery, requiring physician-level  expertise for evidence-based escalation beyond standard topical therapies.    SECONDARY COMPLEXITY FACTOR:    Time-intensive care coordination and referral management after multiple failed specialist interventions, necessitating individualized advocacy and procedural planning.    EDUCATION/COORDINATION FACTOR:    Overcoming health literacy barriers and psychological distress through collaborative EMR review and detailed patient education to ensure safe, effective treatment adoption.  ------------------------------------------------------------ PROVIDER ATTESTATION ------------------------------------------------------------ I attest that this encounter required 32 total minutes. The above accurately reflects the clinical challenges, decision-making complexity, and time investment necessary for optimal patient care.  ============================================================

## 2023-12-05 NOTE — Patient Instructions (Addendum)
 For refractory symptoms, in-office procedures such as punctal plugs, thermal pulsation (LipiFlow), or intense pulsed light (IPL) therapy targeting meibomian gland dysfunction may be considered, though long-term efficacy data are limited and risks should be discussed.[4-5]  It was a pleasure seeing you today! Your health and satisfaction are our top priorities.  Bernardino Cone, MD  VISIT SUMMARY: You came in today because of worsening eye pain and sensitivity to light following your cataract surgery. Your symptoms have been getting worse over the past year, making it difficult to tolerate any light sources, including fluorescent lights, computer screens, TV, and sunlight. You also experience a sensation of your eyelids sticking to your eyeballs, which adds to your discomfort.  YOUR PLAN: -SEVERE DRY EYE SYNDROME WITH PHOTOSENSITIVITY AND EYE PAIN: Severe dry eye syndrome means your eyes are not producing enough tears or the right quality of tears, leading to dryness, pain, and sensitivity to light. To help manage this, we are prescribing Xiidra  (Lifitegrast ) eye drops, which you can use with or instead of Restasis . We are also referring you to an ophthalmologist to explore procedures like intense pulse light therapy, LipiFlow, or punctal plugs that may provide relief. Additionally, you should use eye ointments more frequently to prevent your eyelids from sticking to your eyeballs and use artificial tears regularly to keep your eyes lubricated. If Restasis  continues to be ineffective, you should stop using it. For pain management, you can consider taking ibuprofen, but make sure to leave a four-hour gap between doses if you are also taking acetaminophen for your knee pain.  INSTRUCTIONS: Please follow up with an ophthalmologist for further evaluation and potential procedures like intense pulse light therapy, LipiFlow, or punctal plugs. Continue using Xiidra  (Lifitegrast ) eye drops as prescribed and increase  the use of eye ointments and artificial tears. Discontinue Restasis  if it remains ineffective. Consider ibuprofen for pain management, ensuring a four-hour gap between doses if also taking acetaminophen for knee pain.  Your Providers PCP: Cone [name], MD,  [phone]) Referring Provider: Cone [name], MD,  [phone]) Care Team Provider: Brien Belvie BRAVO, MD,  [phone]) Care Team Provider: Leigh Venetia CROME, MD,  [phone])  NEXT STEPS: [x]  Early Intervention: Schedule sooner appointment, call our on-call services, or go to emergency room if there is any significant Increase in pain or discomfort New or worsening symptoms Sudden or severe changes in your health [x]  Flexible Follow-Up: We recommend a Return in about 1 month (around 01/04/2024). for optimal routine care. This allows for progress monitoring and treatment adjustments. [x]  Preventive Care: Schedule your annual preventive care visit! It's typically covered by insurance and helps identify potential health issues early. [x]  Lab & X-ray Appointments: Incomplete tests scheduled today, or call to schedule. X-rays: [facility] (M-F, 8:30am-noon or 1pm-5pm). [x]  Medical Information Release: Sign a release form at front desk to obtain relevant medical information we don't have.  MAKING THE MOST OF OUR FOCUSED 20 MINUTE APPOINTMENTS: [x]   Clearly state your top concerns at the beginning of the visit to focus our discussion [x]   If you anticipate you will need more time, please inform the front desk during scheduling - we can book multiple appointments in the same week. [x]   If you have transportation problems- use our convenient video appointments or ask about transportation support. [x]   We can get down to business faster if you use MyChart to update information before the visit and submit non-urgent questions before your visit. Thank you for taking the time to  provide details through MyChart.  Let our  nurse know and she can import this information into your encounter documents.  Arrival and Wait Times: [x]   Arriving on time ensures that everyone receives prompt attention. [x]   Early morning (8a) and afternoon (1p) appointments tend to have shortest wait times. [x]   Unfortunately, we cannot delay appointments for late arrivals or hold slots during phone calls.  Getting Answers and Following Up [x]   Simple Questions & Concerns: For quick questions or basic follow-up after your visit, reach us  at [phone] or MyChart messaging. [x]   Complex Concerns: If your concern is more complex, scheduling an appointment might be best. Discuss this with the staff to find the most suitable option. [x]   Lab & Imaging Results: We'll contact you directly if results are abnormal or you don't use MyChart. Most normal results will be on MyChart within 2-3 business days, with a review message from Dr. Jesus. Haven't heard back in 2 weeks? Need results sooner? Contact us  at [phone]. [x]   Referrals: Our referral coordinator will manage specialist referrals. The specialist's office should contact you within 2 weeks to schedule an appointment. Call us  if you haven't heard from them after 2 weeks.  Staying Connected [x]   MyChart: Activate your MyChart for the fastest way to access results and message us . See the last page of this paperwork for instructions on how to activate.  Bring to Your Next Appointment [x]   Medications: Please bring all your medication bottles to your next appointment to ensure we have an accurate record of your prescriptions. [x]   Health Diaries: If you're monitoring any health conditions at home, keeping a diary of your readings can be very helpful for discussions at your next appointment.  Billing [x]   X-ray & Lab Orders: These are billed by separate companies. Contact the invoicing company directly for questions or concerns. [x]   Visit Charges: Discuss any billing inquiries with  our administrative services team.  Your Satisfaction Matters [x]   Share Your Experience: We strive for your satisfaction! If you have any complaints, or preferably compliments, please let Dr. Jesus know directly or contact our Practice Administrators, Manuelita Rubin or [employer], by asking at the front desk.   Reviewing Your Records [x]   Review this early draft of your clinical encounter notes below and the final encounter summary tomorrow on MyChart after its been completed.  All orders placed so far are visible here: Dry eyes -     Xiidra ; Apply 1 each to eye 2 (two) times daily.  Dispense: 5 each; Refill: 11 -     Ambulatory referral to Ophthalmology  Status post bilateral cataract extraction -     Xiidra ; Apply 1 each to eye 2 (two) times daily.  Dispense: 5 each; Refill: 11 -     Ambulatory referral to Ophthalmology  H/O artificial lens replacement -     Xiidra ; Apply 1 each to eye 2 (two) times daily.  Dispense: 5 each; Refill: 11 -     Ambulatory referral to Ophthalmology  Ocular pain, bilateral Assessment & Plan: Severe dry eye syndrome with photosensitivity and eye pain, status post bilateral cataract surgery   Severe dry eye syndrome with photosensitivity and eye pain has persisted and worsened over the past year following bilateral cataract surgery. Symptoms include excruciating eye pain exacerbated by exposure to light from sources such as fluorescent lights, computer screens, TV, and sunlight. Restasis  has been ineffective, and bromfenac  sodium caused burning and was discontinued. Fioricet was prescribed but  not tried. The current theory suggests that dry eyes are damaging the outer layer of the eyes, increasing photosensitivity and pain. Aggressive treatment of dry eyes is necessary for healing and symptom reduction. Prescribe Xiidra  (Lifitegrast ) drops to be used with or as a replacement for Restasis . Refer to an ophthalmologist for procedures like intense pulse light  therapy, LipiFlow, or punctal plugs. Increase the use of eye ointments to prevent eyelid sticking and further damage. Encourage regular use of artificial tears for lubrication. Advise discontinuation of Restasis  if ineffective. Consider ibuprofen for pain management, ensuring a four-hour gap between doses if also taking acetaminophen for knee pain.  Orders: -     Xiidra ; Apply 1 each to eye 2 (two) times daily.  Dispense: 5 each; Refill: 11 -     Ambulatory referral to Ophthalmology  Pain of both eyes -     Xiidra ; Apply 1 each to eye 2 (two) times daily.  Dispense: 5 each; Refill: 11 -     Ambulatory referral to Ophthalmology  Need for vaccination -     Flu vaccine HIGH DOSE PF(Fluzone Trivalent)   After-Visit Summary  Summary of Visit:  The patient presents with persistent, severe dry eye symptoms and extreme photosensitivity (light sensitivity) for over 1.5 years following cataract replacement surgery. Symptoms have not improved with cyclosporine  (Restasis ) or artificial tears and have progressively worsened, resulting in significant ocular pain and photophobia. The patient is seeking referral to a new ophthalmologist with expertise in advanced dry eye procedures.  Assessment:  - Chronic, severe dry eye disease (DED) and photosensitivity, refractory to first-line topical immunomodulatory therapy and lubricants, in the post-cataract setting.  - Ongoing ocular surface inflammation and tear film instability are likely contributors to pain and photophobia.  - The clinical course and lack of response to standard therapy meet criteria for escalation to procedural and advanced management per TFOS DEWS III and recent systematic reviews.[1][2][3]  Plan and Recommendations:  1. Referral:  - Referral to an ophthalmologist with expertise in advanced dry eye management and in-office procedures (e.g., punctal plugs, thermal pulsation, intense pulsed light, meibomian gland expression) is  recommended for further evaluation and procedural intervention.[1][2][3]  2. Interim Symptom Management:  - Lifitegrast  (Xiidra ) 5% ophthalmic solution: Initiate one drop in each eye twice daily. This agent is FDA-approved for dry eye disease and has demonstrated efficacy in patients with moderate to severe symptoms, including those refractory to cyclosporine .[4][5]  - Short-term topical corticosteroids: Consider a limited course of a low-potency, preservative-free corticosteroid (e.g., loteprednol etabonate 0.25%) for rapid relief of inflammation and pain, with close monitoring for intraocular pressure elevation and other adverse effects.[1][2][3]  - Supportive measures: Continue frequent use of preservative-free artificial tears and consider nighttime use of lubricating ointments for additional comfort.[5]  - Environmental and lifestyle modifications: Advise use of wraparound sunglasses outdoors, avoidance of direct air flow (fans, vents), and regular blinking, especially during screen use.[1]  - Pain and photophobia: For severe photophobia, recommend tinted lenses or moisture chamber glasses as adjunctive measures. If neuropathic pain is suspected, further evaluation by a cornea specialist is warranted.[1][5]  3. Additional Considerations:  - If symptoms remain refractory, advanced therapies such as autologous serum tears or amniotic membrane grafts may be considered by the specialist.[1][6]  - Monitor for secondary complications (e.g., infection, corneal epithelial breakdown) and reassess if new symptoms develop.  Follow-Up:  - Follow up with the referred ophthalmologist for procedural evaluation and ongoing management.  - Return to clinic sooner if there is worsening pain,  vision changes, or signs of infection.  Patient Education:  - Chronic dry eye and photosensitivity after cataract surgery are recognized complications, particularly in patients with pre-existing ocular surface  disease.[7][8]  - Escalation to procedural and advanced therapies is evidence-based and appropriate for refractory cases.[1][2][3]  - Adherence to prescribed therapies and environmental modifications is essential for symptom control and ocular surface health.  Medications Prescribed:  - Lifitegrast  (Xiidra ) 5% ophthalmic solution: 1 drop in each eye twice daily.  - Preservative-free artificial tears: as needed, up to hourly.  - Lubricating ointment: at bedtime, as needed.  - (If prescribed) Loteprednol etabonate 0.25% ophthalmic suspension: 1 drop in each eye twice daily for 2-4 weeks, then reassess.  ICD-10 Codes:  - H04.123 (Dry eye syndrome of bilateral lacrimal glands)  - H53.19 (Other subjective visual disturbances - for photosensitivity)  - Z98.4 (Presence of intraocular lens)  References:[1][2][7][5][4][6][3][8] References TFOS DEWS III Management and Therapy Report. Joshua LITTIE Lorrene ROEL Melodie CHRISTELLA, et al. American Journal of Ophthalmology. 2025;:S0002-9394(25)00274-0. doi:10.1016/j.ajo.2025.05.039. Interventions for Dry Eye: An Overview of Systematic Reviews. Lucienne SHAUNNA Dyanne JENEANE Wilfred GORMAN et al. JAMA Ophthalmology. 2024;142(1):58-74. doi:10.1001/jamaophthalmol.2023.5751. Preventing and Managing Iatrogenic Dry Eye Disease During the Entire Surgical Pathway: A Study Focusing on Patients Undergoing Cataract Surgery. Giannaccare G, Barabino S, Di Zazzo A, Villani E. Journal of Clinical Medicine. 2024;13(3):748. doi:10.3390/jcm13030748. FDA Orange Book. FDA Orange Book. Dry Eye. Ivonne SHILLING. The [place] Journal of Medicine. 2018;378(23):2212-2223. doi:10.1056/[id number]. The Effect of Autologous Serum Eye Drops Combined With Sodium Hyaluronate Eye Drops in the Treatment of Dry Eye Syndrome After Cataract Surgery. Cena LITTIE Alena CINDERELLA Lajoyce OLEGARIO Maurine JULIANNA Allana JENEANE. Medicine. 2025;104(33):e43833. doi:10.1097/MD.0000000000043833. Mechanisms and Management of Dry Eye in Cataract Surgery Patients.  Sutu C, [place], Afshari [place]. Current Opinion in Ophthalmology. 2016;27(1):24-30. doi:10.1097/ICU.0000000000000227. Discrepancies in Persistent Dry Eye Signs and Symptoms in Bilateral Pseudophakic Patients. Hanyuda A, Ayaki M, Tsubota K, Negishi K. Journal of Clinical Medicine. 2019;8(2):E211. doi:10.3390/jcm8020211.

## 2023-12-05 NOTE — Assessment & Plan Note (Addendum)
 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 Symptoms include excruciating eye pain exacerbated by exposure to light from sources such as fluorescent lights, computer screens, TV, and sunlight. Restasis  has been ineffective, and bromfenac  sodium caused burning and was discontinued. Fioricet was prescribed but not tried. The current theory suggests that dry eyes are damaging the outer layer of the eyes, increasing photosensitivity and pain. Aggressive treatment of dry eyes is necessary for healing and symptom reduction. Prescribe Xiidra  (Lifitegrast ) drops to be used with or as a replacement for Restasis . Refer to an ophthalmologist for procedures like intense pulse light therapy, LipiFlow, or punctal plugs. Increase the use of eye ointments to prevent eyelid sticking and further damage. Encourage regular use of artificial tears for lubrication. Advise discontinuation of Restasis  if ineffective. Consider ibuprofen for pain management, ensuring a four-hour gap between doses if also taking acetaminophen for knee pain.  This patient presents with persistent, severe dry eye symptoms and extreme photosensitivity following cataract replacement, with progressive worsening over 1.2 years. Despite appropriate first-line management--including topical cyclosporine  (Restasis ) and multiple artificial tear formulations--there has been no clinically meaningful improvement. The patient continues to experience significant ocular pain and photophobia, which are impacting quality of life and daily functioning.  I theorize the failure to improve is due to ongoing dry eye induced epithelial damage. Post-cataract dry eye is well-documented and may persist or worsen in a subset of patients, particularly those with pre-existing ocular surface  disease or meibomian gland dysfunction. The American Academy of Ophthalmology recommends thorough evaluation and escalation of care for patients with refractory symptoms after cataract surgery.  Persistent photosensitivity and pain in the context of severe dry eye may indicate ongoing ocular surface inflammation, tear film instability, or neuropathic pain, all of which warrant subspecialty evaluation and procedural intervention. TFOS DEWS III Management and Therapy Report provides a consensus-based algorithm for dry eye management, emphasizing escalation to in-office procedures (punctal plugs, thermal pulsation, intense pulsed light, meibomian gland expression) and advanced therapies (autologous serum tears, amniotic membrane grafts) for patients unresponsive to topical agents.

## 2023-12-06 DIAGNOSIS — M1711 Unilateral primary osteoarthritis, right knee: Secondary | ICD-10-CM | POA: Diagnosis not present

## 2023-12-07 NOTE — Telephone Encounter (Unsigned)
 Copied from CRM #[id number]. Topic: Clinical - Medication Refill >> Dec 07, 2023  1:04 PM Alfonso HERO wrote: Medication: Lifitegrast  (XIIDRA ) 5 % SOLN  Has the patient contacted their pharmacy? Yes (Agent: If no, request that the patient contact the pharmacy for the refill. If patient does not wish to contact the pharmacy document the reason why and proceed with request.) (Agent: If yes, when and what did the pharmacy advise?)  This is the patient's preferred pharmacy:  [facility] [address] [place] - [address] [place] [address] Phone: [phone] Fax: [phone]  Is this the correct pharmacy for this prescription? Yes If no, delete pharmacy and type the correct one.   Has the prescription been filled recently? No  Is the patient out of the medication? Yes  Has the patient been seen for an appointment in the last year OR does the patient have an upcoming appointment? Yes  Can we respond through MyChart? Yes  Agent: Please be advised that Rx refills may take up to 3 business days. We ask that you follow-up with your pharmacy.

## 2023-12-08 DIAGNOSIS — M1711 Unilateral primary osteoarthritis, right knee: Secondary | ICD-10-CM | POA: Diagnosis not present

## 2023-12-13 DIAGNOSIS — M1711 Unilateral primary osteoarthritis, right knee: Secondary | ICD-10-CM | POA: Diagnosis not present

## 2023-12-14 ENCOUNTER — Telehealth: Payer: Self-pay

## 2023-12-14 ENCOUNTER — Other Ambulatory Visit (HOSPITAL_COMMUNITY): Payer: Self-pay

## 2023-12-14 NOTE — Telephone Encounter (Signed)
 Pharmacy Patient Advocate Encounter  Received notification from [facility] that Prior Authorization for Xiidra  5% solution  has been APPROVED from 12/14/23 to 12/13/24. Ran test claim, Copay is $45. This test claim was processed through [facility]- copay amounts may vary at other pharmacies due to pharmacy/plan contracts, or as the patient moves through the different stages of their insurance plan.   PA #/Case ID/Reference #: [id number]

## 2023-12-14 NOTE — Telephone Encounter (Signed)
 Pharmacy Patient Advocate Encounter   Received notification from Onbase that prior authorization for Xiidra  5% solution  is required/requested.   Insurance verification completed.   The patient is insured through [employer].   Per test claim: PA required; PA submitted to above mentioned insurance via Latent Key/confirmation #/EOC [id number] Status is pending

## 2023-12-14 NOTE — Telephone Encounter (Signed)
 Copied from CRM [id number]. Topic: Clinical - Medication Question >> Dec 14, 2023  9:49 AM Drema MATSU wrote: Reason for CRM: Jerel wants to let pcp know that Lifitegrast  (XIIDRA ) 5 % SOLN has been approved. Starting 12/14/2023-12/13/2024  noted

## 2023-12-15 ENCOUNTER — Other Ambulatory Visit: Payer: Self-pay | Admitting: Internal Medicine

## 2023-12-15 DIAGNOSIS — I10 Essential (primary) hypertension: Secondary | ICD-10-CM

## 2023-12-15 DIAGNOSIS — M1711 Unilateral primary osteoarthritis, right knee: Secondary | ICD-10-CM | POA: Diagnosis not present

## 2023-12-17 ENCOUNTER — Other Ambulatory Visit: Payer: Self-pay | Admitting: Internal Medicine

## 2023-12-17 DIAGNOSIS — J4540 Moderate persistent asthma, uncomplicated: Secondary | ICD-10-CM

## 2023-12-19 DIAGNOSIS — H04123 Dry eye syndrome of bilateral lacrimal glands: Secondary | ICD-10-CM | POA: Diagnosis not present

## 2023-12-19 DIAGNOSIS — H538 Other visual disturbances: Secondary | ICD-10-CM | POA: Diagnosis not present

## 2023-12-19 DIAGNOSIS — H53143 Visual discomfort, bilateral: Secondary | ICD-10-CM | POA: Diagnosis not present

## 2023-12-19 DIAGNOSIS — H53451 Other localized visual field defect, right eye: Secondary | ICD-10-CM | POA: Diagnosis not present

## 2023-12-19 LAB — OPHTHALMOLOGY REPORT-SCANNED

## 2023-12-20 DIAGNOSIS — M1711 Unilateral primary osteoarthritis, right knee: Secondary | ICD-10-CM | POA: Diagnosis not present

## 2023-12-22 DIAGNOSIS — M1711 Unilateral primary osteoarthritis, right knee: Secondary | ICD-10-CM | POA: Diagnosis not present

## 2023-12-27 DIAGNOSIS — M9903 Segmental and somatic dysfunction of lumbar region: Secondary | ICD-10-CM | POA: Diagnosis not present

## 2023-12-27 DIAGNOSIS — M1711 Unilateral primary osteoarthritis, right knee: Secondary | ICD-10-CM | POA: Diagnosis not present

## 2023-12-29 DIAGNOSIS — M1711 Unilateral primary osteoarthritis, right knee: Secondary | ICD-10-CM | POA: Diagnosis not present

## 2023-12-30 DIAGNOSIS — H53143 Visual discomfort, bilateral: Secondary | ICD-10-CM | POA: Diagnosis not present

## 2023-12-30 DIAGNOSIS — H538 Other visual disturbances: Secondary | ICD-10-CM | POA: Diagnosis not present

## 2024-01-02 ENCOUNTER — Other Ambulatory Visit (HOSPITAL_COMMUNITY): Payer: Self-pay

## 2024-01-03 ENCOUNTER — Ambulatory Visit: Payer: Medicare Other

## 2024-01-03 VITALS — Ht 69.0 in | Wt 180.0 lb

## 2024-01-03 DIAGNOSIS — Z0000 Encounter for general adult medical examination without abnormal findings: Secondary | ICD-10-CM

## 2024-01-03 DIAGNOSIS — M1711 Unilateral primary osteoarthritis, right knee: Secondary | ICD-10-CM | POA: Diagnosis not present

## 2024-01-03 DIAGNOSIS — M9903 Segmental and somatic dysfunction of lumbar region: Secondary | ICD-10-CM | POA: Diagnosis not present

## 2024-01-03 DIAGNOSIS — M51361 Other intervertebral disc degeneration, lumbar region with lower extremity pain only: Secondary | ICD-10-CM | POA: Diagnosis not present

## 2024-01-03 NOTE — Patient Instructions (Signed)
 Jay Fox,  Thank you for taking the time for your Medicare Wellness Visit. I appreciate your continued commitment to your health goals. Please review the care plan we discussed, and feel free to reach out if I can assist you further.  Medicare recommends these wellness visits once per year to help you and your care team stay ahead of potential health issues. These visits are designed to focus on prevention, allowing your provider to concentrate on managing your acute and chronic conditions during your regular appointments.  Please note that Annual Wellness Visits do not include a physical exam. Some assessments may be limited, especially if the visit was conducted virtually. If needed, we may recommend a separate in-person follow-up with your provider.  Ongoing Care Seeing your primary care provider every 3 to 6 months helps us  monitor your health and provide consistent, personalized care.   Referrals If a referral was made during today's visit and you haven't received any updates within two weeks, please contact the referred provider directly to check on the status.  Recommended Screenings:  Health Maintenance  Topic Date Due   Zoster (Shingles) Vaccine (1 of 2) 07/11/1970   Pneumococcal Vaccine for age over 40 (2 of 2 - PCV) 07/05/2018   COVID-19 Vaccine (5 - 2025-26 season) 11/14/2023   Medicare Annual Wellness Visit  12/28/2023   DTaP/Tdap/Td vaccine (3 - Td or Tdap) 05/13/2027   Colon Cancer Screening  06/06/2032   Flu Shot  Completed   Hepatitis C Screening  Completed   Meningitis B Vaccine  Aged Out       07/08/2023   10:32 AM  Advanced Directives  Does Patient Have a Medical Advance Directive? No   Advance Care Planning is important because it: Ensures you receive medical care that aligns with your values, goals, and preferences. Provides guidance to your family and loved ones, reducing the emotional burden of decision-making during critical moments.  Vision: Annual  vision screenings are recommended for early detection of glaucoma, cataracts, and diabetic retinopathy. These exams can also reveal signs of chronic conditions such as diabetes and high blood pressure.  Dental: Annual dental screenings help detect early signs of oral cancer, gum disease, and other conditions linked to overall health, including heart disease and diabetes.  Please see the attached documents for additional preventive care recommendations.

## 2024-01-03 NOTE — Progress Notes (Signed)
 Subjective:   Jay Fox is a 73 y.o. who presents for a Medicare Wellness preventive visit.  As a reminder, Annual Wellness Visits don't include a physical exam, and some assessments may be limited, especially if this visit is performed virtually. We may recommend an in-person follow-up visit with your provider if needed.  Visit Complete: Virtual I connected with  Jay Fox on 01/03/24 by a audio enabled telemedicine application and verified that I am speaking with the correct person using two identifiers.  Patient Location: Home  Provider Location: Office/Clinic  I discussed the limitations of evaluation and management by telemedicine. The patient expressed understanding and agreed to proceed.  Vital Signs: Because this visit was a virtual/telehealth visit, some criteria may be missing or patient reported. Any vitals not documented were not able to be obtained and vitals that have been documented are patient reported.  VideoDeclined- This patient declined Librarian, academic. Therefore the visit was completed with audio only.  Persons Participating in Visit: Patient.  AWV Questionnaire: Yes: Patient Medicare AWV questionnaire was completed by the patient on 12/30/23; I have confirmed that all information answered by patient is correct and no changes since this date.  Cardiac Risk Factors include: advanced age (>45men, >37 women);dyslipidemia;hypertension;male gender     Objective:    Today's Vitals   01/03/24 0943  Weight: 180 lb (81.6 kg)  Height: 5' 9 (1.753 m)   Body mass index is 26.58 kg/m.     01/03/2024    9:51 AM 07/08/2023   10:32 AM 04/28/2023   10:02 AM 02/03/2023    9:19 AM 12/28/2022   10:11 AM 09/03/2022    8:55 AM 03/03/2022    8:59 AM  Advanced Directives  Does Patient Have a Medical Advance Directive? Yes No No Yes Yes Yes Yes  Type of Estate agent of [place];Living will   Healthcare Power of  [place] of [place];Living will Healthcare Power of [place];Out of facility DNR (pink MOST or yellow form);Living will Healthcare Power of [place];Out of facility DNR (pink MOST or yellow form);Living will  Does patient want to make changes to medical advance directive? No - Patient declined    No - Patient declined    Copy of Healthcare Power of Attorney in Chart? Yes - validated most recent copy scanned in chart (See row information)    Yes - validated most recent copy scanned in chart (See row information)      Current Medications (verified) Outpatient Encounter Medications as of 01/03/2024  Medication Sig   albuterol  (VENTOLIN  HFA) 108 (90 Base) MCG/ACT inhaler Inhale 2 puffs into the lungs every 6 (six) hours as needed for wheezing or shortness of breath.   amLODipine  (NORVASC ) 10 MG tablet Take 1 tablet by mouth once daily   Ascorbic Acid (VITAMIN C PO) Take 1 tablet by mouth once. 500mg    aspirin  EC 81 MG tablet Take 1 tablet (81 mg total) by mouth daily. Swallow whole.   BREYNA  160-4.5 MCG/ACT inhaler Inhale 2 puffs by mouth twice daily   butalbital -acetaminophen-caffeine  (FIORICET) 50-325-40 MG tablet Take 1 tablet by mouth every 6 (six) hours as needed for headache.   calcium  carbonate (TUMS - DOSED IN MG ELEMENTAL CALCIUM ) 500 MG chewable tablet Chew 1 tablet by mouth as needed.   Calcium  Carbonate-Vit D-Min (CALCIUM  1200 PO) Take 1 tablet by mouth daily.   Cholecalciferol (VITAMIN D3) 50 MCG (2000 UT) TABS Take 1 tablet by mouth daily.   citalopram  (CELEXA )  10 MG tablet Take 10 mg by mouth daily.   cycloSPORINE  (RESTASIS ) 0.05 % ophthalmic emulsion INSTILL 1 DROP INTO EACH EYE TWICE DAILY   cycloSPORINE  (RESTASIS ) 0.05 % ophthalmic emulsion 1 drop 2 (two) times daily.   fluticasone  (FLONASE ) 50 MCG/ACT nasal spray Place 2 sprays into both nostrils daily.   irbesartan  (AVAPRO ) 300 MG tablet Take 1 tablet by mouth once daily   Multiple Vitamin (MULTIVITAMIN) tablet  Take 1 tablet by mouth daily.   Omega-3 Fatty Acids (FISH OIL) 1200 MG CAPS Take 1,200 mg by mouth in the morning and at bedtime.   Polyethyl Glycol-Propyl Glycol (SYSTANE FREE OP) Apply to eye.   pregabalin  (LYRICA ) 200 MG capsule Take 1 capsule (200 mg total) by mouth at bedtime.   Respiratory Therapy Supplies MISC Filters Hoses tubing masks headgear cleaning supplies for dreamwear and  philips respironics bipap machine.   rosuvastatin  (CRESTOR ) 20 MG tablet Take 1 tablet (20 mg total) by mouth daily.   Saline (SIMPLY SALINE) 0.9 % AERS Place 2 each into the nose as directed. Use nightly for sinus hygiene long-term.  Can also be used as many times daily as desired to assist with clearing congested sinuses.   Spacer/Aero-Holding Chambers (AEROCHAMBER MV) inhaler Use as directed with albuterol  inhaler.   TURMERIC PO Take 1,000 mg by mouth in the morning and at bedtime.   venlafaxine  (EFFEXOR ) 100 MG tablet Take 1 tablet (100 mg total) by mouth daily.   vitamin B-12 (CYANOCOBALAMIN) 500 MCG tablet Take 1 tablet by mouth daily.   zolpidem  (AMBIEN ) 10 MG tablet Take 1 tablet (10 mg total) by mouth at bedtime as needed for sleep. Can NOT take with opioid pain medicine, alcohol, or other sedative.  Risk dementia/mornings impaired alertness   sildenafil (REVATIO) 20 MG tablet 1 tablet Orally Once a day (Patient not taking: Reported on 12/05/2023)   [DISCONTINUED] diclofenac Sodium (VOLTAREN) 1 % GEL Place onto the skin.   [DISCONTINUED] erythromycin  ophthalmic ointment Place 1 Application into both eyes at bedtime.   [DISCONTINUED] Lifitegrast  (XIIDRA ) 5 % SOLN Apply 1 each to eye 2 (two) times daily.   [DISCONTINUED] loratadine  (CLARITIN ) 10 MG tablet Take 1 tablet (10 mg total) by mouth daily.   No facility-administered encounter medications on file as of 01/03/2024.    Allergies (verified) Shellfish allergy and Shellfish protein-containing drug products   History: Past Medical History:   Diagnosis Date   Allergy 1953   Altered olfactory perception 05/31/2022   Smells cigarettes sometimes     Anxiety    Arthritis    Asthma    BPH (benign prostatic hypertrophy)    BPH (benign prostatic hypertrophy)    Cancer (HCC)    Cataract    Chronic kidney disease    Depression    GERD (gastroesophageal reflux disease)    History of kidney cancer    History of prostate cancer 11/30/2021   Hyperlipidemia 12/08/2022   Hypertension    Leg swelling 11/30/2021   Noticed early 2023 with ferritin deposition.    OSA (obstructive sleep apnea) 11/30/2021   Restless legs 11/30/2021   Seasonal allergies    Sleep apnea    Past Surgical History:  Procedure Laterality Date   CATARACT EXTRACTION, BILATERAL Bilateral 11/2022   10/24 left   NASAL SINUS SURGERY  2019   RENAL CRYOABLATION     TONSILLECTOMY     Family History  Problem Relation Age of Onset   Emphysema Mother    Liver cancer Mother  Pancreatic cancer Mother    Anxiety disorder Mother    Cancer Mother    Hypertension Mother    Multiple myeloma Father    Cancer Father    Hypertension Father    Cancer Paternal Grandmother    Colon cancer Paternal Grandmother    Cancer Paternal Grandfather    Colon cancer Paternal Grandfather    Social History   Socioeconomic History   Marital status: Married    Spouse name: Not on file   Number of children: 3   Years of education: 12   Highest education level: 12th grade  Occupational History   Occupation: Retired  Tobacco Use   Smoking status: Never   Smokeless tobacco: Never  Vaping Use   Vaping status: Never Used  Substance and Sexual Activity   Alcohol use: Yes    Alcohol/week: 10.0 standard drinks of alcohol    Types: 10 Standard drinks or equivalent per week    Comment: Varies sometimes not at all   Drug use: No   Sexual activity: Not Currently  Other Topics Concern   Not on file  Social History Narrative   Right handed   Caffeine  none   Lives in one  story home   Retired    Lives with wife   Social Drivers of Corporate investment banker Strain: Low Risk  (12/30/2023)   Overall Financial Resource Strain (CARDIA)    Difficulty of Paying Living Expenses: Not hard at all  Food Insecurity: No Food Insecurity (12/30/2023)   Hunger Vital Sign    Worried About Running Out of Food in the Last Year: Never true    Ran Out of Food in the Last Year: Never true  Transportation Needs: No Transportation Needs (12/30/2023)   PRAPARE - Administrator, Civil Service (Medical): No    Lack of Transportation (Non-Medical): No  Physical Activity: Unknown (12/30/2023)   Exercise Vital Sign    Days of Exercise per Week: Patient declined    Minutes of Exercise per Session: Not on file  Recent Concern: Physical Activity - Insufficiently Active (10/14/2023)   Exercise Vital Sign    Days of Exercise per Week: 4 days    Minutes of Exercise per Session: 30 min  Stress: Stress Concern Present (12/30/2023)   [employer] of Occupational Health - Occupational Stress Questionnaire    Feeling of Stress: To some extent  Social Connections: Socially Integrated (12/30/2023)   Social Connection and Isolation Panel    Frequency of Communication with Friends and Family: Twice a week    Frequency of Social Gatherings with Friends and Family: Once a week    Attends Religious Services: More than 4 times per year    Active Member of [employer] or Organizations: Yes    Attends Banker Meetings: 1 to 4 times per year    Marital Status: Married    Tobacco Counseling Counseling given: Not Answered    Clinical Intake:  Pre-visit preparation completed: Yes  Pain : 0-10 Pain Type: Other (Comment) (knee replacement) Pain Location: Knee Pain Orientation: Right Pain Descriptors / Indicators: Aching, Sharp Pain Onset: More than a month ago Pain Frequency: Intermittent     BMI - recorded: 26.58 Nutritional Status: BMI 25 -29  Overweight Nutritional Risks: None Diabetes: No  Lab Results  Component Value Date   HGBA1C 5.9 12/08/2022   HGBA1C 6.0 12/01/2021   HGBA1C 5.9 05/14/2021     How often do you need to have someone help  you when you read instructions, pamphlets, or other written materials from your doctor or pharmacy?: 1 - Never  Interpreter Needed?: No  Information entered by :: Ellouise Haws, LPN   Activities of Daily Living     12/30/2023    1:48 PM  In your present state of health, do you have any difficulty performing the following activities:  Hearing? 0  Vision? 0  Difficulty concentrating or making decisions? 0  Walking or climbing stairs? 0  Dressing or bathing? 0  Doing errands, shopping? 0  Preparing Food and eating ? N  Using the Toilet? N  In the past six months, have you accidently leaked urine? Y  Comment wears a pad  Do you have problems with loss of bowel control? N  Managing your Medications? N  Managing your Finances? N  Housekeeping or managing your Housekeeping? N    Patient Care Team: Jesus [name], MD as PCP - General (Internal Medicine) Brien Belvie BRAVO, MD as Attending Physician (Pulmonary Disease) Leigh Venetia CROME, MD as Consulting Physician (Neurology)  I have updated your Care Teams any recent Medical Services you may have received from other providers in the past year.     Assessment:   This is a routine wellness examination for Jay Fox.  Hearing/Vision screen Hearing Screening - Comments:: Pt denies any hearing issues  Vision Screening - Comments:: Wears rx glasses - up to date with routine eye exams with Dr octavia will follow up with new provider for eye care    Goals Addressed             This Visit's Progress    Patient Stated       Working on getting knee better        Depression Screen     01/03/2024    9:51 AM 08/04/2023    1:16 PM 01/18/2023   11:16 AM 12/28/2022   10:11 AM 12/08/2022   10:00 AM 05/31/2022    9:08 AM 12/22/2021     8:03 AM  PHQ 2/9 Scores  PHQ - 2 Score 0 1 4 1  0 2 0  PHQ- 9 Score  1 11 8 6 4      Fall Risk     12/30/2023    1:48 PM 07/08/2023   10:32 AM 04/28/2023   10:02 AM 02/03/2023    9:19 AM 01/18/2023   11:16 AM  Fall Risk   Falls in the past year? 0 0 0 0 0  Number falls in past yr: 0 0 0 0 0  Injury with Fall? 0 0 0 0 0  Risk for fall due to : No Fall Risks    No Fall Risks  Follow up Falls prevention discussed Falls evaluation completed Falls evaluation completed Falls evaluation completed Falls evaluation completed    MEDICARE RISK AT HOME:  Medicare Risk at Home Any stairs in or around the home?: (Patient-Rptd) No If so, are there any without handrails?: (Patient-Rptd) No Home free of loose throw rugs in walkways, pet beds, electrical cords, etc?: (Patient-Rptd) No Adequate lighting in your home to reduce risk of falls?: (Patient-Rptd) Yes Life alert?: (Patient-Rptd) No Use of a cane, walker or w/c?: (Patient-Rptd) No Grab bars in the bathroom?: (Patient-Rptd) Yes Shower chair or bench in shower?: (Patient-Rptd) No Elevated toilet seat or a handicapped toilet?: (Patient-Rptd) No  TIMED UP AND GO:  Was the test performed?  No  Cognitive Function: 6CIT completed    12/28/2022   10:16 AM  MMSE -  Mini Mental State Exam  Not completed: Unable to complete        01/03/2024    9:53 AM 12/22/2021    8:07 AM  6CIT Screen  What Year? 0 points 0 points  What month? 0 points 0 points  What time? 0 points 0 points  Count back from 20 0 points 0 points  Months in reverse 0 points 0 points  Repeat phrase 0 points 0 points  Total Score 0 points 0 points    Immunizations Immunization History  Administered Date(s) Administered   Fluad Quad(high Dose 65+) 11/21/2019   INFLUENZA, HIGH DOSE SEASONAL PF 12/01/2022, 12/05/2023   Influenza Whole 12/11/2010   Influenza,inj,Quad PF,6+ Mos 12/01/2021   Influenza,trivalent, recombinat, inj, PF 12/11/2010, 12/28/2011, 12/15/2012    Influenza-Unspecified 12/01/2021   Moderna Covid-19 Fall Seasonal Vaccine 58yrs & older 02/18/2022, 12/01/2022   Moderna Covid-19 Vaccine Bivalent Booster 47yrs & up 05/15/2019, 06/12/2019, 01/19/2021   Moderna SARS-COV2 Booster Vaccination 01/16/2020, 07/10/2020, 01/19/2021   Pneumococcal Polysaccharide-23 12/28/2011   Pneumococcal-Unspecified 07/04/2017   Tdap 07/05/2014, 05/12/2017   Zoster, Live 07/03/2014   Zoster, Unspecified 05/15/2017, 08/13/2017    Screening Tests Health Maintenance  Topic Date Due   Zoster Vaccines- Shingrix (1 of 2) 07/11/1970   Pneumococcal Vaccine: 50+ Years (2 of 2 - PCV) 07/05/2018   COVID-19 Vaccine (5 - 2025-26 season) 11/14/2023   Medicare Annual Wellness (AWV)  01/02/2025   DTaP/Tdap/Td (3 - Td or Tdap) 05/13/2027   Colonoscopy  06/06/2032   Influenza Vaccine  Completed   Hepatitis C Screening  Completed   Meningococcal B Vaccine  Aged Out    Health Maintenance Items Addressed: Vaccines Due: and discussed , See Nurse Notes at the end of this note  Additional Screening:  Vision Screening: Recommended annual ophthalmology exams for early detection of glaucoma and other disorders of the eye. Is the patient up to date with their annual eye exam?  Yes  Who is the provider or what is the name of the office in which the patient attends annual eye exams? Dr Octavia will seek a new provider   Dental Screening: Recommended annual dental exams for proper oral hygiene  Community Resource Referral / Chronic Care Management: CRR required this visit?  No   CCM required this visit?  No   Plan:    I have personally reviewed and noted the following in the patient's chart:   Medical and social history Use of alcohol, tobacco or illicit drugs  Current medications and supplements including opioid prescriptions. Patient is not currently taking opioid prescriptions. Functional ability and status Nutritional status Physical activity Advanced  directives List of other physicians Hospitalizations, surgeries, and ER visits in previous 12 months Vitals Screenings to include cognitive, depression, and falls Referrals and appointments  In addition, I have reviewed and discussed with patient certain preventive protocols, quality metrics, and best practice recommendations. A written personalized care plan for preventive services as well as general preventive health recommendations were provided to patient.   Ellouise VEAR Haws, LPN   [id number]   After Visit Summary: (MyChart) Due to this being a telephonic visit, the after visit summary with patients personalized plan was offered to patient via MyChart   Notes: Nothing significant to report at this time.

## 2024-01-05 ENCOUNTER — Encounter: Payer: Self-pay | Admitting: Internal Medicine

## 2024-01-05 ENCOUNTER — Ambulatory Visit: Admitting: Internal Medicine

## 2024-01-05 VITALS — BP 120/62 | HR 72 | Temp 98.0°F | Ht 69.0 in | Wt 185.8 lb

## 2024-01-05 DIAGNOSIS — R739 Hyperglycemia, unspecified: Secondary | ICD-10-CM

## 2024-01-05 DIAGNOSIS — H04123 Dry eye syndrome of bilateral lacrimal glands: Secondary | ICD-10-CM | POA: Diagnosis not present

## 2024-01-05 DIAGNOSIS — Z79899 Other long term (current) drug therapy: Secondary | ICD-10-CM

## 2024-01-05 DIAGNOSIS — Z96651 Presence of right artificial knee joint: Secondary | ICD-10-CM | POA: Insufficient documentation

## 2024-01-05 DIAGNOSIS — H3589 Other specified retinal disorders: Secondary | ICD-10-CM

## 2024-01-05 DIAGNOSIS — E785 Hyperlipidemia, unspecified: Secondary | ICD-10-CM

## 2024-01-05 DIAGNOSIS — L568 Other specified acute skin changes due to ultraviolet radiation: Secondary | ICD-10-CM

## 2024-01-05 DIAGNOSIS — I10 Essential (primary) hypertension: Secondary | ICD-10-CM | POA: Diagnosis not present

## 2024-01-05 DIAGNOSIS — Z8546 Personal history of malignant neoplasm of prostate: Secondary | ICD-10-CM

## 2024-01-05 DIAGNOSIS — M1711 Unilateral primary osteoarthritis, right knee: Secondary | ICD-10-CM | POA: Diagnosis not present

## 2024-01-05 HISTORY — DX: Presence of right artificial knee joint: Z96.651

## 2024-01-05 NOTE — Assessment & Plan Note (Signed)
 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 Symptoms include severe photosensitivity, eye pain, and headaches. Recent ERG test showed severe reduced amplitude in flicker cone-driven response in the left eye, indicating significant retinal dysfunction. 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 Continue Restasis  for local immunosuppression. Minimize light exposure to prevent symptom progression. Follow up with the dry eye clinic and neuro-ophthalmologist at [facility] for further evaluation and management. Discuss potential systemic immunosuppression with specialists at [facility]. Avoid using erythromycin  ophthalmic ointment. Dry eye syndrome   Chronic dry eye syndrome persists despite aggressive use of artificial tears. Continue Restasis  twice daily and use non-preservative artificial tears as needed. Ensure follow-up appointment with the dry eye clinic.   Reviewed [employer] reports on his erg testing with the patient:   Most Likely Diagnoses:  1. Operating microscope-induced retinal phototoxicity: The combination of severe bilateral eye pain, photophobia, and panphotoreceptor dysfunction on electroretinogram (ERG) after cataract surgery is highly suggestive of retinal phototoxicity from surgical microscope light exposure. This is especially likely if the surgery was prolonged or involved combined procedures. Phototoxic maculopathy can present with decreased vision, pain, and RPE changes, and is more common after complex or lengthy intraocular surgeries. ERG findings of panphotoreceptor dysfunction support diffuse retinal injury rather than isolated macular edema or anterior segment pathology.[1]  2. Drug or preservative-induced retinal toxicity: Intracameral antibiotics (e.g.,  moxifloxacin, cefuroxime, vancomycin) used during cataract surgery have been associated with retinal toxicity, especially if dosing errors occur. These agents can cause direct retinal vascular and neuronal injury, leading to panretinal dysfunction and severe visual symptoms. Bilateral involvement would be unusual but possible if the same agent or preparation was used in both eyes.[2]  3. Toxic anterior segment syndrome (TASS): TASS typically presents with acute, severe anterior segment inflammation within 12-48 hours postoperatively, but it is usually limited to the anterior segment and does not cause panphotoreceptor dysfunction on ERG. However, severe cases or associated toxic agents could theoretically extend posteriorly.[3]  4. Cystoid macular edema (Irvine-Gass syndrome): CME is a common cause of visual loss and photophobia after cataract surgery, but it rarely causes severe pain or panphotoreceptor dysfunction on ERG. 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 It can cause severe pain, photophobia, and diffuse retinal dysfunction. Early recognition is critical, as prompt systemic immunosuppression can preserve vision. OCT and clinical exam for granulomatous uveitis are key for diagnosis.[5]  2. Acute postoperative infectious endophthalmitis: Presents with severe pain, decreased vision, and intraocular inflammation, but typically shows hypopyon and vitritis rather than isolated panphotoreceptor dysfunction. Rapid progression and purulent discharge are classic.  3. Acute angle-closure glaucoma: Can cause severe pain and photophobia postoperatively, but is usually associated with elevated intraocular pressure and corneal edema, not panphotoreceptor dysfunction.  Key Additional History and Follow-up  Tests:  - Detailed surgical history: Duration of surgery, use of microscope, intraoperative complications, and specific drugs/antibiotics used.  - Slit-lamp and fundus examination: To assess for anterior segment inflammation, retinal changes, or signs of infection.  - OCT and fluorescein angiography: To evaluate for CME, retinal pigment epithelium changes, or choroidal involvement.  - Review of intraoperative medications and instrument sterilization protocols: To assess for possible toxic exposures.  -  Systemic review for malignancy or autoimmune symptoms: If paraneoplastic or autoimmune retinopathy is suspected.  In summary, retinal phototoxicity and drug-induced retinal toxicity are the leading considerations given the bilateral, severe, and panretinal nature of dysfunction after cataract surgery, but sympathetic ophthalmia and infection must be actively ruled out.[1][2][5]   Would you like me to review the detailed intraoperative records--including microscope light settings, duration of exposure, and all medications or solutions used during surgery--to help pinpoint any potential sources of retinal toxicity or photic injury? References Operating Microscope Light-Induced Phototoxic Maculopathy After Transscleral Sutured Posterior Chamber Intraocular Lens Implantation. Kweon EY, Ahn M, Lee DW, et al. [facility] [place] 2009 Nov-Dec;29(10):1491-5. doi:10.1097/[id number]. Toxicities of and Inflammatory Responses to Moxifloxacin, Cefuroxime, and Vancomycin on Retinal Vascular Cells. Areta VEAR Edwena JONETTA Arch CINDERELLA, et al. Scientific Reports. 2019;9(1):9745. doi:10.1038/[id number]. Toxic Anterior Segment Syndrome (TASS): A Review and Update. Lonnie LITTIE Gershon DELENA, Maharana PK, Donnice ONEIDA Frutoso LOISE. [place] Journal of Ophthalmology. 2024;72(1):11-18. doi:10.4103/[email or web address]. Current Management Options in [place]vine-Gass Syndrome: A Systemized Review. Yolonda CHRISTELLA Cleda [place] Journal of  Clinical Medicine. 2021;10(19):4375. doi:10.3390/[id number]. Recent Advances in Diagnosis and Management of Sympathetic Ophthalmia. Fromal OV, Swaminathan V, Soares Washburn, Ho AC. Current Opinion in Ophthalmology. 2021;32(6):555-560. doi:10.1097/ICU.[id number].

## 2024-01-05 NOTE — Assessment & Plan Note (Signed)
 Routine health maintenance discussed, including the need for annual blood work and prostate cancer screening. Schedule annual physical with fasting blood work next month. Coordinate PSA test with [facility]. Consider CT scan for kidney monitoring due to renal cell carcinoma.  Will preorder labs for completion just prior to upcoming Comprehensive Physical Exam (CPE) preventive care annual visit.

## 2024-01-05 NOTE — Progress Notes (Signed)
 ==============================  [facility]: [phone]   -- Medical Office Visit --  Patient: Jay Fox      Age: 73 y.o.       Sex:  male  Date:   01/05/2024 Today's Healthcare Provider: Bernardino KANDICE Cone, MD  ==============================   Chief Complaint: Eye Problem (Pt has been seen at [facility] twice since last visit pt states.)   Discussed the use of AI scribe software for clinical note transcription with the patient, who gave verbal consent to proceed.  History of Present Illness  73 year old male who presents with worsening light sensitivity and migraines.  He experiences persistent and worsening light sensitivity and migraines, particularly in the evenings. Severe migraines are triggered by exposure to sunlight and are only relieved by lying down in a dark room. Despite using Restasis  in the morning and non-preservative drops throughout the day, he finds no relief during severe episodes.  He underwent extensive testing at [facility], including a bright light test and an ERG test. He recalls that the left eye was described as more severely affected than the right. He reports more pain in the left eye. He has a history of blurry vision, pounding heart, and heavy breathing when exposed to bright lights, which led to a cardiology evaluation a year ago. The cardiologist confirmed his heart is healthy.  He has been taking venlafaxine , which has stopped the migraines, but the eye irritation persists. He has not yet tried Excedrin and is awaiting follow-up with the dry eye clinic.  His condition has progressively worsened over the past year, initially triggered by fluorescent lights and television, and now includes sunlight. This has significantly impacted his daily life, requiring his wife to drive him in the afternoons due to increased light sensitivity.  He recently had knee surgery and is undergoing physical therapy. He reports some swelling and a  loss of range of motion following a recent incident, but continues with therapy. He wears FL-41 glasses, although they have not provided relief, he uses them as a precaution during therapy sessions.     Background Reviewed: Problem List: has Asthma; Hypertension; Seasonal allergies; Urinary incontinence; Skin lesions, generalized; Prostate cancer (HCC); Insomnia; Heart palpitations; Benign essential hypertension; Asthma, moderate persistent; History of renal cell carcinoma; Personal history of prostate cancer; History of radical prostatectomy; Leg swelling; Restless leg syndrome; Prediabetes; OSA (obstructive sleep apnea); Back wound; Generalized abdominal pain; High risk medication use; Abdominal distension; Altered olfactory perception; Jugular venous distension; Cervical lymphadenopathy; Hyperlipidemia, acquired; Ocular pain, bilateral; Hyperglycemia; Photoreceptor degeneration; History of right knee joint replacement; Dry eye syndrome of both eyes; Medication management; and Photosensitivity on their problem list. Past Medical History:  has a past medical history of Allergy (1953), Altered olfactory perception (05/31/2022), Anxiety, Arthritis, Asthma, BPH (benign prostatic hypertrophy), BPH (benign prostatic hypertrophy), Cancer (HCC), Cataract, Chronic kidney disease, Depression, GERD (gastroesophageal reflux disease), History of kidney cancer, History of prostate cancer (11/30/2021), Hyperlipidemia (12/08/2022), Hypertension, Leg swelling (11/30/2021), Medication management (01/05/2024), OSA (obstructive sleep apnea) (11/30/2021), Restless legs (11/30/2021), Seasonal allergies, and Sleep apnea. Past Surgical History:   has a past surgical history that includes Tonsillectomy; Renal cryoablation; Nasal sinus surgery (2019); and Cataract extraction, bilateral (Bilateral, 11/2022). Social History:   reports that he has never smoked. He has never used smokeless tobacco. He reports current alcohol use of  about 10.0 standard drinks of alcohol per week. He reports that he does not use drugs. Family History:  family history includes Anxiety disorder  in his mother; Cancer in his father, mother, paternal grandfather, and paternal grandmother; Colon cancer in his paternal grandfather and paternal grandmother; Emphysema in his mother; Hypertension in his father and mother; Liver cancer in his mother; Multiple myeloma in his father; Pancreatic cancer in his mother. Allergies:  is allergic to shellfish allergy and shellfish protein-containing drug products.   Medication Reconciliation: Current Outpatient Medications on File Prior to Visit  Medication Sig   albuterol  (VENTOLIN  HFA) 108 (90 Base) MCG/ACT inhaler Inhale 2 puffs into the lungs every 6 (six) hours as needed for wheezing or shortness of breath.   amLODipine  (NORVASC ) 10 MG tablet Take 1 tablet by mouth once daily   Ascorbic Acid (VITAMIN C PO) Take 1 tablet by mouth once. 500mg    aspirin  EC 81 MG tablet Take 1 tablet (81 mg total) by mouth daily. Swallow whole.   BREYNA  160-4.5 MCG/ACT inhaler Inhale 2 puffs by mouth twice daily   butalbital -acetaminophen-caffeine  (FIORICET) 50-325-40 MG tablet Take 1 tablet by mouth every 6 (six) hours as needed for headache.   calcium  carbonate (TUMS - DOSED IN MG ELEMENTAL CALCIUM ) 500 MG chewable tablet Chew 1 tablet by mouth as needed.   Calcium  Carbonate-Vit D-Min (CALCIUM  1200 PO) Take 1 tablet by mouth daily.   Cholecalciferol (VITAMIN D3) 50 MCG (2000 UT) TABS Take 1 tablet by mouth daily.   citalopram  (CELEXA ) 10 MG tablet Take 10 mg by mouth daily.   cycloSPORINE  (RESTASIS ) 0.05 % ophthalmic emulsion INSTILL 1 DROP INTO EACH EYE TWICE DAILY   cycloSPORINE  (RESTASIS ) 0.05 % ophthalmic emulsion 1 drop 2 (two) times daily.   fluticasone  (FLONASE ) 50 MCG/ACT nasal spray Place 2 sprays into both nostrils daily.   irbesartan  (AVAPRO ) 300 MG tablet Take 1 tablet by mouth once daily   Multiple Vitamin  (MULTIVITAMIN) tablet Take 1 tablet by mouth daily.   Omega-3 Fatty Acids (FISH OIL) 1200 MG CAPS Take 1,200 mg by mouth in the morning and at bedtime.   Polyethyl Glycol-Propyl Glycol (SYSTANE FREE OP) Apply to eye.   pregabalin  (LYRICA ) 200 MG capsule Take 1 capsule (200 mg total) by mouth at bedtime.   Respiratory Therapy Supplies MISC Filters Hoses tubing masks headgear cleaning supplies for dreamwear and  philips respironics bipap machine.   rosuvastatin  (CRESTOR ) 20 MG tablet Take 1 tablet (20 mg total) by mouth daily.   Saline (SIMPLY SALINE) 0.9 % AERS Place 2 each into the nose as directed. Use nightly for sinus hygiene long-term.  Can also be used as many times daily as desired to assist with clearing congested sinuses.   sildenafil (REVATIO) 20 MG tablet 1 tablet Orally Once a day (Patient not taking: Reported on 12/05/2023)   Spacer/Aero-Holding Chambers (AEROCHAMBER MV) inhaler Use as directed with albuterol  inhaler.   TURMERIC PO Take 1,000 mg by mouth in the morning and at bedtime.   venlafaxine  (EFFEXOR ) 100 MG tablet Take 1 tablet (100 mg total) by mouth daily.   vitamin B-12 (CYANOCOBALAMIN) 500 MCG tablet Take 1 tablet by mouth daily.   zolpidem  (AMBIEN ) 10 MG tablet Take 1 tablet (10 mg total) by mouth at bedtime as needed for sleep. Can NOT take with opioid pain medicine, alcohol, or other sedative.  Risk dementia/mornings impaired alertness   No current facility-administered medications on file prior to visit.  There are no discontinued medications.   Physical Exam:    01/05/2024    8:58 AM 01/03/2024    9:43 AM 12/05/2023   11:04 AM  Vitals  with BMI  Height 5' 9 5' 9   Weight 185 lbs 13 oz 180 lbs 185 lbs  BMI 27.43 26.57   Systolic 120  120  Diastolic 62  70  Pulse 72  65  Vital signs reviewed.  Nursing notes reviewed. Weight trend reviewed. Physical Activity: Unknown (12/30/2023)   Exercise Vital Sign    Days of Exercise per Week: Patient declined    Minutes  of Exercise per Session: Not on file  Recent Concern: Physical Activity - Insufficiently Active (10/14/2023)   Exercise Vital Sign    Days of Exercise per Week: 4 days    Minutes of Exercise per Session: 30 min   General Appearance:  No acute distress appreciable.   Well-groomed, healthy-appearing male.  Well proportioned with no abnormal fat distribution.  Good muscle tone. Pulmonary:  Normal work of breathing at rest, no respiratory distress apparent. SpO2: 98 %  Musculoskeletal: All extremities are intact.  Neurological:  Awake, alert, oriented, and engaged.  No obvious focal neurological deficits or cognitive impairments.  Sensorium seems unclouded.   Speech is clear and coherent with logical content. Psychiatric:  Appropriate mood, pleasant and cooperative demeanor, thoughtful and engaged during the exam   Verbalized to patient: Physical Exam NECK: Neck lymph nodes normal.   Results:   Verbalized to patient: Results DIAGNOSTIC ERG: Delayed retinal response to bright flashing lights, mild in right eye, severely reduced amplitude in flicker cone-driven response in left eye (12/30/2023)     01/05/2024   10:02 AM 01/03/2024    9:51 AM 08/04/2023    1:16 PM 01/18/2023   11:16 AM  PHQ 2/9 Scores  PHQ - 2 Score 2 0 1 4  PHQ- 9 Score 9  1 11     Office Visit on 04/28/2023  Component Date Value Ref Range Status   Iron 04/28/2023 104  50 - 180 mcg/dL Final   TIBC 97/86/7974 304  250 - 425 mcg/dL (calc) Final   %SAT 97/86/7974 34  20 - 48 % (calc) Final   Ferritin 04/28/2023 168  24 - 380 ng/mL Final  Office Visit on 02/03/2023  Component Date Value Ref Range Status   S' Lateral 02/23/2023 2.95  cm Final   Area-P 1/2 02/23/2023 3.03  cm2 Final   P 1/2 time 02/23/2023 935  msec Final   Est EF 02/23/2023 60 - 65%   Final   Sed Rate 02/03/2023 9  0 - 20 mm/h Final   CRP 02/03/2023 <3.0  <8.0 mg/L Final  Clinical Support on 12/28/2022  Component Date Value Ref Range Status    Cologuard 06/07/2022 Negative  Negative Final  Office Visit on 12/08/2022  Component Date Value Ref Range Status   WBC 12/08/2022 6.0  4.0 - 10.5 K/uL Final   RBC 12/08/2022 4.89  4.22 - 5.81 Mil/uL Final   Platelets 12/08/2022 240.0  150.0 - 400.0 K/uL Final   Hemoglobin 12/08/2022 14.2  13.0 - 17.0 g/dL Final   HCT 90/74/7975 42.9  39.0 - 52.0 % Final   MCV 12/08/2022 87.8  78.0 - 100.0 fl Final   MCHC 12/08/2022 33.1  30.0 - 36.0 g/dL Final   RDW 90/74/7975 14.1  11.5 - 15.5 % Final   Sodium 12/08/2022 141  135 - 145 mEq/L Final   Potassium 12/08/2022 4.1  3.5 - 5.1 mEq/L Final   Chloride 12/08/2022 105  96 - 112 mEq/L Final   CO2 12/08/2022 27  19 - 32 mEq/L Final   Glucose, Bld  12/08/2022 109 (H)  70 - 99 mg/dL Final   BUN 90/74/7975 17  6 - 23 mg/dL Final   Creatinine, Ser 12/08/2022 1.02  0.40 - 1.50 mg/dL Final   Total Bilirubin 12/08/2022 0.7  0.2 - 1.2 mg/dL Final   Alkaline Phosphatase 12/08/2022 67  39 - 117 U/L Final   AST 12/08/2022 18  0 - 37 U/L Final   ALT 12/08/2022 21  0 - 53 U/L Final   Total Protein 12/08/2022 7.1  6.0 - 8.3 g/dL Final   Albumin 90/74/7975 4.5  3.5 - 5.2 g/dL Final   GFR 90/74/7975 73.99  >60.00 mL/min Final   Calcium  12/08/2022 9.9  8.4 - 10.5 mg/dL Final   Cholesterol 90/74/7975 134  0 - 200 mg/dL Final   Triglycerides 90/74/7975 227.0 (H)  0.0 - 149.0 mg/dL Final   HDL 90/74/7975 50.90  >39.00 mg/dL Final   VLDL 90/74/7975 45.4 (H)  0.0 - 40.0 mg/dL Final   LDL Cholesterol 12/08/2022 38  0 - 99 mg/dL Final   Total CHOL/HDL Ratio 12/08/2022 3   Final   NonHDL 12/08/2022 83.39   Final   TSH 12/08/2022 2.28  0.35 - 5.50 uIU/mL Final   Hgb A1c MFr Bld 12/08/2022 5.9  4.6 - 6.5 % Final  Office Visit on 05/31/2022  Component Date Value Ref Range Status   COLOGUARD 06/07/2022 Negative  Negative Final  Orders Only on 04/07/2022  Component Date Value Ref Range Status   Total Protein, Urine 04/07/2022 7.9  Not Estab. mg/dL Final   Total Protein,  Urine-Ur/day 04/07/2022 122  30 - 150 mg/24 hr Final   Albumin, U 04/07/2022 50.3  % Final   ALPHA 1 URINE 04/07/2022 9.4  % Final   Alpha 2, Urine 04/07/2022 11.9  % Final   % BETA, Urine 04/07/2022 23.1  % Final   GAMMA GLOBULIN URINE 04/07/2022 5.4  % Final   Free Kappa Lt Chains,Ur 04/07/2022 12.36  1.17 - 86.46 mg/L Final   Free Lambda Lt Chains,Ur 04/07/2022 1.76  0.27 - 15.21 mg/L Corrected   Free Kappa/Lambda Ratio 04/07/2022 7.02  1.83 - 14.26 Corrected   Immunofixation Result, Urine 04/07/2022 Comment   Corrected   Total Volume 04/07/2022 1,550   Final   M-SPIKE %, Urine 04/07/2022 Not Observed  Not Observed % Corrected   Note: 04/07/2022 Comment   Corrected  Appointment on 04/05/2022  Component Date Value Ref Range Status   LDH 04/05/2022 131  98 - 192 U/L Final   Beta-2  Microglobulin 04/05/2022 1.5  0.6 - 2.4 mg/L Final   Kappa free light chain 04/05/2022 18.5  3.3 - 19.4 mg/L Final   Lambda free light chains 04/05/2022 14.1  5.7 - 26.3 mg/L Final   Kappa, lambda light chain ratio 04/05/2022 1.31  0.26 - 1.65 Final   IgG (Immunoglobin G), Serum 04/05/2022 787  603 - 1,613 mg/dL Final   IgA 98/77/7975 149  61 - 437 mg/dL Final   IgM (Immunoglobulin M), Srm 04/05/2022 88  20 - 172 mg/dL Final   Total Protein ELP 04/05/2022 6.6  6.0 - 8.5 g/dL Corrected   Albumin SerPl Elph-Mcnc 04/05/2022 3.8  2.9 - 4.4 g/dL Corrected   Alpha 1 98/77/7975 0.2  0.0 - 0.4 g/dL Corrected   Alpha2 Glob SerPl Elph-Mcnc 04/05/2022 0.7  0.4 - 1.0 g/dL Corrected   B-Globulin SerPl Elph-Mcnc 04/05/2022 1.0  0.7 - 1.3 g/dL Corrected   Gamma Glob SerPl Elph-Mcnc 04/05/2022 0.9  0.4 - 1.8  g/dL Corrected   M Protein SerPl Elph-Mcnc 04/05/2022 Not Observed  Not Observed g/dL Corrected   Globulin, Total 04/05/2022 2.8  2.2 - 3.9 g/dL Corrected   Albumin/Glob SerPl 04/05/2022 1.4  0.7 - 1.7 Corrected   IFE 1 04/05/2022 Comment   Corrected   Please Note 04/05/2022 Comment   Corrected   Sodium 04/05/2022  140  135 - 145 mmol/L Final   Potassium 04/05/2022 4.2  3.5 - 5.1 mmol/L Final   Chloride 04/05/2022 106  98 - 111 mmol/L Final   CO2 04/05/2022 28  22 - 32 mmol/L Final   Glucose, Bld 04/05/2022 110 (H)  70 - 99 mg/dL Final   BUN 98/77/7975 19  8 - 23 mg/dL Final   Creatinine 98/77/7975 0.97  0.61 - 1.24 mg/dL Final   Calcium  04/05/2022 10.2  8.9 - 10.3 mg/dL Final   Total Protein 98/77/7975 7.0  6.5 - 8.1 g/dL Final   Albumin 98/77/7975 4.4  3.5 - 5.0 g/dL Final   AST 98/77/7975 15  15 - 41 U/L Final   ALT 04/05/2022 20  0 - 44 U/L Final   Alkaline Phosphatase 04/05/2022 69  38 - 126 U/L Final   Total Bilirubin 04/05/2022 0.5  0.3 - 1.2 mg/dL Final   GFR, Estimated 04/05/2022 >60  >60 mL/min Final   Anion gap 04/05/2022 6  5 - 15 Final   WBC Count 04/05/2022 5.8  4.0 - 10.5 K/uL Final   RBC 04/05/2022 4.96  4.22 - 5.81 MIL/uL Final   Hemoglobin 04/05/2022 14.7  13.0 - 17.0 g/dL Final   HCT 98/77/7975 43.7  39.0 - 52.0 % Final   MCV 04/05/2022 88.1  80.0 - 100.0 fL Final   MCH 04/05/2022 29.6  26.0 - 34.0 pg Final   MCHC 04/05/2022 33.6  30.0 - 36.0 g/dL Final   RDW 98/77/7975 13.1  11.5 - 15.5 % Final   Platelet Count 04/05/2022 220  150 - 400 K/uL Final   nRBC 04/05/2022 0.0  0.0 - 0.2 % Final   Neutrophils Relative % 04/05/2022 62  % Final   Neutro Abs 04/05/2022 3.6  1.7 - 7.7 K/uL Final   Lymphocytes Relative 04/05/2022 20  % Final   Lymphs Abs 04/05/2022 1.1  0.7 - 4.0 K/uL Final   Monocytes Relative 04/05/2022 11  % Final   Monocytes Absolute 04/05/2022 0.6  0.1 - 1.0 K/uL Final   Eosinophils Relative 04/05/2022 6  % Final   Eosinophils Absolute 04/05/2022 0.4  0.0 - 0.5 K/uL Final   Basophils Relative 04/05/2022 1  % Final   Basophils Absolute 04/05/2022 0.1  0.0 - 0.1 K/uL Final   Immature Granulocytes 04/05/2022 0  % Final   Abs Immature Granulocytes 04/05/2022 0.02  0.00 - 0.07 K/uL Final  Office Visit on 03/29/2022  Component Date Value Ref Range Status   Amylase  03/29/2022 50  27 - 131 U/L Final   Lipase 03/29/2022 20.0  11.0 - 59.0 U/L Final   PSA 03/29/2022 0.00 Repeated and verified X2. (L)  0.10 - 4.00 ng/mL Final   Sodium 03/29/2022 143  135 - 145 mEq/L Final   Potassium 03/29/2022 4.7  3.5 - 5.1 mEq/L Final   Chloride 03/29/2022 105  96 - 112 mEq/L Final   CO2 03/29/2022 30  19 - 32 mEq/L Final   Glucose, Bld 03/29/2022 111 (H)  70 - 99 mg/dL Final   BUN 98/84/7975 20  6 - 23 mg/dL Final   Creatinine,  Ser 03/29/2022 0.98  0.40 - 1.50 mg/dL Final   Total Bilirubin 03/29/2022 0.5  0.2 - 1.2 mg/dL Final   Alkaline Phosphatase 03/29/2022 76  39 - 117 U/L Final   AST 03/29/2022 15  0 - 37 U/L Final   ALT 03/29/2022 19  0 - 53 U/L Final   Total Protein 03/29/2022 6.8  6.0 - 8.3 g/dL Final   Albumin 98/84/7975 4.5  3.5 - 5.2 g/dL Final   GFR 98/84/7975 78.01  >60.00 mL/min Final   Calcium  03/29/2022 10.1  8.4 - 10.5 mg/dL Final   WBC 98/84/7975 6.6  4.0 - 10.5 K/uL Final   RBC 03/29/2022 4.89  4.22 - 5.81 Mil/uL Final   Platelets 03/29/2022 226.0  150.0 - 400.0 K/uL Final   Hemoglobin 03/29/2022 14.5  13.0 - 17.0 g/dL Final   HCT 98/84/7975 43.3  39.0 - 52.0 % Final   MCV 03/29/2022 88.6  78.0 - 100.0 fl Final   MCHC 03/29/2022 33.4  30.0 - 36.0 g/dL Final   RDW 98/84/7975 13.6  11.5 - 15.5 % Final  Lab on 03/03/2022  Component Date Value Ref Range Status   Immunofix Electr Int 03/03/2022    Final   Immunoglobulin A 03/03/2022 151  70 - 320 mg/dL Final   IgG (Immunoglobin G), Serum 03/03/2022 836  600 - 1,540 mg/dL Final   IgM, Serum 87/79/7976 92  50 - 300 mg/dL Final  No image results found. No results found.       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 Symptoms include severe photosensitivity, eye pain, and headaches. Recent ERG test showed severe reduced amplitude in flicker cone-driven response in the left eye, indicating significant retinal dysfunction. 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 Continue Restasis  for local immunosuppression. Minimize light exposure to prevent symptom progression. Follow up with the dry eye clinic and [facility]ophthalmologist at [facility] for further evaluation and management. Discuss potential systemic immunosuppression with specialists at [facility]. Avoid using erythromycin  ophthalmic ointment. Dry eye syndrome   Chronic dry eye syndrome persists despite aggressive use of artificial tears. Continue Restasis  twice daily and use non-preservative artificial tears as needed. Ensure follow-up appointment with the dry eye clinic.   Reviewed [employer] reports on his erg testing with the patient:   Most Likely Diagnoses:  1. Operating microscope-induced retinal phototoxicity: The combination of severe bilateral eye pain, photophobia, and panphotoreceptor dysfunction on electroretinogram (ERG) after cataract surgery is highly suggestive of retinal phototoxicity from surgical microscope light exposure. This is especially likely if the surgery was prolonged or involved combined procedures. Phototoxic maculopathy can present with decreased vision, pain, and RPE changes, and is more common after complex or lengthy intraocular surgeries. ERG findings of panphotoreceptor dysfunction support diffuse retinal injury rather than isolated macular edema or anterior segment pathology.[1]  2. Drug or preservative-induced retinal toxicity: Intracameral antibiotics (e.g., moxifloxacin, cefuroxime, vancomycin) used during cataract surgery have been associated with retinal toxicity, especially if dosing errors occur. These agents can cause direct retinal vascular and neuronal injury,  leading to panretinal dysfunction and severe visual symptoms. Bilateral involvement would be unusual but possible if the same agent or preparation was used in both eyes.[2]  3. Toxic anterior segment syndrome (TASS): TASS typically  presents with acute, severe anterior segment inflammation within 12-48 hours postoperatively, but it is usually limited to the anterior segment and does not cause panphotoreceptor dysfunction on ERG. However, severe cases or associated toxic agents could theoretically extend posteriorly.[3]  4. Cystoid macular edema (Irvine-Gass syndrome): CME is a common cause of visual loss and photophobia after cataract surgery, but it rarely causes severe pain or panphotoreceptor dysfunction on ERG. 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 It can cause severe pain, photophobia, and diffuse retinal dysfunction. Early recognition is critical, as prompt systemic immunosuppression can preserve vision. OCT and clinical exam for granulomatous uveitis are key for diagnosis.[5]  2. Acute postoperative infectious endophthalmitis: Presents with severe pain, decreased vision, and intraocular inflammation, but typically shows hypopyon and vitritis rather than isolated panphotoreceptor dysfunction. Rapid progression and purulent discharge are classic.  3. Acute angle-closure glaucoma: Can cause severe pain and photophobia postoperatively, but is usually associated with elevated intraocular pressure and corneal edema, not panphotoreceptor dysfunction.  Key Additional History and Follow-up Tests:  - Detailed surgical history: Duration of surgery, use of microscope, intraoperative complications, and specific drugs/antibiotics used.  - Slit-lamp and fundus examination: To assess for anterior segment  inflammation, retinal changes, or signs of infection.  - OCT and fluorescein angiography: To evaluate for CME, retinal pigment epithelium changes, or choroidal involvement.  - Review of intraoperative medications and instrument sterilization protocols: To assess for possible toxic exposures.  - Systemic review for malignancy or autoimmune symptoms: If paraneoplastic or autoimmune retinopathy is suspected.  In summary, retinal phototoxicity and drug-induced retinal toxicity are the leading considerations given the bilateral, severe, and panretinal nature of dysfunction after cataract surgery, but sympathetic ophthalmia and infection must be actively ruled out.[1][2][5]   Would you like me to review the detailed intraoperative records--including microscope light settings, duration of exposure, and all medications or solutions used during surgery--to help pinpoint any potential sources of retinal toxicity or photic injury? References Operating Microscope Light-Induced Phototoxic Maculopathy After Transscleral Sutured Posterior Chamber Intraocular Lens Implantation. Kweon EY, Ahn M, Lee DW, et al. [facility] [place] 2009 Nov-Dec;29(10):1491-5. doi:10.1097/[id number]. Toxicities of and Inflammatory Responses to Moxifloxacin, Cefuroxime, and Vancomycin on Retinal Vascular Cells. Areta VEAR Edwena JONETTA Arch CINDERELLA, et al. Scientific Reports. 2019;9(1):9745. doi:10.1038/[id number]. Toxic Anterior Segment Syndrome (TASS): A Review and Update. Lonnie LITTIE Gershon DELENA, Maharana PK, Donnice ONEIDA Frutoso LOISE. [place] Journal of Ophthalmology. 2024;72(1):11-18. doi:10.4103/[email or web address]. Current Management Options in Irvine-Gass Syndrome: A Systemized Review. Yolonda CHRISTELLA Cleda [place] Journal of Clinical Medicine. 2021;10(19):4375. doi:10.3390/[id number]. Recent Advances in Diagnosis and Management of Sympathetic Ophthalmia. Fromal OV, Swaminathan V, Soares [place], Ho AC. Current Opinion in Ophthalmology.  2021;32(6):555-560. doi:10.1097/ICU.[email or web address].  Benign essential hypertension Personal history of prostate cancer Hyperglycemia Hyperlipidemia, acquired Medication management Routine health maintenance discussed, including the need for annual blood work and prostate cancer screening. Schedule annual physical with fasting blood work next month. Coordinate PSA test with [facility]. Consider CT scan for kidney monitoring due to renal cell carcinoma.  Will preorder labs for completion just prior to upcoming Comprehensive Physical Exam (CPE) preventive care annual visit. History of right knee joint replacement Status post left knee surgery with ongoing swelling and limited range of motion   Post-operative status following left knee surgery with ongoing swelling and limited range of motion. Reports a recent decrease in range of  motion following a popping sensation. Continue physical therapy for knee rehabilitation. Monitor for changes in swelling and range of motion.   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 Photosensitivity  L56.8     3. Benign essential hypertension  I10 Comprehensive metabolic panel with GFR    CBC with Differential/Platelet    CBC with Differential/Platelet    Comprehensive metabolic panel with GFR    4. Personal history of prostate cancer  Z85.46     5. Hyperglycemia  R73.9 Hemoglobin A1c    Hemoglobin A1c    6. Hyperlipidemia, acquired  E78.5 Lipid panel    Lipid panel    7. Medication management  Z79.899 TSH Rfx on Abnormal to Free T4    TSH Rfx on Abnormal to Free T4    8. Dry eye syndrome of both eyes  H04.123     9. History of right knee joint replacement  Z96.651            Orders Placed in Encounter:   Lab Orders         Lipid panel         Comprehensive metabolic panel with GFR         CBC with Differential/Platelet         TSH Rfx on  Abnormal to Free T4         Hemoglobin A1c         Hemoglobin A1c         TSH Rfx on Abnormal to Free T4         CBC with Differential/Platelet         Comprehensive metabolic panel with GFR         Lipid panel       This document was synthesized by artificial intelligence (Abridge) using HIPAA-compliant recording of the clinical interaction;   We discussed the use of AI scribe software for clinical note transcription with the patient, who gave verbal consent to proceed. additional Info: This encounter employed state-of-the-art, real-time, collaborative documentation. The patient actively reviewed and assisted in updating their electronic medical record on a shared screen, ensuring transparency and facilitating joint problem-solving for the problem list, overview, and plan. This approach promotes accurate, informed care. The treatment plan was discussed and reviewed in detail, including medication safety, potential side effects, and all patient questions. We confirmed understanding and comfort with the plan. Follow-up instructions were established, including contacting the office for any concerns, returning if symptoms worsen, persist, or new symptoms develop, and precautions for potential emergency department visits.  This patient couldn't really tolerate to look at the computer screen-limiting value, but we copied report to AVS instructions for print  On the day of the visit, I dedicated 32 minutes to both direct and indirect patient care activities.  The time was spent: Preparation: I reviewed the patient's records before and during the visit to support individualized clinical decision making. History: I obtained, documented, and reviewed a thorough medical history. I reviewed the patient's reported symptoms and clarified their context and significance in relation to the current visit. Data Synthesis: I synthesized information for clinical decision-making. Communication: I communicated clinical  status and plan to the patient and/or family/caregiver. Counseling & Education: I provided personalized counseling on condition and treatment. Orders: I placed necessary orders (medications, labs, imaging, referrals) in the EMR..  This time was spent  independently of any separately billable procedures. Please note that this statement is intended to provide a clear and comprehensive account of the time and services provided during the patient's visit.  The extended time spent was necessary to provide safe, effective, and comprehensive care due to the following factors:, Patient Requested Extended Education & Communication: At the patient's request, I spent additional time educating the patient and/or family members (spouse) to ensure understanding of complex health information, answer questions, and support shared decision-making-time that exceeded a typical visit duration and was not separately billable., Patient Requested In-Depth Education on Disease Management: At the patient's request, I provided additional time to thoroughly educate them on self management of their chronic condition, review lifestyle modifications, and clarify follow up plans-time that exceeded typical encounter duration and was not separately billable., and Family Inclusion for Care Understanding: The patient asked that spouse be included in the visit for teaching and care discussion. I spent extra time to ensure both understood the care plan, risks, and medication instructions beyond what is routine.

## 2024-01-05 NOTE — Assessment & Plan Note (Signed)
 Status post left knee surgery with ongoing swelling and limited range of motion   Post-operative status following left knee surgery with ongoing swelling and limited range of motion. Reports a recent decrease in range of motion following a popping sensation. Continue physical therapy for knee rehabilitation. Monitor for changes in swelling and range of motion.

## 2024-01-05 NOTE — Patient Instructions (Signed)
 It was a pleasure seeing you today! Your health and satisfaction are our top priorities.  Jay Cone, MD  VISIT SUMMARY: During your visit, we discussed your worsening light sensitivity and migraines, your ongoing dry eye syndrome, and your post-operative knee condition. We also reviewed your general health maintenance needs.  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 It may have been triggered by cataract surgery. Continue using Restasis , minimize light exposure, and follow up with the dry eye clinic and neuro-ophthalmologist at [facility]. We will discuss potential systemic immunosuppression with specialists at [facility]. Avoid using erythromycin  ophthalmic ointment.  -DRY EYE SYNDROME: Dry eye syndrome is a condition where your eyes do not produce enough tears or the right quality of tears. Continue using Restasis  twice daily and non-preservative artificial tears as needed. Ensure you follow up with the dry eye clinic.  -STATUS POST LEFT KNEE SURGERY WITH ONGOING SWELLING AND LIMITED RANGE OF MOTION: Following your knee surgery, you are experiencing swelling and limited range of motion. Continue with physical therapy and monitor for any changes in swelling and range of motion.  -GENERAL HEALTH MAINTENANCE: We discussed the importance of routine health maintenance, including annual blood work and prostate cancer screening. Schedule your annual physical with fasting blood work next month and coordinate your PSA test with [facility]. Consider a CT scan for kidney monitoring due to your history of renal cell carcinoma.  INSTRUCTIONS: Please follow up with the dry eye clinic and neuro-ophthalmologist at [facility] for further evaluation and management of your retinal photoreceptor dysfunction. Continue with physical therapy for your knee  and monitor for any changes. Schedule your annual physical with fasting blood work next month and coordinate your PSA test with [facility]. Consider a CT scan for kidney monitoring due to your history of renal cell carcinoma.  Your Providers PCP: Jay Fox MATSU, MD,  [phone]) Referring Provider: Cone Jay MATSU, MD,  [phone]) Care Team Provider: Brien Belvie BRAVO, MD,  [phone]) Care Team Provider: Leigh Venetia CROME, MD,  [phone])  NEXT STEPS: [x]  Early Intervention: Schedule sooner appointment, call our on-call services, or go to emergency room if there is any significant Increase in pain or discomfort New or worsening symptoms Sudden or severe changes in your health [x]  Flexible Follow-Up: We recommend a Return in about 1 month (around 02/05/2024) for annual preventive care visit. for optimal routine care. This allows for progress monitoring and treatment adjustments. [x]  Preventive Care: Schedule your annual preventive care visit! It's typically covered by insurance and helps identify potential health issues early. [x]  Lab & X-ray Appointments: Incomplete tests scheduled today, or call to schedule. X-rays: [facility] (M-F, 8:30am-noon or 1pm-5pm). [x]  Medical Information Release: Sign a release form at front desk to obtain relevant medical information we don't have.  MAKING THE MOST OF OUR FOCUSED 20 MINUTE APPOINTMENTS: [x]   Clearly state your top concerns at the beginning of the visit to focus our discussion [x]   If you anticipate you will need more time, please inform the front desk during scheduling - we can book multiple appointments in the same week. [x]   If you have transportation problems- use our convenient video appointments or ask about transportation support. [x]   We can get down to business faster if you use MyChart to update information before the visit and submit non-urgent questions before your visit. Thank  you for taking the  time to provide details through MyChart.  Let our nurse know and she can import this information into your encounter documents.  Arrival and Wait Times: [x]   Arriving on time ensures that everyone receives prompt attention. [x]   Early morning (8a) and afternoon (1p) appointments tend to have shortest wait times. [x]   Unfortunately, we cannot delay appointments for late arrivals or hold slots during phone calls.  Getting Answers and Following Up [x]   Simple Questions & Concerns: For quick questions or basic follow-up after your visit, reach us  at [phone] or MyChart messaging. [x]   Complex Concerns: If your concern is more complex, scheduling an appointment might be best. Discuss this with the staff to find the most suitable option. [x]   Lab & Imaging Results: We'll contact you directly if results are abnormal or you don't use MyChart. Most normal results will be on MyChart within 2-3 business days, with a review message from Dr. Jesus. Haven't heard back in 2 weeks? Need results sooner? Contact us  at [phone]. [x]   Referrals: Our referral coordinator will manage specialist referrals. The specialist's office should contact you within 2 weeks to schedule an appointment. Call us  if you haven't heard from them after 2 weeks.  Staying Connected [x]   MyChart: Activate your MyChart for the fastest way to access results and message us . See the last page of this paperwork for instructions on how to activate.  Bring to Your Next Appointment [x]   Medications: Please bring all your medication bottles to your next appointment to ensure we have an accurate record of your prescriptions. [x]   Health Diaries: If you're monitoring any health conditions at home, keeping a diary of your readings can be very helpful for discussions at your next appointment.  Billing [x]   X-ray & Lab Orders: These are billed by separate companies. Contact the invoicing company directly for questions or concerns. [x]    Visit Charges: Discuss any billing inquiries with our administrative services team.  Your Satisfaction Matters [x]   Share Your Experience: We strive for your satisfaction! If you have any complaints, or preferably compliments, please let Dr. Jesus know directly or contact our Practice Administrators, Manuelita Rubin or [employer], by asking at the front desk.   Reviewing Your Records [x]   Review this early draft of your clinical encounter notes below and the final encounter summary tomorrow on MyChart after its been completed.  All orders placed so far are visible here: Photoreceptor degeneration  Photosensitivity  Benign essential hypertension -     Comprehensive metabolic panel with GFR -     CBC with Differential/Platelet -     CBC with Differential/Platelet; Future -     Comprehensive metabolic panel with GFR; Future  Personal history of prostate cancer  Hyperglycemia -     Hemoglobin A1c -     Hemoglobin A1c; Future  Hyperlipidemia, acquired -     Lipid panel -     Lipid panel; Future  Medication management -     TSH Rfx on Abnormal to Free T4 -     TSH Rfx on Abnormal to Free T4; Future  Dry eye syndrome of both eyes  History of right knee joint replacement     OpenEvidence  Report for your case:  Most Likely Diagnoses:  1. Operating microscope-induced retinal phototoxicity: The combination of severe bilateral eye pain, photophobia, and panphotoreceptor dysfunction on electroretinogram (ERG) after cataract surgery is highly suggestive of retinal phototoxicity from surgical microscope light exposure. This  is especially likely if the surgery was prolonged or involved combined procedures. Phototoxic maculopathy can present with decreased vision, pain, and RPE changes, and is more common after complex or lengthy intraocular surgeries. ERG findings of panphotoreceptor dysfunction support diffuse retinal injury rather than isolated macular edema or anterior segment  pathology.[1]  2. Drug or preservative-induced retinal toxicity: Intracameral antibiotics (e.g., moxifloxacin, cefuroxime, vancomycin) used during cataract surgery have been associated with retinal toxicity, especially if dosing errors occur. These agents can cause direct retinal vascular and neuronal injury, leading to panretinal dysfunction and severe visual symptoms. Bilateral involvement would be unusual but possible if the same agent or preparation was used in both eyes.[2]  3. Toxic anterior segment syndrome (TASS): TASS typically presents with acute, severe anterior segment inflammation within 12-48 hours postoperatively, but it is usually limited to the anterior segment and does not cause panphotoreceptor dysfunction on ERG. However, severe cases or associated toxic agents could theoretically extend posteriorly.[3]  4. Cystoid macular edema (Irvine-Gass syndrome): CME is a common cause of visual loss and photophobia after cataract surgery, but it rarely causes severe pain or panphotoreceptor dysfunction on ERG. 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 It can cause severe pain, photophobia, and diffuse retinal dysfunction. Early recognition is critical, as prompt systemic immunosuppression can preserve vision. OCT and clinical exam for granulomatous uveitis are key for diagnosis.[5]  2. Acute postoperative infectious endophthalmitis: Presents with severe pain, decreased vision, and intraocular inflammation, but typically shows hypopyon and vitritis rather than isolated panphotoreceptor dysfunction. Rapid progression and purulent discharge are classic.  3. Acute angle-closure glaucoma: Can cause severe pain and photophobia postoperatively, but is usually associated with elevated intraocular  pressure and corneal edema, not panphotoreceptor dysfunction.  Key Additional History and Follow-up Tests:  - Detailed surgical history: Duration of surgery, use of microscope, intraoperative complications, and specific drugs/antibiotics used.  - Slit-lamp and fundus examination: To assess for anterior segment inflammation, retinal changes, or signs of infection.  - OCT and fluorescein angiography: To evaluate for CME, retinal pigment epithelium changes, or choroidal involvement.  - Review of intraoperative medications and instrument sterilization protocols: To assess for possible toxic exposures.  - Systemic review for malignancy or autoimmune symptoms: If paraneoplastic or autoimmune retinopathy is suspected.  In summary, retinal phototoxicity and drug-induced retinal toxicity are the leading considerations given the bilateral, severe, and panretinal nature of dysfunction after cataract surgery, but sympathetic ophthalmia and infection must be actively ruled out.[1][2][5]   Would you like me to review the detailed intraoperative records--including microscope light settings, duration of exposure, and all medications or solutions used during surgery--to help pinpoint any potential sources of retinal toxicity or photic injury? References Operating Microscope Light-Induced Phototoxic Maculopathy After Transscleral Sutured Posterior Chamber Intraocular Lens Implantation. Kweon EY, Ahn M, Lee DW, et al. [facility] [place] 2009 Nov-Dec;29(10):1491-5. doi:10.1097/IAE.[id number]. Toxicities of and Inflammatory Responses to Moxifloxacin, Cefuroxime, and Vancomycin on Retinal Vascular Cells. Areta VEAR Edwena JONETTA Arch CINDERELLA, et al. Scientific Reports. 2019;9(1):9745. doi:10.1038/s41598-019-46236-2. Toxic Anterior Segment Syndrome (TASS): A Review and Update. Lonnie LITTIE Gershon DELENA, Maharana PK, Donnice ONEIDA Frutoso LOISE. [place] Journal of Ophthalmology. 2024;72(1):11-18.  doi:10.4103/IJO.[email or web address]_[email or web address]_76. Current Management Options in Irvine-Gass Syndrome: A Systemized Review. Yolonda CHRISTELLA Cleda [place] Journal of Clinical Medicine. 2021;10(19):4375. doi:10.3390/[id number]. Recent Advances in Diagnosis and Management of Sympathetic Ophthalmia. Fromal OV, Swaminathan V,  Soares RR, Ho AC. Current Opinion in Ophthalmology. 2021;32(6):555-560. doi:10.1097/ICU.0000000000000803.

## 2024-01-11 ENCOUNTER — Other Ambulatory Visit: Payer: Self-pay | Admitting: Neurology

## 2024-01-11 ENCOUNTER — Other Ambulatory Visit: Payer: Self-pay | Admitting: Internal Medicine

## 2024-01-11 DIAGNOSIS — G2581 Restless legs syndrome: Secondary | ICD-10-CM

## 2024-01-11 DIAGNOSIS — F32A Depression, unspecified: Secondary | ICD-10-CM

## 2024-01-11 DIAGNOSIS — R209 Unspecified disturbances of skin sensation: Secondary | ICD-10-CM

## 2024-01-11 DIAGNOSIS — M1711 Unilateral primary osteoarthritis, right knee: Secondary | ICD-10-CM | POA: Diagnosis not present

## 2024-01-11 NOTE — Telephone Encounter (Signed)
 review

## 2024-01-16 DIAGNOSIS — M25561 Pain in right knee: Secondary | ICD-10-CM | POA: Diagnosis not present

## 2024-01-17 ENCOUNTER — Other Ambulatory Visit: Payer: Self-pay

## 2024-01-17 ENCOUNTER — Encounter: Payer: Self-pay | Admitting: Neurology

## 2024-01-17 DIAGNOSIS — R209 Unspecified disturbances of skin sensation: Secondary | ICD-10-CM

## 2024-01-17 DIAGNOSIS — M9903 Segmental and somatic dysfunction of lumbar region: Secondary | ICD-10-CM | POA: Diagnosis not present

## 2024-01-17 DIAGNOSIS — G2581 Restless legs syndrome: Secondary | ICD-10-CM

## 2024-01-17 MED ORDER — PREGABALIN 200 MG PO CAPS: 200.0000 mg | ORAL_CAPSULE | Freq: Every day | ORAL | 0 refills | Status: AC

## 2024-01-18 DIAGNOSIS — M1711 Unilateral primary osteoarthritis, right knee: Secondary | ICD-10-CM | POA: Diagnosis not present

## 2024-01-26 DIAGNOSIS — M1711 Unilateral primary osteoarthritis, right knee: Secondary | ICD-10-CM | POA: Diagnosis not present

## 2024-01-27 ENCOUNTER — Encounter: Payer: Self-pay | Admitting: Internal Medicine

## 2024-01-27 DIAGNOSIS — G4700 Insomnia, unspecified: Secondary | ICD-10-CM

## 2024-01-27 DIAGNOSIS — L568 Other specified acute skin changes due to ultraviolet radiation: Secondary | ICD-10-CM

## 2024-01-30 ENCOUNTER — Other Ambulatory Visit: Payer: Self-pay | Admitting: Internal Medicine

## 2024-01-30 DIAGNOSIS — L568 Other specified acute skin changes due to ultraviolet radiation: Secondary | ICD-10-CM

## 2024-01-30 DIAGNOSIS — G4700 Insomnia, unspecified: Secondary | ICD-10-CM

## 2024-01-30 NOTE — Telephone Encounter (Signed)
 Copied from CRM #[id number]. Topic: Clinical - Medication Question >> Jan 30, 2024 10:25 AM Olam RAMAN wrote: Reason for CRM: pt is calling for  Zolpiden  10Mg   stated pharmacy advised him it was needing authorization

## 2024-01-31 DIAGNOSIS — M9903 Segmental and somatic dysfunction of lumbar region: Secondary | ICD-10-CM | POA: Diagnosis not present

## 2024-01-31 MED ORDER — ZOLPIDEM TARTRATE 10 MG PO TABS: 10.0000 mg | ORAL_TABLET | Freq: Every evening | ORAL | 0 refills | Status: AC | PRN

## 2024-02-06 DIAGNOSIS — M9903 Segmental and somatic dysfunction of lumbar region: Secondary | ICD-10-CM | POA: Diagnosis not present

## 2024-02-06 DIAGNOSIS — M51361 Other intervertebral disc degeneration, lumbar region with lower extremity pain only: Secondary | ICD-10-CM | POA: Diagnosis not present

## 2024-02-07 ENCOUNTER — Ambulatory Visit: Payer: Self-pay | Admitting: Internal Medicine

## 2024-02-07 ENCOUNTER — Other Ambulatory Visit (INDEPENDENT_AMBULATORY_CARE_PROVIDER_SITE_OTHER)

## 2024-02-07 DIAGNOSIS — E785 Hyperlipidemia, unspecified: Secondary | ICD-10-CM | POA: Diagnosis not present

## 2024-02-07 DIAGNOSIS — Z79899 Other long term (current) drug therapy: Secondary | ICD-10-CM | POA: Diagnosis not present

## 2024-02-07 DIAGNOSIS — R739 Hyperglycemia, unspecified: Secondary | ICD-10-CM

## 2024-02-07 DIAGNOSIS — I10 Essential (primary) hypertension: Secondary | ICD-10-CM | POA: Diagnosis not present

## 2024-02-07 LAB — CBC WITH DIFFERENTIAL/PLATELET
Basophils Absolute: 0.1 K/uL (ref 0.0–0.1)
Basophils Relative: 1.8 % (ref 0.0–3.0)
Eosinophils Absolute: 0.3 K/uL (ref 0.0–0.7)
Eosinophils Relative: 6.9 % — ABNORMAL HIGH (ref 0.0–5.0)
HCT: 39.9 % (ref 39.0–52.0)
Hemoglobin: 13.3 g/dL (ref 13.0–17.0)
Lymphocytes Relative: 26.1 % (ref 12.0–46.0)
Lymphs Abs: 1.3 K/uL (ref 0.7–4.0)
MCHC: 33.3 g/dL (ref 30.0–36.0)
MCV: 86 fl (ref 78.0–100.0)
Monocytes Absolute: 0.5 K/uL (ref 0.1–1.0)
Monocytes Relative: 9.8 % (ref 3.0–12.0)
Neutro Abs: 2.8 K/uL (ref 1.4–7.7)
Neutrophils Relative %: 55.4 % (ref 43.0–77.0)
Platelets: 190 K/uL (ref 150.0–400.0)
RBC: 4.64 Mil/uL (ref 4.22–5.81)
RDW: 13.7 % (ref 11.5–15.5)
WBC: 5 K/uL (ref 4.0–10.5)

## 2024-02-07 LAB — COMPREHENSIVE METABOLIC PANEL WITH GFR
ALT: 17 U/L (ref 0–53)
AST: 14 U/L (ref 0–37)
Albumin: 4.3 g/dL (ref 3.5–5.2)
Alkaline Phosphatase: 69 U/L (ref 39–117)
BUN: 20 mg/dL (ref 6–23)
CO2: 29 meq/L (ref 19–32)
Calcium: 9.3 mg/dL (ref 8.4–10.5)
Chloride: 107 meq/L (ref 96–112)
Creatinine, Ser: 1.06 mg/dL (ref 0.40–1.50)
GFR: 70.08 mL/min (ref >60.00)
Glucose, Bld: 101 mg/dL — ABNORMAL HIGH (ref 70–99)
Potassium: 4.4 meq/L (ref 3.5–5.1)
Sodium: 141 meq/L (ref 135–145)
Total Bilirubin: 0.5 mg/dL (ref 0.2–1.2)
Total Protein: 6.5 g/dL (ref 6.0–8.3)

## 2024-02-07 LAB — LIPID PANEL
Cholesterol: 92 mg/dL (ref 0–200)
HDL: 52.8 mg/dL (ref >39.00)
LDL Cholesterol: 19 mg/dL (ref 0–99)
NonHDL: 39.56
Total CHOL/HDL Ratio: 2
Triglycerides: 102 mg/dL (ref 0.0–149.0)
VLDL: 20.4 mg/dL (ref 0.0–40.0)

## 2024-02-07 LAB — HEMOGLOBIN A1C: Hgb A1c MFr Bld: 5.9 % (ref 4.6–6.5)

## 2024-02-08 DIAGNOSIS — H04123 Dry eye syndrome of bilateral lacrimal glands: Secondary | ICD-10-CM | POA: Diagnosis not present

## 2024-02-08 DIAGNOSIS — H5713 Ocular pain, bilateral: Secondary | ICD-10-CM | POA: Diagnosis not present

## 2024-02-08 DIAGNOSIS — H5703 Miosis: Secondary | ICD-10-CM | POA: Diagnosis not present

## 2024-02-08 LAB — TSH RFX ON ABNORMAL TO FREE T4: TSH: 2.93 u[IU]/mL (ref 0.450–4.500)

## 2024-02-14 ENCOUNTER — Encounter: Payer: Self-pay | Admitting: Internal Medicine

## 2024-02-14 ENCOUNTER — Ambulatory Visit: Admitting: Internal Medicine

## 2024-02-14 VITALS — BP 120/62 | HR 70 | Temp 98.0°F | Ht 69.0 in | Wt 190.2 lb

## 2024-02-14 DIAGNOSIS — J4540 Moderate persistent asthma, uncomplicated: Secondary | ICD-10-CM

## 2024-02-14 DIAGNOSIS — Z13220 Encounter for screening for lipoid disorders: Secondary | ICD-10-CM

## 2024-02-14 DIAGNOSIS — Z0001 Encounter for general adult medical examination with abnormal findings: Secondary | ICD-10-CM

## 2024-02-14 DIAGNOSIS — G2581 Restless legs syndrome: Secondary | ICD-10-CM

## 2024-02-14 DIAGNOSIS — Z136 Encounter for screening for cardiovascular disorders: Secondary | ICD-10-CM

## 2024-02-14 DIAGNOSIS — Z1211 Encounter for screening for malignant neoplasm of colon: Secondary | ICD-10-CM

## 2024-02-14 DIAGNOSIS — E785 Hyperlipidemia, unspecified: Secondary | ICD-10-CM

## 2024-02-14 DIAGNOSIS — Z119 Encounter for screening for infectious and parasitic diseases, unspecified: Secondary | ICD-10-CM

## 2024-02-14 DIAGNOSIS — Z780 Asymptomatic menopausal state: Secondary | ICD-10-CM

## 2024-02-14 DIAGNOSIS — Z96651 Presence of right artificial knee joint: Secondary | ICD-10-CM

## 2024-02-14 DIAGNOSIS — R590 Localized enlarged lymph nodes: Secondary | ICD-10-CM

## 2024-02-14 DIAGNOSIS — D721 Eosinophilia, unspecified: Secondary | ICD-10-CM

## 2024-02-14 DIAGNOSIS — Z125 Encounter for screening for malignant neoplasm of prostate: Secondary | ICD-10-CM

## 2024-02-14 DIAGNOSIS — K429 Umbilical hernia without obstruction or gangrene: Secondary | ICD-10-CM

## 2024-02-14 DIAGNOSIS — H9192 Unspecified hearing loss, left ear: Secondary | ICD-10-CM

## 2024-02-14 DIAGNOSIS — R002 Palpitations: Secondary | ICD-10-CM

## 2024-02-14 DIAGNOSIS — Z5320 Procedure and treatment not carried out because of patient's decision for unspecified reasons: Secondary | ICD-10-CM

## 2024-02-14 DIAGNOSIS — C61 Malignant neoplasm of prostate: Secondary | ICD-10-CM

## 2024-02-14 DIAGNOSIS — Z131 Encounter for screening for diabetes mellitus: Secondary | ICD-10-CM

## 2024-02-14 DIAGNOSIS — Z85528 Personal history of other malignant neoplasm of kidney: Secondary | ICD-10-CM

## 2024-02-14 DIAGNOSIS — M7989 Other specified soft tissue disorders: Secondary | ICD-10-CM

## 2024-02-14 MED ORDER — CITALOPRAM HYDROBROMIDE 10 MG PO TABS: 10.0000 mg | ORAL_TABLET | Freq: Every day | ORAL | 4 refills | Status: AC

## 2024-02-14 NOTE — Progress Notes (Unsigned)
 [facility] [address] [place], [place] [address] Office:  [phone]  -- Annual Preventive Medical Office Visit --  Patient:  Jay Fox      Age: 73 y.o.       Sex:  male  Date:   02/14/2024 Patient Care Team: Jesus [name], MD as PCP - General (Internal Medicine) Brien Belvie BRAVO, MD as Attending Physician (Pulmonary Disease) Leigh Venetia CROME, MD as Consulting Physician (Neurology) Today's Healthcare Provider: [name], MD  ========================================= Chief complaint: Annual Exam  Purpose of Visit: Comprehensive preventive health assessment and personalized health maintenance planning.  This encounter was conducted as a Comprehensive Physical Exam (CPE) preventive care annual visit. The patient's medical history and problem list were reviewed to inform individualized preventive care recommendations.   No problem-specific medical treatment was provided during this visit.  Assessment & Plan Encounter for annual general medical examination with abnormal findings in adult Screening for cardiovascular condition Screening declined by patient Screening for malignant neoplasm of colon Screening for diabetes mellitus Screening for lipoid disorders Screening examination for infectious disease Screening for malignant neoplasm of prostate Comprehensive Preventive Examination (Annual Wellness Visit) Performed Today.  Abridge Summary:  Adult Wellness Visit   An annual physical examination was conducted, and last week's blood work was normal, including a PSA of 0.0. LDL cholesterol is exceptionally low at 19. There are no signs of cancer or cardiovascular issues. Vaccinations are current, including pneumonia, flu, and COVID. An RSV vaccine is recommended due to asthma.  ?? Core Review: Interval HPI/ROS reviewed. History, current Medications (including reconciliation), known Allergies, and Immunization Status were thoroughly  reviewed and updated. ?? Risk Assessment: Comprehensive Family, Social (including tobacco/alcohol/substance use), and Safety/Functional risks were assessed. Vitals and a complete, age-appropriate Physical Exam were fully documented. ? Preventive Planning: I reviewed all appropriate preventive screening tests (e.g., mammogram, colonoscopy, cervical cancer screening) and immunization needs (e.g., influenza, pneumococcal, Zoster). ??? Counseling & Education: Provided individualized counseling on Healthy Diet (e.g., DASH/Mediterranean), Regular Physical Activity (consistent with CDC guidelines), Sleep Hygiene, Stress Management strategies, and Mental Health screening results. ?? Conclusion: Shared decision-making was performed for all recommended interventions. Orders were placed for indicated screenings and/or vaccinations. Written educational materials were provided to reinforce today's counseling and plan. Screening for malignant neoplasm of colon and prostate   There is a family history of colon cancer. He is awaiting a urology appointment for prostate cancer screening. Previous genetic testing indicated a likelihood of recurrence or new cancer, but no current need for further genetic testing.  Screening for cardiovascular disorders and lipoid disorders   Cholesterol levels are excellent with LDL at 19. There are no signs of cardiovascular issues. Despite a family history of heart attack in his brother, he has no personal history of cardiovascular events. Continue current management and monitoring of cholesterol levels. Cervical lymphadenopathy Carefully examined, this has resolved and was removed from problem list. History of renal cell carcinoma Prostate cancer (HCC) Continue(s) with close surveillance. Personal history of malignant neoplasm of kidney and prostate   Renal cell carcinoma and prostate cancer are in remission. He is awaiting a urology appointment for further evaluation and management.  Previous genetic testing indicated a likelihood of recurrence or new cancer, but there is no current need for further genetic testing. Restless leg syndrome  Leg swelling  Hyperlipidemia, acquired  Heart palpitations  Moderate persistent asthma, unspecified whether complicated Asthma   Symptoms worsen in winter, especially at night, leading to increased  use of rescue inhalers. Vaccinations are up to date, including pneumonia and COVID. An RSV vaccine is recommended due to asthma. 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 Swelling in the right lower leg may be due to a blood clot. There is no tenderness or signs of infection, and balance is better on the right side. Continue follow-up with the orthopedic surgeon for evaluation of swelling and balance issues. Umbilical hernia without obstruction and without gangrene An umbilical hernia was noted on a CT scan from 2024. There are no signs of complications or need for intervention at this time. Continue to monitor for any changes or symptoms. 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 No acute changes are noted. Continue current management. Eosinophilia, unspecified type Allergic rhinitis   Eosinophils are elevated due to allergies. No acute changes are noted. Continue current management.     ICD-10-CM   1. Encounter for annual general medical examination with abnormal findings in adult  Z00.01 citalopram  (CELEXA ) 10 MG tablet    2. Screening for cardiovascular condition  Z13.6     3. Screening declined by patient  Z53.20     4. Screening for malignant neoplasm of colon  Z12.11     5. Screening for diabetes mellitus  Z13.1     6. Screening for lipoid  disorders  Z13.220     7. Screening examination for infectious disease  Z11.9     8. Screening for malignant neoplasm of prostate  Z12.5     9. Cervical lymphadenopathy  R59.0     10. History of renal cell carcinoma  Z85.528     11. Prostate cancer (HCC)  C61     12. Restless leg syndrome  G25.81     13. Leg swelling  M79.89     14. Hyperlipidemia, acquired  E78.5     15. Heart palpitations  R00.2     16. Moderate persistent asthma, unspecified whether complicated  J45.40     17. History of right knee joint replacement  Z96.651     18. Umbilical hernia without obstruction and without gangrene  K42.9     19. Hearing loss of left ear, unspecified hearing loss type  H91.92     20. Eosinophilia, unspecified type  D72.10        Reviewed/updated/encouraged completion: Immunization History  Administered Date(s) Administered   Fluad Quad(high Dose 65+) 11/21/2019   INFLUENZA, HIGH DOSE SEASONAL PF 12/01/2022, 12/05/2023   Influenza Whole 12/11/2010   Influenza,inj,Quad PF,6+ Mos 12/01/2021   Influenza,trivalent, recombinat, inj, PF 12/11/2010, 12/28/2011, 12/15/2012   Influenza-Unspecified 12/01/2021   Moderna Covid-19 Fall Seasonal Vaccine 26yrs & older 02/18/2022, 12/01/2022   Moderna Covid-19 Vaccine Bivalent Booster 62yrs & up 05/15/2019, 06/12/2019, 01/19/2021   Moderna SARS-COV2 Booster Vaccination 01/16/2020, 07/10/2020, 01/19/2021   Pneumococcal Polysaccharide-23 12/28/2011   Pneumococcal-Unspecified 07/04/2017   Tdap 07/05/2014, 05/12/2017   Zoster, Live 07/03/2014   Zoster, Unspecified 05/15/2017, 08/13/2017   Health Maintenance Due  Topic Date Due   Zoster Vaccines- Shingrix (1 of 2) 07/11/1970   Pneumococcal Vaccine: 50+ Years (2 of 2 - PCV) 07/05/2018   Health Maintenance  Topic Date Due   Zoster Vaccines- Shingrix (1 of 2) 07/11/1970   Pneumococcal  Vaccine: 50+ Years (2 of 2 - PCV) 07/05/2018   COVID-19 Vaccine (5 - 2025-26 season) 03/01/2024  (Originally 11/14/2023)   Medicare Annual Wellness (AWV)  01/02/2025   DTaP/Tdap/Td (3 - Td or Tdap) 05/13/2027   Colonoscopy  06/06/2032   Influenza Vaccine  Completed   Hepatitis C Screening  Completed   Meningococcal B Vaccine  Aged Out    Reviewed the following verbally with patient and provided AVS materials:  HEALTH MAINTENANCE COUNSELING AND ANTICIPATORY GUIDANCE    Preventive Measure Recommendation  Eye Exams Every 1-2 years  Dental Care Cleanings every 6 months or more, brush/floss 3x daily  Sinus Care Saline spray rinses daily  Sleep 8 hours nightly, good sleep hygiene, e-monitoring if any daytime drowsiness  Diet Fruits/vegetables/fiber/healthy fats, balance and moderation  Exercise 150 minutes weekly  Risk Behaviors Discouraged any/all high risk behaviors    CANCER SCREENING SHARED DECISION MAKING    Thyroid  Thyroid  was palpated for nodules today.  Prostate Individualized risks/benefits/costs discussed He is on surveillance Lab Results  Component Value Date   PSA 0.00 Repeated and verified X2. (L) 03/29/2022    Colon HM Colonoscopy          Upcoming     Colonoscopy (Every 10 Years) Next due on 06/06/2032    06/07/2022  Done - cologuard completed   Only the first 1 history entries have been loaded, but more history exists.                Lung Current guidelines recommend individuals aged 64 to 77 who currently smoke or formerly smoked and have a >= 20 pack-year smoking history should undergo annual screening with low-dose computed tomography (LDCT). Tobacco Use: Low Risk  (02/14/2024)   Patient History    Smoking Tobacco Use: Never    Smokeless Tobacco Use: Never    Passive Exposure: Not on file   Social History   Tobacco Use  Smoking Status Never  Smokeless Tobacco Never    Skin Advised regular sunscreen use. Patient denies worrisome, changing, or new skin lesions. Offered to include images in chart for surveillance. Showed patient these pictures  of melanomas for reference to educate for self-monitoring.  Other Cancers Discussed lack of screening guidelines and insurance coverage for other cancer types.    Discussed the use of AI scribe software for clinical note transcription with the patient, who gave verbal consent to proceed.  History of Present Illness  73 year old male with a history of renal cell carcinoma and prostate cancer who presents for an annual physical exam.  He is particularly focused on cancer screenings due to his personal history of renal cell carcinoma and prostate cancer. He is in the process of scheduling an appointment with [facility] for further evaluation, including a CT scan and blood work. The patient recalls having genetic testing about five years ago, which he remembers as indicating he was likely to have another cancer, but he does not recall the details. His family history is significant for multiple myeloma in his father, pancreatic and liver cancer in his mother, and colon cancer in his paternal grandparents.  He has had a lymph node scare in his neck. He also mentions a distended abdomen, which was evaluated in [place]  about ten years ago and was deemed normal. A CT scan in 2024 showed kidney cysts.  His asthma worsens in the winter, particularly at night, requiring more frequent use of rescue inhalers. He believes he is up to date on  his pneumonia, flu, and COVID vaccinations, but is unsure about the RSV vaccine.  He is recovering from knee replacement surgery three months ago and is experiencing tightness in the calf, for which he wears a sleeve to help with swelling. He has not been scanned for blood clots but plans to follow up with Dr. Beverley soon. His balance is better on the right side, where he had the knee surgery.  His brother died of a massive heart attack a year and a half ago, and he mentions that his brother was a heavy drinker. He drinks beer but not excessively, especially during this  time of year.    ROS A comprehensive ROS was negative for any concerning symptoms.   Completed medication reconciliation: Current Outpatient Medications on File Prior to Visit  Medication Sig   albuterol  (VENTOLIN  HFA) 108 (90 Base) MCG/ACT inhaler Inhale 2 puffs into the lungs every 6 (six) hours as needed for wheezing or shortness of breath.   amLODipine  (NORVASC ) 10 MG tablet Take 1 tablet by mouth once daily   Ascorbic Acid (VITAMIN C PO) Take 1 tablet by mouth once. 500mg    aspirin  EC 81 MG tablet Take 1 tablet (81 mg total) by mouth daily. Swallow whole.   BREYNA  160-4.5 MCG/ACT inhaler Inhale 2 puffs by mouth twice daily   butalbital -acetaminophen-caffeine  (FIORICET) 50-325-40 MG tablet Take 1 tablet by mouth every 6 (six) hours as needed for headache.   calcium  carbonate (TUMS - DOSED IN MG ELEMENTAL CALCIUM ) 500 MG chewable tablet Chew 1 tablet by mouth as needed.   Calcium  Carbonate-Vit D-Min (CALCIUM  1200 PO) Take 1 tablet by mouth daily.   Cholecalciferol (VITAMIN D3) 50 MCG (2000 UT) TABS Take 1 tablet by mouth daily.   cycloSPORINE  (RESTASIS ) 0.05 % ophthalmic emulsion INSTILL 1 DROP INTO EACH EYE TWICE DAILY   cycloSPORINE  (RESTASIS ) 0.05 % ophthalmic emulsion 1 drop 2 (two) times daily.   fluticasone  (FLONASE ) 50 MCG/ACT nasal spray Place 2 sprays into both nostrils daily.   irbesartan  (AVAPRO ) 300 MG tablet Take 1 tablet by mouth once daily   loteprednol (LOTEMAX) 0.5 % ophthalmic suspension 1 drop 4 (four) times daily.   Multiple Vitamin (MULTIVITAMIN) tablet Take 1 tablet by mouth daily.   Omega-3 Fatty Acids (FISH OIL) 1200 MG CAPS Take 1,200 mg by mouth in the morning and at bedtime.   Polyethyl Glycol-Propyl Glycol (SYSTANE FREE OP) Apply to eye.   pregabalin  (LYRICA ) 200 MG capsule Take 1 capsule (200 mg total) by mouth at bedtime.   Respiratory Therapy Supplies MISC Filters Hoses tubing masks headgear cleaning supplies for dreamwear and  philips respironics bipap  machine.   rosuvastatin  (CRESTOR ) 20 MG tablet Take 1 tablet (20 mg total) by mouth daily.   Saline (SIMPLY SALINE) 0.9 % AERS Place 2 each into the nose as directed. Use nightly for sinus hygiene long-term.  Can also be used as many times daily as desired to assist with clearing congested sinuses.   sildenafil (REVATIO) 20 MG tablet 1 tablet Orally Once a day (Patient not taking: Reported on 12/05/2023)   Spacer/Aero-Holding Chambers (AEROCHAMBER MV) inhaler Use as directed with albuterol  inhaler.   TURMERIC PO Take 1,000 mg by mouth in the morning and at bedtime.   vitamin B-12 (CYANOCOBALAMIN) 500 MCG tablet Take 1 tablet by mouth daily.   zolpidem  (AMBIEN ) 10 MG tablet Take 1 tablet (10 mg total) by mouth at bedtime as needed for sleep. Can NOT take with opioid pain medicine, alcohol, or  other sedative.  Risk dementia/mornings impaired alertness   No current facility-administered medications on file prior to visit.   Medications Discontinued During This Encounter  Medication Reason   citalopram  (CELEXA ) 10 MG tablet Reorder   venlafaxine  (EFFEXOR ) 100 MG tablet   The following were reviewed and/or entered/updated into our electronic MEDICAL RECORD NUMBERPast Medical History:  Diagnosis Date   Allergy 1953   Altered olfactory perception 05/31/2022   Smells cigarettes sometimes     Anxiety    Arthritis    Asthma    Back wound 03/29/2022   BPH (benign prostatic hypertrophy)    BPH (benign prostatic hypertrophy)    Cancer (HCC)    Cataract    Cervical lymphadenopathy 12/08/2022   Chronic kidney disease    Depression    Generalized abdominal pain 03/29/2022   GERD (gastroesophageal reflux disease)    History of kidney cancer    History of prostate cancer 11/30/2021   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    Medication management 01/05/2024   OSA  (obstructive sleep apnea) 11/30/2021   Restless legs 11/30/2021   Seasonal allergies    Sleep apnea    Past Surgical History:  Procedure Laterality Date   CATARACT EXTRACTION, BILATERAL Bilateral 11/2022   10/24 left   NASAL SINUS SURGERY  2019   RENAL CRYOABLATION     TONSILLECTOMY     Social History   Socioeconomic History   Marital status: Married    Spouse name: Not on file   Number of children: 3   Years of education: 12   Highest education level: 12th grade  Occupational History   Occupation: Retired  Tobacco Use   Smoking status: Never   Smokeless tobacco: Never  Vaping Use   Vaping status: Never Used  Substance and Sexual Activity   Alcohol use: Yes    Alcohol/week: 10.0 standard drinks of alcohol    Types: 10 Standard drinks or equivalent per week    Comment: Varies sometimes not at all   Drug use: No   Sexual activity: Not Currently  Other Topics Concern   Not on file  Social History Narrative   Right handed   Caffeine  none   Lives in one story home   Retired    Lives with wife   Social Drivers of Corporate Investment Banker Strain: Low Risk  (12/30/2023)   Overall Financial Resource Strain (CARDIA)    Difficulty of Paying Living Expenses: Not hard at all  Food Insecurity: No Food Insecurity (12/30/2023)   Hunger Vital Sign    Worried About Running Out of Food in the Last Year: Never true    Ran Out of Food in the Last Year: Never true  Transportation Needs: No Transportation Needs (12/30/2023)   PRAPARE - Administrator, Civil Service (Medical): No    Lack of Transportation (Non-Medical): No  Physical Activity: Unknown (12/30/2023)   Exercise Vital Sign    Days of Exercise per Week: Patient declined    Minutes of Exercise per Session: Not on file  Recent Concern: Physical Activity - Insufficiently Active (10/14/2023)   Exercise Vital Sign    Days of Exercise per Week: 4 days    Minutes of Exercise per Session: 30 min  Stress: Stress  Concern Present (12/30/2023)   [employer] of Occupational Health - Occupational Stress Questionnaire  Feeling of Stress: To some extent  Social Connections: Socially Integrated (12/30/2023)   Social Connection and Isolation Panel    Frequency of Communication with Friends and Family: Twice a week    Frequency of Social Gatherings with Friends and Family: Once a week    Attends Religious Services: More than 4 times per year    Active Member of [employer] or Organizations: Yes    Attends Banker Meetings: 1 to 4 times per year    Marital Status: Married  Catering Manager Violence: Not At Risk (01/03/2024)   Humiliation, Afraid, Rape, and Kick questionnaire    Fear of Current or Ex-Partner: No    Emotionally Abused: No    Physically Abused: No    Sexually Abused: No      12/30/2023    1:52 PM  Alcohol Use Disorder Test (AUDIT)  1. How often do you have a drink containing alcohol? 3  2. How many drinks containing alcohol do you have on a typical day when you are drinking? 0  3. How often do you have six or more drinks on one occasion? 0  AUDIT-C Score 3   4. How often during the last year have you found that you were not able to stop drinking once you had started? 0  5. How often during the last year have you failed to do what was normally expected from you because of drinking? 0  6. How often during the last year have you needed a first drink in the morning to get yourself going after a heavy drinking session? 0  7. How often during the last year have you had a feeling of guilt of remorse after drinking? 0  8. How often during the last year have you been unable to remember what happened the night before because you had been drinking? 0  9. Have you or someone else been injured as a result of your drinking? 0  10. Has a relative or friend or a doctor or another health worker been concerned about your drinking or suggested you cut down? 0  Alcohol Use Disorder  Identification Test Final Score (AUDIT) 3      Patient-reported   Family History  Problem Relation Age of Onset   Emphysema Mother    Liver cancer Mother    Pancreatic cancer Mother    Anxiety disorder Mother    Cancer Mother    Hypertension Mother    Multiple myeloma Father    Cancer Father    Hypertension Father    Cancer Paternal Grandmother    Colon cancer Paternal Grandmother    Cancer Paternal Grandfather    Colon cancer Paternal Grandfather    Allergies  Allergen Reactions   Shellfish Allergy Rash, Shortness Of Breath and Swelling   Shellfish Protein-Containing Drug Products Shortness Of Breath   Social History   Substance and Sexual Activity  Sexual Activity Not Currently  @    01/05/2024   10:02 AM  Depression screen PHQ 2/9  Decreased Interest 1  Down, Depressed, Hopeless 1  PHQ - 2 Score 2  Altered sleeping 3  Tired, decreased energy 1  Change in appetite 0  Feeling bad or failure about yourself  1  Trouble concentrating 1  Moving slowly or fidgety/restless 0  Suicidal thoughts 1  PHQ-9 Score 9   Difficult doing work/chores Not difficult at all     Data saved with a previous flowsheet row definition      12/30/2023  1:48 PM  Fall Risk   Falls in the past year? 0  Number falls in past yr: 0  Injury with Fall? 0   Risk for fall due to : No Fall Risks  Follow up Falls prevention discussed     Data saved with a previous flowsheet row definition     BP 120/62   Pulse 70   Temp 98 F (36.7 C) (Temporal)   Ht 5' 9 (1.753 m)   Wt 190 lb 3.2 oz (86.3 kg)   SpO2 98%   BMI 28.09 kg/m  BP Readings from Last 3 Encounters:  02/14/24 120/62  01/05/24 120/62  12/05/23 120/70   Wt Readings from Last 10 Encounters:  02/14/24 190 lb 3.2 oz (86.3 kg)  01/05/24 185 lb 12.8 oz (84.3 kg)  01/03/24 180 lb (81.6 kg)  12/05/23 185 lb (83.9 kg)  11/04/23 182 lb 12.8 oz (82.9 kg)  10/17/23 185 lb (83.9 kg)  08/04/23 187 lb 9.6 oz (85.1 kg)   07/08/23 188 lb (85.3 kg)  07/06/23 188 lb 2 oz (85.3 kg)  04/28/23 187 lb (84.8 kg)  Physical Exam GEN: No acute distress, resting comfortably. HEENT: Tympanic membranes normal appearing bilaterally, oropharynx clear, no thyromegaly noted, no palpable lymphadenopathy or thyroid  nodules.Left ear with cerumen impaction, right ear healthy. CARDIOVASCULAR: S1 and S2 heart sounds with regular rate and rhythm, no murmurs appreciated. PULMONARY: Normal work of breathing, clear to auscultation bilaterally, no crackles, wheezes, or rhonchi. ABDOMEN: Soft, nontender, nondistended. MSK: No edema, cyanosis, or clubbing noted. SKIN: Warm, dry, no lesions of concern observed. Images of back taken below for monitoring he following with derm. NEUROLOGICAL: Cranial nerves II-XII grossly intact, strength 5/5 in upper and lower extremities, reflexes symmetric and intact bilaterally.Balance not as good, better on the right knee. PSYCH: Normal affect and thought content, pleasant and cooperative. Photographs Taken 02/15/2024 :    ======================================  IMPORTANT HEALTH REMINDERS: Report any new or changing skin lesions promptly Maintain recommended screening schedules Discuss any new family history of cancer at future visits Follow up on any new symptoms that persist more than two weeks      Notes:  This document was synthesized by artificial intelligence (Abridge) using HIPAA-compliant recording of the clinical interaction;   We discussed the use of AI scribe software for clinical note transcription with the patient, who gave verbal consent to proceed.    This encounter employed state-of-the-art, real-time, collaborative documentation. The patient was empowered to actively review and assist in updating their electronic medical record on a shared monitor, ensuring transparency and improving accuracy.    Prior to and at the beginning of Comprehensive Physical Exam (CPE) preventive care  annual visit appointment types  we clarify to patients Our goal today is to focus on your preventive or annual Comprehensive Physical Exam (CPE) preventive care annual visit, which typically covers routine screenings and overall health maintenance. However, if you share any new or concerning symptoms--such as dizziness, passing out, severe pain, or anything else that may point to a more serious issue--we are both legally and ethically required to evaluate it. We cannot simply overlook or ignore such concerns, even if you later decide you don't want to discuss them, because it could jeopardize your health.  If addressing a new concern takes us  beyond the scope of the preventive visit, we may need to bill separately for that portion of care. We understand financial considerations are important, and we're happy to discuss your options if something new comes  up. However, we want to be clear that once you mention a potentially serious issue, we must investigate it; we can't ethically or legally exclude that from our records or our evaluation. Please let us  know all of your questions or worries. Together, we can decide how best to manage them and how to minimize any unexpected costs, but we want to keep you safe above all else.   This disclosure is mandated by professional ethics and legal obligations, as healthcare providers must address any substantial health concerns raised during any patient interaction and a comprehensive ROS is required by insurance companies for billing preventive-care visit type.   This disclosure ultimately discourages patients financially from reporting significant health issues.   Medical Screening Exam A medical screening exam (MSE) helps to determine whether you need immediate medical treatment relating to any number of symptoms you are having. This type of exam may be done in an emergency department, an urgent care setting, or your health care provider's office. Depending on your  symptoms and severity, you may need additional tests or medical therapy. It is important to note that an MSE does not necessarily mean that you will need or receive further medical testing or interventions if your symptoms are not deemed to be medically urgent (emergent). Tell a health care provider about: Any allergies you have. All medicines you are taking, including vitamins, herbs, eye drops, creams, and over-the-counter medicines. Any problems you or family members have had with anesthetic medicines. Any bleeding problems you have. Any surgeries you have had. Any medical conditions you have. Whether you are pregnant or may be pregnant. What happens during the test? During the exam, a health care provider does a short, often focused, physical exam and asks about your medical history to assess: Your current symptoms. Your overall health. Your need for possible further medical intervention. What can I expect after the test? If you have a regular health care provider, make an appointment for a follow-up visit with him or her. If you do not have a regular health care provider, ask about resources in your community. Your medical screening exam may determine that: You do not need emergency treatment at this time. You need treatment right away. You need to be transferred to another medical center. This may happen if you need an emergent specialist or consultant that is not available at the medical center you are at. You need to have more tests. A medical specialist may be consulted if needed. Get help right away if: Your condition gets worse. You develop new or troubling symptoms before you see your health care provider. These symptoms may represent a serious problem that is an emergency. Do not wait to see if the symptoms will go away. Get medical help right away. Call your local emergency services (911 in the [place] Do not drive yourself to the hospital. Summary A medical screening exam  helps to determine whether you need medical treatment right away. This type of exam may be done in an emergency department, an urgent care setting, or your health care provider's office. During the exam, a health care provider does a short physical exam and asks about your current symptoms and overall health. Depending on the exam, more tests or therapies may be ordered. However, an MSE does not necessarily mean that you will have further medical testing if your symptoms are not deemed to be urgent. If you need further care that is not offered at your current medical center, you may need to  be transferred to another facility. This information is not intended to replace advice given to you by your health care provider. Make sure you discuss any questions you have with your health care provider. Document Revised: 11/12/2020 Document Reviewed: 07/10/2020 [employer] Patient Education  2024 [employer]   Health Maintenance, Male Adopting a healthy lifestyle and getting preventive care are important in promoting health and wellness. Ask your health care provider about: The right schedule for you to have regular tests and exams. Things you can do on your own to prevent diseases and keep yourself healthy. What should I know about diet, weight, and exercise? Eat a healthy diet  Eat a diet that includes plenty of vegetables, fruits, low-fat dairy products, and lean protein. Do not eat a lot of foods that are high in solid fats, added sugars, or sodium. Maintain a healthy weight Body mass index (BMI) is a measurement that can be used to identify possible weight problems. It estimates body fat based on height and weight. Your health care provider can help determine your BMI and help you achieve or maintain a healthy weight. Get regular exercise Get regular exercise. This is one of the most important things you can do for your health. Most adults should: Exercise for at least 150 minutes each week. The  exercise should increase your heart rate and make you sweat (moderate-intensity exercise). Do strengthening exercises at least twice a week. This is in addition to the moderate-intensity exercise. Spend less time sitting. Even light physical activity can be beneficial. Watch cholesterol and blood lipids Have your blood tested for lipids and cholesterol at 72 years of age, then have this test every 5 years. You may need to have your cholesterol levels checked more often if: Your lipid or cholesterol levels are high. You are older than 72 years of age. You are at high risk for heart disease. What should I know about cancer screening? Many types of cancers can be detected early and may often be prevented. Depending on your health history and family history, you may need to have cancer screening at various ages. This may include screening for: Colorectal cancer. Prostate cancer. Skin cancer. Lung cancer. What should I know about heart disease, diabetes, and high blood pressure? Blood pressure and heart disease High blood pressure causes heart disease and increases the risk of stroke. This is more likely to develop in people who have high blood pressure readings or are overweight. Talk with your health care provider about your target blood pressure readings. Have your blood pressure checked: Every 3-5 years if you are 1-33 years of age. Every year if you are 8 years old or older. If you are between the ages of 79 and 9 and are a current or former smoker, ask your health care provider if you should have a one-time screening for abdominal aortic aneurysm (AAA). Diabetes Have regular diabetes screenings. This checks your fasting blood sugar level. Have the screening done: Once every three years after age 70 if you are at a normal weight and have a low risk for diabetes. More often and at a younger age if you are overweight or have a high risk for diabetes. What should I know about preventing  infection? Hepatitis B If you have a higher risk for hepatitis B, you should be screened for this virus. Talk with your health care provider to find out if you are at risk for hepatitis B infection. Hepatitis C Blood testing is recommended for: Everyone born from  1944 through [birth year]. Anyone with known risk factors for hepatitis C. Sexually transmitted infections (STIs) You should be screened each year for STIs, including gonorrhea and chlamydia, if: You are sexually active and are younger than 72 years of age. You are older than 72 years of age and your health care provider tells you that you are at risk for this type of infection. Your sexual activity has changed since you were last screened, and you are at increased risk for chlamydia or gonorrhea. Ask your health care provider if you are at risk. Ask your health care provider about whether you are at high risk for HIV. Your health care provider may recommend a prescription medicine to help prevent HIV infection. If you choose to take medicine to prevent HIV, you should first get tested for HIV. You should then be tested every 3 months for as long as you are taking the medicine. Follow these instructions at home: Alcohol use Do not drink alcohol if your health care provider tells you not to drink. If you drink alcohol: Limit how much you have to 0-2 drinks a day. Know how much alcohol is in your drink. In the [place] one drink equals one 12 oz bottle of beer (355 mL), one 5 oz glass of wine (148 mL), or one 1 oz glass of hard liquor (44 mL). Lifestyle Do not use any products that contain nicotine or tobacco. These products include cigarettes, chewing tobacco, and vaping devices, such as e-cigarettes. If you need help quitting, ask your health care provider. Do not use street drugs. Do not share needles. Ask your health care provider for help if you need support or information about quitting drugs. General instructions Schedule regular health,  dental, and eye exams. Stay current with your vaccines. Tell your health care provider if: You often feel depressed. You have ever been abused or do not feel safe at home. Summary Adopting a healthy lifestyle and getting preventive care are important in promoting health and wellness. Follow your health care provider's instructions about healthy diet, exercising, and getting tested or screened for diseases. Follow your health care provider's instructions on monitoring your cholesterol and blood pressure. This information is not intended to replace advice given to you by your health care provider. Make sure you discuss any questions you have with your health care provider. Document Revised: 07/21/2020 Document Reviewed: 07/21/2020 [employer] Patient Education  2024 [employer].

## 2024-02-14 NOTE — Patient Instructions (Addendum)
 Recommend check with pharmacy about RSV shot   VISIT SUMMARY: You came in today for your annual physical exam. We discussed your history of renal cell carcinoma and prostate cancer, and your focus on cancer screenings. Your recent blood work was normal, and your vaccinations are up to date. We also reviewed your asthma, recent knee replacement surgery, and other health concerns.  YOUR PLAN: -ADULT WELLNESS VISIT: Your annual physical exam was conducted, and your recent blood work was normal, including a PSA of 0.0 and an exceptionally low LDL cholesterol level of 19. There are no signs of cancer or cardiovascular issues. Your vaccinations are current, including pneumonia, flu, and COVID. An RSV vaccine is recommended due to your asthma.  -PERSONAL HISTORY OF MALIGNANT NEOPLASM OF KIDNEY AND PROSTATE: Your renal cell carcinoma and prostate cancer are in remission. You are awaiting a urology appointment for further evaluation and management. Previous genetic testing indicated a likelihood of recurrence or new cancer, but there is no current need for further genetic testing.  -SCREENING FOR MALIGNANT NEOPLASM OF COLON AND PROSTATE: Given your family history of colon cancer, you are awaiting a urology appointment for prostate cancer screening. Previous genetic testing indicated a likelihood of recurrence or new cancer, but no current need for further genetic testing.  -SCREENING FOR CARDIOVASCULAR DISORDERS AND LIPOID DISORDERS: Your cholesterol levels are excellent with an LDL of 19, and there are no signs of cardiovascular issues. Despite your family history of heart attack, you have no personal history of cardiovascular events. Continue current management and monitoring of your cholesterol levels.  -ASTHMA: Your asthma symptoms worsen in winter, especially at night, leading to increased use of rescue inhalers. Your vaccinations are up to date, including pneumonia and COVID. An RSV vaccine is recommended  due to your asthma.  -RIGHT KNEE JOINT REPLACEMENT, POST-SURGICAL STATE WITH SWELLING OF RIGHT LOWER LEG: You are recovering from right knee joint replacement surgery three months ago. The swelling in your right lower leg may be due to a blood clot, but there is no tenderness or signs of infection. Your balance is better on the right side. Continue follow-up with your orthopedic surgeon for evaluation of swelling and balance issues.  -UMBILICAL HERNIA: An umbilical hernia was noted on a CT scan from 2024. There are no signs of complications or need for intervention at this time. Continue to monitor for any changes or symptoms.  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 There are no acute changes. Continue current management.  -ALLERGIC RHINITIS: Your eosinophils are elevated due to allergies, but there are no acute changes. Continue current management.  INSTRUCTIONS: Please follow up with your urology appointment for further evaluation and management of your renal cell carcinoma and prostate cancer. Cont inue to monitor your asthma symptoms and use your rescue inhaler as needed. Schedule an appointment with your orthopedic surgeon to evaluate the swelling in your right lower leg. Consider getting the RSV vaccine due to your asthma. Continue to monitor your umbilical hernia for any changes or symptoms.  Building Your Long-Term Health Plan  During today's preventive visit, we covered a variety of important health checks to help you stay on top of your well-being.  We also discussed strategies to maintain your health and identified some areas that might benefit from further exploration.   Preventive care visits like today's are designed to be proactive, but sometimes additional attention may be needed.  Rest assured,  we're here for you.  If these areas require further evaluation or management, we'd be happy to schedule a separate,  focused appointment to address them in detail.  Addressing Next Steps  [x]   Follow-up Visit: To ensure we address any unresolved issues and continue monitoring your overall health, we recommend scheduling a follow-up appointment in 1 year for your next preventive care visit. If you experience any new problems, need to discuss any medical concerns, or your condition worsens before then, please don't hesitate to call our office to schedule an appointment or seek emergency care as needed.  [x]   Preventive Measures: Maintaining healthy habits plays a crucial role in overall wellness. We recommend considering these tips: [x]   Regular appointments with dental and vision professionals [x]   Nightly nasal saline mist to keep sinuses clear [x]   Consistent toothbrushing to maintain oral health [x]   Using an app like [employer]ore[employer] to track sleep quality [x]   Routine checks of blood pressure and heart rate [x]   Medical Information: In some instances, we may require additional medical information from other providers to create a comprehensive picture of your health. If applicable, we can provide a medical information release form at the front desk for you to sign, allowing us  to gather these records. [x]   Lab Tests: If any lab tests were ordered today, scheduling them within a week of your visit helps ensure the best possible insurance coverage.  Planning Follow Up to Work on a Problem? Make the Most of Our Focused (20 minute) Appointments  [x]   Clearly state your top concerns at the beginning of the visit to focus our discussion [x]   If you anticipate you will need more time, please inform the front desk during scheduling - we can book multiple appointments in the same week. [x]   If you have transportation problems- use our convenient video appointments or ask about transportation support. [x]   We can get down to business faster if you use MyChart to update information before the visit and submit non-urgent  questions before your visit. Thank you for taking the time to provide details through MyChart.  Let our nurse know and she can import this information into your encounter documents.  Arrival and Wait Times  [x]   Arriving on time ensures that everyone receives prompt attention. [x]   Early morning (8a) and afternoon (1p) appointments tend to have shortest wait times. [x]   Unfortunately, we cannot delay appointments for late arrivals or hold slots during phone calls.  Bring to Your Next Appointment:  [x]   Medications: Please bring all your medication bottles to your next appointment to ensure we have an accurate record of your prescriptions. [x]   Health Diaries: If you're monitoring any health conditions at home, keeping a diary of your readings can be very helpful for discussions at your next appointment.  Reviewing Your Records  [x]   Review your attached preventive care information at the end of these patient instructions. [x]   Review this early draft of your clinical encounter notes below and the final encounter summary tomorrow on MyChart after its been completed.      Getting Answers and Following Up  [x]   Simple Questions & Concerns: For quick questions or basic follow-up after your visit, reach us  at [phone] or MyChart messaging. [x]   Complex Concerns: If your concern is more complex, scheduling an appointment might be best. Discuss this with the staff to find the most suitable option. [x]   Lab & Imaging Results: We'll contact you directly if results are abnormal  or you don't use MyChart. Most normal results will be on MyChart within 2-3 business days, with a review message from Dr. Jesus. Haven't heard back in 2 weeks? Need results sooner? Contact us  at [phone]. [x]   Referrals: Our referral coordinator will manage specialist referrals. The specialist's office should contact you within 2 weeks to schedule an appointment. Call us  if you haven't heard from them after 2  weeks.  Staying Connected  [x]   MyChart: Activate your MyChart for the fastest way to access results and message us . See the last page of this paperwork for instructions on how to activate.  Billing  [x]   X-ray & Lab Orders: These are billed by separate companies. Contact the invoicing company directly for questions or concerns. [x]   Visit Charges: Discuss any billing inquiries with our administrative services team.  Your Satisfaction Matters  [x]   Share Your Experience: We strive for your satisfaction! If you have any complaints, or preferably compliments, please let Dr. Jesus know directly or contact our Practice Administrators, Manuelita Rubin or [employer], by asking at the front desk.                 Next Steps  [x]   Schedule Follow-Up:  We recommend a follow-up appointment in 1 year for your next wellness visit.  If you develop any new problems, want to address any medical issues, or your condition worsens before then, please call us  for an appointment or seek emergency care. [x]   Preventive Care:  Make sure to keep regular appointments with dental and vision professionals, use nightly nasal saline mist sprays to keep your sinuses clear and toothbrushing to protect your teeth. Use [employer]ore[employer] App or other app to track your sleep quality. Check blood pressure and heart rate routinely. [x]   Medical Information Release:  For any relevant medical information we don't have, please sign a release form at the front desk so we can obtain it for your records. [x]   Lab Tests:  Schedule any lab tests from today for within a week to ensure best insurance coverage.    Making the Most of Our Focused (20 minute) Appointments:  [x]   Clearly state your top concerns at the beginning of the visit to focus our discussion [x]   If you anticipate you will need more time, please inform the front desk during scheduling - we can book multiple appointments in the same week. [x]   If you have  transportation problems- use our convenient video appointments or ask about transportation support. [x]   We can get down to business faster if you use MyChart to update information before the visit and submit non-urgent questions before your visit. Thank you for taking the time to provide details through MyChart.  Let our nurse know and she can import this information into your encounter documents.  Arrival and Wait Times: [x]   Arriving on time ensures that everyone receives prompt attention. [x]   Early morning (8a) and afternoon (1p) appointments tend to have shortest wait times. [x]   Unfortunately, we cannot delay appointments for late arrivals or hold slots during phone calls.  Bring to Your Next Appointment  [x]   Medications: Please bring all your medication bottles to your next appointment to ensure we have an accurate record of your prescriptions. [x]   Health Diaries: If you're monitoring any health conditions at home, keeping a diary of your readings can be very helpful for discussions at your next appointment.  Reviewing Your Records  [x]   Review your attached preventive care information  at the end of these patient instructions. [x]   Review this early draft of your clinical encounter notes below and the final encounter summary tomorrow on MyChart after its been completed.   Encounter for annual general medical examination with abnormal findings in adult -     Citalopram  Hydrobromide; Take 1 tablet (10 mg total) by mouth daily.  Dispense: 90 tablet; Refill: 4  Screening for cardiovascular condition  Screening declined by patient  Screening for malignant neoplasm of colon  Screening for diabetes mellitus  Screening for lipoid disorders  Screening examination for infectious disease  Screening for malignant neoplasm of prostate  Cervical lymphadenopathy  History of renal cell carcinoma  Prostate cancer (HCC)  Restless leg syndrome  Leg swelling  Hyperlipidemia,  acquired  Heart palpitations  Moderate persistent asthma, unspecified whether complicated  History of right knee joint replacement  Umbilical hernia without obstruction and without gangrene  Hearing loss of left ear, unspecified hearing loss type  Eosinophilia, unspecified type     Getting Answers and Following Up  [x]   Simple Questions & Concerns: For quick questions or basic follow-up after your visit, reach us  at [phone] or MyChart messaging. [x]   Complex Concerns: If your concern is more complex, scheduling an appointment might be best. Discuss this with the staff to find the most suitable option. [x]   Lab & Imaging Results: We'll contact you directly if results are abnormal or you don't use MyChart. Most normal results will be on MyChart within 2-3 business days, with a review message from Dr. Jesus. Haven't heard back in 2 weeks? Need results sooner? Contact us  at [phone]. [x]   Referrals: Our referral coordinator will manage specialist referrals. The specialist's office should contact you within 2 weeks to schedule an appointment. Call us  if you haven't heard from them after 2 weeks.  Staying Connected  [x]   MyChart: Activate your MyChart for the fastest way to access results and message us . See the last page of this paperwork for instructions on how to activate.  Billing  [x]   X-ray & Lab Orders: These are billed by separate companies. Contact the invoicing company directly for questions or concerns. [x]   Visit Charges: Discuss any billing inquiries with our administrative services team.  Your Satisfaction Matters  [x]   Share Your Experience: We strive for your satisfaction! If you have any complaints, or preferably compliments, please let Dr. Jesus know directly or contact our Practice Administrators, Manuelita Rubin or [employer], by asking at the front desk.    Medical Screening Exam A medical screening exam (MSE) helps to determine whether you  need immediate medical treatment relating to any number of symptoms you are having. This type of exam may be done in an emergency department, an urgent care setting, or your health care provider's office. Depending on your symptoms and severity, you may need additional tests or medical therapy. It is important to note that an MSE does not necessarily mean that you will need or receive further medical testing or interventions if your symptoms are not deemed to be medically urgent (emergent). Tell a health care provider about: Any allergies you have. All medicines you are taking, including vitamins, herbs, eye drops, creams, and over-the-counter medicines. Any problems you or family members have had with anesthetic medicines. Any bleeding problems you have. Any surgeries you have had. Any medical conditions you have. Whether you are pregnant or may be pregnant. What happens during the test? During the exam, a health care provider does a  short, often focused, physical exam and asks about your medical history to assess: Your current symptoms. Your overall health. Your need for possible further medical intervention. What can I expect after the test? If you have a regular health care provider, make an appointment for a follow-up visit with him or her. If you do not have a regular health care provider, ask about resources in your community. Your medical screening exam may determine that: You do not need emergency treatment at this time. You need treatment right away. You need to be transferred to another medical center. This may happen if you need an emergent specialist or consultant that is not available at the medical center you are at. You need to have more tests. A medical specialist may be consulted if needed. Get help right away if: Your condition gets worse. You develop new or troubling symptoms before you see your health care provider. These symptoms may represent a serious problem that  is an emergency. Do not wait to see if the symptoms will go away. Get medical help right away. Call your local emergency services (911 in the [place] Do not drive yourself to the hospital. Summary A medical screening exam helps to determine whether you need medical treatment right away. This type of exam may be done in an emergency department, an urgent care setting, or your health care provider's office. During the exam, a health care provider does a short physical exam and asks about your current symptoms and overall health. Depending on the exam, more tests or therapies may be ordered. However, an MSE does not necessarily mean that you will have further medical testing if your symptoms are not deemed to be urgent. If you need further care that is not offered at your current medical center, you may need to be transferred to another facility. This information is not intended to replace advice given to you by your health care provider. Make sure you discuss any questions you have with your health care provider. Document Revised: 11/12/2020 Document Reviewed: 07/10/2020 Elsevier Patient Education  2024 [employer].

## 2024-02-15 ENCOUNTER — Encounter: Payer: Self-pay | Admitting: Internal Medicine

## 2024-02-15 DIAGNOSIS — M25561 Pain in right knee: Secondary | ICD-10-CM | POA: Diagnosis not present

## 2024-02-15 DIAGNOSIS — M1711 Unilateral primary osteoarthritis, right knee: Secondary | ICD-10-CM | POA: Diagnosis not present

## 2024-02-15 NOTE — Assessment & Plan Note (Signed)
 Continue(s) with close surveillance. Personal history of malignant neoplasm of kidney and prostate   Renal cell carcinoma and prostate cancer are in remission. He is awaiting a urology appointment for further evaluation and management. Previous genetic testing indicated a likelihood of recurrence or new cancer, but there is no current need for further genetic testing.

## 2024-02-15 NOTE — Assessment & Plan Note (Signed)
 Asthma   Symptoms worsen in winter, especially at night, leading to increased use of rescue inhalers. Vaccinations are up to date, including pneumonia and COVID. An RSV vaccine is recommended due to asthma.

## 2024-02-15 NOTE — Assessment & Plan Note (Signed)
 Carefully examined, this has resolved and was removed from problem list.

## 2024-02-20 DIAGNOSIS — M5441 Lumbago with sciatica, right side: Secondary | ICD-10-CM | POA: Diagnosis not present

## 2024-02-20 DIAGNOSIS — Z96651 Presence of right artificial knee joint: Secondary | ICD-10-CM | POA: Diagnosis not present

## 2024-02-21 DIAGNOSIS — M9903 Segmental and somatic dysfunction of lumbar region: Secondary | ICD-10-CM | POA: Diagnosis not present

## 2024-02-29 ENCOUNTER — Other Ambulatory Visit: Payer: Self-pay | Admitting: Internal Medicine

## 2024-02-29 DIAGNOSIS — I10 Essential (primary) hypertension: Secondary | ICD-10-CM

## 2024-03-02 DIAGNOSIS — H53143 Visual discomfort, bilateral: Secondary | ICD-10-CM | POA: Diagnosis not present

## 2024-03-02 DIAGNOSIS — H04123 Dry eye syndrome of bilateral lacrimal glands: Secondary | ICD-10-CM | POA: Diagnosis not present

## 2024-03-02 DIAGNOSIS — R94111 Abnormal electroretinogram [ERG]: Secondary | ICD-10-CM | POA: Diagnosis not present

## 2024-03-02 DIAGNOSIS — H538 Other visual disturbances: Secondary | ICD-10-CM | POA: Diagnosis not present

## 2024-03-05 NOTE — Progress Notes (Unsigned)
 "  @Patient  ID: Jay Fox, male    DOB: 08-03-1950, 73 y.o.   MRN: [id number]  No chief complaint on file.   Referring provider: Jesus [name], MD  HPI: 73 year old male seen for sleep consult December 11, 2021 to establish for sleep apnea Medical history significant for restless leg syndrome  TEST/EVENTS :   12/11/2021 Sleep consult  Patient presents for a sleep consult.  Kindly referred by primary care provider Dr. Jesus.  Patient says he was diagnosed with sleep apnea in 2017 living in [place] .  He had a sleep study by Anda Mocha MD in [facility] [place] .  (Phone # [phone]) .  Patient says he wears his BiPAP every single night.  Patient says he did try CPAP initially but could not tolerate.  BiPAP helps him so much because he can actually breathe while he is sleeping.  Has chronic nasal stuffiness.  Patient says usually gets in about 6-7 sometimes 8 hours each night.  He uses [facility].  He is currently using the DreamWear nasal mask.  He says it is most comfortable for him.  He does use [employer] dream station.  We did look up his DreamStation and it is part of the [employer] recall.  I did notify him about this.  Patient says he recently moved back to the area from [place]  about 2 years ago.  Patient has not been able to get any supplies.  His tubing and mask are about 72 years old.  Patient is wanting to establish so she can get new mask and supplies.  Patient says he cannot sleep without his BiPAP so will most likely need a new machine since his is under recall.  Patient says he also has chronic insomnia and restless leg syndrome.  He is currently being followed by neurology for his restless leg taking Requip  and Lyrica  which were recently changed.  He also is recently been changed from Sonata  to Ambien  by his primary care provider.  Patient says typically these medicines will help him to get to sleep and sleep all night with his BiPAP.  He does have some  daytime grogginess first thing in the morning. Typically goes to bed about 11 PM.  Gets up 1 or 2 times each night.  Does take him a while to go to sleep.  Gets up about 8 AM.  Patient is retired.  No significant caffeine  intake.  No history of congestive heart failure or stroke. Epworth score is 2 out of 24.  Has some mild sleepiness if he sits down to rest or in the afternoon hours.   Medical history significant for hypertension, asthma, prostate cancer, kidney cancer, chronic allergies, chronic kidney disease, sleep apnea, restless leg syndrome, anxiety, depression  Family history positive for emphysema and cancer  Surgical history prostate surgery in 2020, left kidney ablation 2021, sinus surgery 2019, tonsillectomy  Social history patient is married.  He is retired personnel officer.  Has 3 adult children.  Never smoker.  Drinks alcohol 5-10 drinks per week.     03/06/2024- Interim hx  Discussed the use of AI scribe software for clinical note transcription with the patient, who gave verbal consent to proceed.  History of Present Illness Jay Fox is a 73 year old male with sleep apnea who presents for an overdue follow-up and CPAP supply renewal.  He has a history of sleep apnea and uses a BiPAP machine nightly, which he finds beneficial for breathing, especially during sinus issues. Initially  diagnosed with mild sleep apnea, he barely qualified for the device and does not recall frequent awakenings at night before using the BiPAP.  He brought in his SD card for data review, but it contained no data. His BiPAP machine was replaced following a recall, and he believes the SD card is the original from 2017. He is unsure why there is no data, speculating it might be due to software updates or the age of the card. He recalls a previous download in 2023 showing EPAP pressure at 6 and IPAP at 10.5. He is currently using a DreamStation auto BiPAP.  He is seeking to renew his CPAP supplies  through [employer], his medical supply store. He has previously purchased filters independently when needed.  No data on SD card, DME [employer]. Received replacement CPAP machine in 2023 from [employer] due to recall.    Allergies[1]  Immunization History  Administered Date(s) Administered   Fluad Quad(high Dose 65+) 11/21/2019   INFLUENZA, HIGH DOSE SEASONAL PF 12/01/2022, 12/05/2023   Influenza Whole 12/11/2010   Influenza,inj,Quad PF,6+ Mos 12/01/2021   Influenza,trivalent, recombinat, inj, PF 12/11/2010, 12/28/2011, 12/15/2012   Influenza-Unspecified 12/01/2021   Moderna Covid-19 Fall Seasonal Vaccine 2yrs & older 02/18/2022, 12/01/2022   Moderna Covid-19 Vaccine Bivalent Booster 48yrs & up 05/15/2019, 06/12/2019, 01/19/2021   Moderna SARS-COV2 Booster Vaccination 01/16/2020, 07/10/2020, 01/19/2021   Pneumococcal Polysaccharide-23 12/28/2011   Pneumococcal-Unspecified 07/04/2017   Tdap 07/05/2014, 05/12/2017   Zoster, Live 07/03/2014   Zoster, Unspecified 05/15/2017, 08/13/2017    Past Medical History:  Diagnosis Date   Allergy 1953   Altered olfactory perception 05/31/2022   Smells cigarettes sometimes     Anxiety    Arthritis    Asthma    Back wound 03/29/2022   BPH (benign prostatic hypertrophy)    BPH (benign prostatic hypertrophy)    Cancer (HCC)    Cataract    Cervical lymphadenopathy 12/08/2022   Chronic kidney disease    Depression    Generalized abdominal pain 03/29/2022   GERD (gastroesophageal reflux disease)    History of kidney cancer    History of prostate cancer 11/30/2021   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    Medication management 01/05/2024   OSA (obstructive sleep apnea) 11/30/2021   Restless legs 11/30/2021   Seasonal allergies    Sleep apnea     Tobacco History: Tobacco Use History[2] Counseling given: Not  Answered   Outpatient Medications Prior to Visit  Medication Sig Dispense Refill   albuterol  (VENTOLIN  HFA) 108 (90 Base) MCG/ACT inhaler Inhale 2 puffs into the lungs every 6 (six) hours as needed for wheezing or shortness of breath. 18 g 11   amLODipine  (NORVASC ) 10 MG tablet Take 1 tablet by mouth once daily 90 tablet 0   Ascorbic Acid (VITAMIN C PO) Take 1 tablet by mouth once. 500mg      aspirin  EC 81 MG tablet Take 1 tablet (81 mg total) by mouth daily. Swallow whole.     BREYNA  160-4.5 MCG/ACT inhaler Inhale 2 puffs by mouth twice daily 11 g 0   butalbital -acetaminophen-caffeine  (FIORICET) 50-325-40 MG tablet Take 1 tablet by mouth every 6 (six) hours as needed for headache. 14 tablet 0   calcium  carbonate (TUMS - DOSED IN MG ELEMENTAL CALCIUM ) 500 MG chewable tablet Chew 1 tablet by mouth as needed.     Calcium  Carbonate-Vit D-Min (CALCIUM   1200 PO) Take 1 tablet by mouth daily.     Cholecalciferol (VITAMIN D3) 50 MCG (2000 UT) TABS Take 1 tablet by mouth daily.     citalopram  (CELEXA ) 10 MG tablet Take 1 tablet (10 mg total) by mouth daily. 90 tablet 4   cycloSPORINE  (RESTASIS ) 0.05 % ophthalmic emulsion INSTILL 1 DROP INTO EACH EYE TWICE DAILY     cycloSPORINE  (RESTASIS ) 0.05 % ophthalmic emulsion 1 drop 2 (two) times daily.     fluticasone  (FLONASE ) 50 MCG/ACT nasal spray Place 2 sprays into both nostrils daily. 16 g 6   irbesartan  (AVAPRO ) 300 MG tablet Take 1 tablet by mouth once daily 90 tablet 0   loteprednol (LOTEMAX) 0.5 % ophthalmic suspension 1 drop 4 (four) times daily.     Multiple Vitamin (MULTIVITAMIN) tablet Take 1 tablet by mouth daily.     Omega-3 Fatty Acids (FISH OIL) 1200 MG CAPS Take 1,200 mg by mouth in the morning and at bedtime.     Polyethyl Glycol-Propyl Glycol (SYSTANE FREE OP) Apply to eye.     pregabalin  (LYRICA ) 200 MG capsule Take 1 capsule (200 mg total) by mouth at bedtime. 90 capsule 0   Respiratory Therapy Supplies MISC Filters Hoses tubing masks  headgear cleaning supplies for dreamwear and  [employer] respironics bipap machine. 2 each 0   rosuvastatin  (CRESTOR ) 20 MG tablet Take 1 tablet (20 mg total) by mouth daily. 90 tablet 1   Saline (SIMPLY SALINE) 0.9 % AERS Place 2 each into the nose as directed. Use nightly for sinus hygiene long-term.  Can also be used as many times daily as desired to assist with clearing congested sinuses. 127 mL 11   sildenafil (REVATIO) 20 MG tablet 1 tablet Orally Once a day (Patient not taking: Reported on 12/05/2023)     Spacer/Aero-Holding Chambers (AEROCHAMBER MV) inhaler Use as directed with albuterol  inhaler. 1 each 0   TURMERIC PO Take 1,000 mg by mouth in the morning and at bedtime.     vitamin B-12 (CYANOCOBALAMIN) 500 MCG tablet Take 1 tablet by mouth daily.     zolpidem  (AMBIEN ) 10 MG tablet Take 1 tablet (10 mg total) by mouth at bedtime as needed for sleep. Can NOT take with opioid pain medicine, alcohol, or other sedative.  Risk dementia/mornings impaired alertness 90 tablet 0   No facility-administered medications prior to visit.    Review of Systems  Review of Systems  Constitutional: Negative.  Negative for fatigue.  Respiratory: Negative.    Psychiatric/Behavioral:  Negative for sleep disturbance.    Physical Exam  There were no vitals taken for this visit. Physical Exam Constitutional:      Appearance: Normal appearance. He is well-developed.  HENT:     Head: Normocephalic and atraumatic.     Mouth/Throat:     Mouth: Mucous membranes are moist.     Pharynx: Oropharynx is clear.  Cardiovascular:     Rate and Rhythm: Normal rate and regular rhythm.     Heart sounds: Normal heart sounds.  Pulmonary:     Effort: Pulmonary effort is normal. No respiratory distress.     Breath sounds: Normal breath sounds. No wheezing or rhonchi.  Musculoskeletal:        General: Normal range of motion.     Cervical back: Normal range of motion and neck supple.  Skin:    General: Skin is warm  and dry.     Findings: No erythema or rash.  Neurological:     General:  No focal deficit present.     Mental Status: He is alert and oriented to person, place, and time. Mental status is at baseline.  Psychiatric:        Mood and Affect: Mood normal.        Behavior: Behavior normal.        Thought Content: Thought content normal.        Judgment: Judgment normal.     Lab Results:  CBC    Component Value Date/Time   WBC 5.0 02/07/2024 0926   RBC 4.64 02/07/2024 0926   HGB 13.3 02/07/2024 0926   HGB 14.7 04/05/2022 1217   HCT 39.9 02/07/2024 0926   PLT 190.0 02/07/2024 0926   PLT 220 04/05/2022 1217   MCV 86.0 02/07/2024 0926   MCH 29.6 04/05/2022 1217   MCHC 33.3 02/07/2024 0926   RDW 13.7 02/07/2024 0926   LYMPHSABS 1.3 02/07/2024 0926   MONOABS 0.5 02/07/2024 0926   EOSABS 0.3 02/07/2024 0926   BASOSABS 0.1 02/07/2024 0926    BMET    Component Value Date/Time   NA 141 02/07/2024 0926   K 4.4 02/07/2024 0926   CL 107 02/07/2024 0926   CO2 29 02/07/2024 0926   GLUCOSE 101 (H) 02/07/2024 0926   BUN 20 02/07/2024 0926   CREATININE 1.06 02/07/2024 0926   CREATININE 0.97 04/05/2022 1217   CALCIUM  9.3 02/07/2024 0926   GFRNONAA >60 04/05/2022 1217    BNP No results found for: BNP  ProBNP No results found for: PROBNP  Imaging: No results found.   Assessment & Plan:   1. Moderate persistent asthma, unspecified whether complicated (Primary)  2. OSA (obstructive sleep apnea)  Assessment and Plan Assessment & Plan Obstructive sleep apnea Mild obstructive sleep apnea managed with BiPAP therapy. Compliance with BiPAP usage reported, but no data available from SD card for compliance verification. Previous pressure settings were EPAP 6 and IPAP 10.5, effective in controlling sleep apnea. He reports improved breathing with BiPAP, especially during sinus issues. No significant snoring or frequent awakenings reported. Insurance compliance data required for  supply renewal. - Instructed to contact [employer] to enroll in [place] program for compliance data upload. - Advised to provide serial number and PIN from BiPAP machine to [employer] for enrollment. - If unable to enroll in [facility], instructed to bring BiPAP machine to [employer] for hard download. - Ordered CPAP supplies from [employer] for one year. - Documented verbal compliance with BiPAP usage for insurance purposes.   Almarie LELON Ferrari, NP 03/05/2024     [1]  Allergies Allergen Reactions   Shellfish Allergy Rash, Shortness Of Breath and Swelling   Shellfish Protein-Containing Drug Products Shortness Of Breath  [2]  Social History Tobacco Use  Smoking Status Never  Smokeless Tobacco Never   "

## 2024-03-06 ENCOUNTER — Encounter: Payer: Self-pay | Admitting: Primary Care

## 2024-03-06 ENCOUNTER — Ambulatory Visit: Admitting: Primary Care

## 2024-03-06 VITALS — BP 116/64 | HR 63 | Temp 97.6°F | Ht 69.0 in | Wt 191.2 lb

## 2024-03-06 DIAGNOSIS — G4733 Obstructive sleep apnea (adult) (pediatric): Secondary | ICD-10-CM

## 2024-03-06 DIAGNOSIS — J4540 Moderate persistent asthma, uncomplicated: Secondary | ICD-10-CM

## 2024-03-06 NOTE — Patient Instructions (Addendum)
" ° °  VISIT SUMMARY: During your visit, we discussed your history of sleep apnea and the use of your BiPAP machine. You mentioned that you find the BiPAP helpful, especially during sinus issues, and you are seeking to renew your CPAP supplies. We reviewed your SD card, but it contained no data, likely due to its age or software updates. We discussed steps to ensure compliance data is available for your insurance and to renew your supplies.  YOUR PLAN: -OBSTRUCTIVE SLEEP APNEA: Obstructive sleep apnea is a condition where your breathing repeatedly stops and starts during sleep due to blocked airways. You are managing this with a BiPAP machine, which helps keep your airways open. To renew your CPAP supplies, you need to contact [employer] to enroll in the [place] program for compliance data upload. Provide the serial number and PIN from your BiPAP machine to [employer] for enrollment. If you cannot enroll in [facility], bring your BiPAP machine to [employer] for a hard download of the data. We have ordered your CPAP supplies for one year and documented your verbal compliance with BiPAP usage for insurance purposes.  INSTRUCTIONS: Please contact [employer] to enroll in the [facility] program for compliance data upload. Provide the serial number and PIN from your BiPAP machine to [employer] for enrollment. If you are unable to enroll in [facility], bring your BiPAP machine to [employer] for a hard download of the data. Your CPAP supplies have been ordered for one year.   [employer] contact - [phone] "

## 2024-03-12 ENCOUNTER — Other Ambulatory Visit: Payer: Self-pay | Admitting: Internal Medicine

## 2024-03-12 DIAGNOSIS — I10 Essential (primary) hypertension: Secondary | ICD-10-CM
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
## 2024-04-11 ENCOUNTER — Ambulatory Visit: Admitting: Neurology

## 2024-04-13 ENCOUNTER — Telehealth: Payer: Self-pay

## 2024-04-13 NOTE — Telephone Encounter (Signed)
 Copied from CRM #[id number]. Topic: Clinical - Order For Equipment >> Apr 13, 2024  2:45 PM Rilla B wrote: Reason for CRM: Patient saw Ms Hope on 12/23 and needed supplies.  [employer] said they never received the order for the supplies.  Please call patient @[phone].   [facility] can we follow up on order placed 03/06/2024? I do not see any notes where this was sent or received. Thanks

## 2024-04-16 ENCOUNTER — Other Ambulatory Visit: Payer: Self-pay | Admitting: Internal Medicine

## 2024-04-16 DIAGNOSIS — J4540 Moderate persistent asthma, uncomplicated: Secondary | ICD-10-CM

## 2024-04-17 NOTE — Telephone Encounter (Signed)
 Spoke with patient to inform him I will resemd his order to [employer] , follow up with them to make sure it was received and inform him of the outcome.  He voiced his understanding

## 2024-04-18 NOTE — Telephone Encounter (Signed)
 I have spoken with Arcanda at [facility] and the order for Cpap supplies has been received and will be sent for processing---I called patient and made him aware

## 2024-05-14 ENCOUNTER — Ambulatory Visit: Admitting: Internal Medicine

## 2024-05-24 ENCOUNTER — Ambulatory Visit: Admitting: Neurology

## 2025-01-09 ENCOUNTER — Ambulatory Visit

## 2025-02-15 ENCOUNTER — Encounter: Admitting: Internal Medicine
